# Patient Record
Sex: Male | Born: 1935
Health system: Southern US, Community
[De-identification: ages and names within clinical notes are randomized; demographics above are authoritative.]

## PROBLEM LIST (undated history)

## (undated) DIAGNOSIS — N189 Chronic kidney disease, unspecified: Secondary | ICD-10-CM

## (undated) DIAGNOSIS — I10 Essential (primary) hypertension: Secondary | ICD-10-CM

## (undated) DIAGNOSIS — J449 Chronic obstructive pulmonary disease, unspecified: Secondary | ICD-10-CM

---

## 1999-06-17 ENCOUNTER — Ambulatory Visit (HOSPITAL_COMMUNITY): Admission: RE | Admit: 1999-06-17 | Discharge: 1999-06-17 | Payer: Self-pay | Admitting: Radiation Oncology

## 1999-06-20 ENCOUNTER — Encounter: Admission: RE | Admit: 1999-06-20 | Discharge: 1999-09-18 | Payer: Self-pay | Admitting: Radiation Oncology

## 2000-03-19 ENCOUNTER — Inpatient Hospital Stay (HOSPITAL_COMMUNITY): Admission: EM | Admit: 2000-03-19 | Discharge: 2000-03-20 | Payer: Self-pay | Admitting: Cardiovascular Disease

## 2003-01-28 ENCOUNTER — Inpatient Hospital Stay (HOSPITAL_COMMUNITY): Admission: EM | Admit: 2003-01-28 | Discharge: 2003-01-31 | Payer: Self-pay | Admitting: Emergency Medicine

## 2003-02-06 ENCOUNTER — Encounter: Admission: RE | Admit: 2003-02-06 | Discharge: 2003-02-06 | Payer: Self-pay | Admitting: Internal Medicine

## 2003-02-13 ENCOUNTER — Encounter: Admission: RE | Admit: 2003-02-13 | Discharge: 2003-02-13 | Payer: Self-pay | Admitting: Internal Medicine

## 2003-04-02 ENCOUNTER — Encounter: Admission: RE | Admit: 2003-04-02 | Discharge: 2003-04-02 | Payer: Self-pay | Admitting: Internal Medicine

## 2003-04-04 ENCOUNTER — Emergency Department (HOSPITAL_COMMUNITY): Admission: EM | Admit: 2003-04-04 | Discharge: 2003-04-05 | Payer: Self-pay | Admitting: Emergency Medicine

## 2003-04-05 ENCOUNTER — Encounter: Admission: RE | Admit: 2003-04-05 | Discharge: 2003-04-05 | Payer: Self-pay | Admitting: Internal Medicine

## 2003-05-21 ENCOUNTER — Encounter: Admission: RE | Admit: 2003-05-21 | Discharge: 2003-05-21 | Payer: Self-pay | Admitting: Internal Medicine

## 2003-07-16 ENCOUNTER — Encounter: Admission: RE | Admit: 2003-07-16 | Discharge: 2003-07-16 | Payer: Self-pay | Admitting: Internal Medicine

## 2003-09-10 ENCOUNTER — Encounter: Admission: RE | Admit: 2003-09-10 | Discharge: 2003-09-10 | Payer: Self-pay | Admitting: Internal Medicine

## 2003-12-14 ENCOUNTER — Ambulatory Visit: Payer: Self-pay | Admitting: Internal Medicine

## 2004-04-23 ENCOUNTER — Ambulatory Visit: Payer: Self-pay | Admitting: Internal Medicine

## 2004-05-14 ENCOUNTER — Ambulatory Visit: Payer: Self-pay | Admitting: Internal Medicine

## 2004-07-16 ENCOUNTER — Ambulatory Visit: Payer: Self-pay | Admitting: Internal Medicine

## 2004-08-20 ENCOUNTER — Ambulatory Visit: Payer: Self-pay | Admitting: Internal Medicine

## 2005-02-02 ENCOUNTER — Ambulatory Visit: Payer: Self-pay | Admitting: Internal Medicine

## 2005-02-12 ENCOUNTER — Ambulatory Visit: Payer: Self-pay | Admitting: Internal Medicine

## 2005-02-20 ENCOUNTER — Ambulatory Visit: Payer: Self-pay | Admitting: Internal Medicine

## 2005-03-02 ENCOUNTER — Ambulatory Visit: Payer: Self-pay | Admitting: Internal Medicine

## 2005-05-15 ENCOUNTER — Ambulatory Visit: Payer: Self-pay | Admitting: Internal Medicine

## 2005-05-26 ENCOUNTER — Ambulatory Visit: Payer: Self-pay | Admitting: Hospitalist

## 2005-05-27 ENCOUNTER — Emergency Department (HOSPITAL_COMMUNITY): Admission: EM | Admit: 2005-05-27 | Discharge: 2005-05-27 | Payer: Self-pay | Admitting: Emergency Medicine

## 2005-06-24 ENCOUNTER — Ambulatory Visit: Payer: Self-pay | Admitting: Internal Medicine

## 2005-08-11 ENCOUNTER — Ambulatory Visit: Payer: Self-pay | Admitting: Internal Medicine

## 2005-08-18 ENCOUNTER — Ambulatory Visit: Payer: Self-pay | Admitting: Internal Medicine

## 2005-08-20 ENCOUNTER — Ambulatory Visit: Payer: Self-pay | Admitting: Internal Medicine

## 2005-11-11 ENCOUNTER — Ambulatory Visit: Payer: Self-pay | Admitting: Internal Medicine

## 2005-12-17 DIAGNOSIS — Z8546 Personal history of malignant neoplasm of prostate: Secondary | ICD-10-CM | POA: Insufficient documentation

## 2005-12-17 DIAGNOSIS — I10 Essential (primary) hypertension: Secondary | ICD-10-CM | POA: Insufficient documentation

## 2005-12-17 DIAGNOSIS — J4489 Other specified chronic obstructive pulmonary disease: Secondary | ICD-10-CM | POA: Insufficient documentation

## 2005-12-17 DIAGNOSIS — E119 Type 2 diabetes mellitus without complications: Secondary | ICD-10-CM | POA: Insufficient documentation

## 2005-12-17 DIAGNOSIS — G5600 Carpal tunnel syndrome, unspecified upper limb: Secondary | ICD-10-CM | POA: Insufficient documentation

## 2005-12-17 DIAGNOSIS — N259 Disorder resulting from impaired renal tubular function, unspecified: Secondary | ICD-10-CM | POA: Insufficient documentation

## 2005-12-17 DIAGNOSIS — J449 Chronic obstructive pulmonary disease, unspecified: Secondary | ICD-10-CM | POA: Insufficient documentation

## 2006-03-31 DIAGNOSIS — E663 Overweight: Secondary | ICD-10-CM | POA: Insufficient documentation

## 2006-03-31 DIAGNOSIS — E785 Hyperlipidemia, unspecified: Secondary | ICD-10-CM | POA: Insufficient documentation

## 2006-05-16 ENCOUNTER — Emergency Department (HOSPITAL_COMMUNITY): Admission: EM | Admit: 2006-05-16 | Discharge: 2006-05-16 | Payer: Self-pay | Admitting: Emergency Medicine

## 2006-06-04 ENCOUNTER — Emergency Department (HOSPITAL_COMMUNITY): Admission: EM | Admit: 2006-06-04 | Discharge: 2006-06-04 | Payer: Self-pay | Admitting: Emergency Medicine

## 2006-07-26 ENCOUNTER — Telehealth: Payer: Self-pay | Admitting: *Deleted

## 2006-08-16 ENCOUNTER — Telehealth: Payer: Self-pay | Admitting: *Deleted

## 2006-08-17 ENCOUNTER — Telehealth (INDEPENDENT_AMBULATORY_CARE_PROVIDER_SITE_OTHER): Payer: Self-pay | Admitting: *Deleted

## 2006-08-18 ENCOUNTER — Encounter (INDEPENDENT_AMBULATORY_CARE_PROVIDER_SITE_OTHER): Payer: Self-pay | Admitting: *Deleted

## 2007-12-22 ENCOUNTER — Ambulatory Visit (HOSPITAL_COMMUNITY): Admission: RE | Admit: 2007-12-22 | Discharge: 2007-12-22 | Payer: Self-pay | Admitting: Nephrology

## 2010-08-01 NOTE — Discharge Summary (Signed)
NAME:  Nicholas Swanson, Nicholas Swanson                          ACCOUNT NO.:  [id number]   MEDICAL RECORD NO.:  [id number]                   PATIENT TYPE:  INP   LOCATION:  [id number]                                 FACILITY:  [facility]   PHYSICIAN:  Jacquelynn Cree, M.D.                DATE OF BIRTH:  [birth year]   DATE OF ADMISSION:  01/28/2003  DATE OF DISCHARGE:  01/31/2003                                 DISCHARGE SUMMARY   DISCHARGE DIAGNOSES:  1. Chronic obstructive pulmonary disease with acute exacerbation.  2. Hypertension.  3. Type II diabetes mellitus.  4. Anemia.  5. History of prostate cancer.  6. Chronic renal insufficiency.   DISCHARGE MEDICATIONS:  1. Advair discus 250/50 one puff b.i.d.  2. Prednisone 50 mg p.o. q.d. times one more day.  3. Albuterol meter dose inhaler two puffs p.r.n. wheezing.  4. Atrovent meter dose inhaler q.6h. p.r.n.  5. Glucotrol XL q.d.  6. Hyzaar q.d.   BRIEF ADMISSION HISTORY AND PHYSICAL:  Nicholas Swanson is a 77 year old male who  awoke with shortness of breath the morning of admission.  As the morning  progressed, he had increasing shortness of breath, prompting his wife to  call EMS.  Upon arrival of EMS, the patient was too short of breath to  speak.  He was put on 15 liters of oxygen by mask and was felt to be moving  very little air and using his accessory muscles to breath.  He was  transported to the emergency department and admitted for treatment of acute  exacerbation of COPD.   PHYSICAL EXAMINATION:  VITAL SIGNS:  Temperature 97.5, pulse 104,  respirations 18, blood pressure 120/63, O2 saturation 100% on BiPAP.  GENERAL:  A well-developed African-American male in moderate respiratory  distress.  HEENT:  Normocephalic, atraumatic.  PERRL.  EOMI.  Oropharynx clear.  NECK:  Supple, trachea is midline.  There are no bruits or thyromegaly.  CHEST:  Distant breath sounds with poor air movement.  There are bilateral  expiratory wheezes.  HEART:  Tachycardiac  with regular rhythm.  ABDOMEN:  Soft, nontender, nondistended, with normal active bowel sounds.  There were no masses.  EXTREMITIES:  Edema bilaterally.  NEUROLOGICAL:  The patient is alert and oriented times three.  Neurological  exam is nonfocal.   ADMISSION LABORATORY DATA:  Initial ABG revealed a pH of 7.291, PCO2 48.1,  PO2 397 with a bicarb of 24 on 100% oxygen.  His white count was 6.8,  hemoglobin 11.8, hematocrit 34.6, and platelet count 226.  PT was 13.2, INR  1.0, PTT 24, sodium was 133, potassium 4.7, chloride 108, bicarb 22, glucose  248, BUN 23, creatinine 1.4, calcium 8.3, total protein 6.5, albumin 3.4,  AST 15, ALT 15, alkaline phosphatase 70, total bilirubin 0.3.  Glycosylated  hemoglobin was 8.1.  Total CK 395, CKMB 5.9, troponin-I 0.03.  Brain  natriuretic peptide less than 30.  Cholesterol was 225, triglycerides 58,  HDL 58, LDL 155, TSH was 0.222, B12 363, ferritin 115.  Urinalysis was  unremarkable except for some glucosuria and trace hemoglobin.   HOSPITAL COURSE BY PROBLEM:  PROBLEM 1.  COPD exacerbation:  The patient was  admitted and supported with aggressive oxygen therapy including C-PAP.  He  was treated with IV steroids and around-the-clock frequent bronchodilator  therapy.  Because his presentation was not consistent with pneumonia or  infection, he was not initially started on any antibiotics.  He had not been  on a long-acting bronchodilator or steroid inhaler prior to his admission  and these therapies were initiated while in the hospital.  He rapidly  improved with Solu-Medrol therapy.  He was given a pneumococcal vaccine on  January 30, 2003.  His return to baseline was achieved by January 31, 2003, and he was sent home in good condition.  PROBLEM 2.  Hypertension:  The patient was admitted on both an angiotensin  receptor blocker and an angiotensin enzyme inhibitor.  His regimen was  changed secondary to his elevation of creatinine.  He was,  therefore,  started on Norvasc and Lotensin to achieve blood pressure control.  PROBLEM 3.  Renal insufficiency:  It was felt that this may be exacerbated  by his medical regimen for hypertension.  His medicines were changed and he  will need to be followed on an outpatient basis to see if his creatinine  returns to a lower value.  PROBLEM 4.  Anemia:  The patient was not found to be iron deficient given  his normal ferritin level.  This will need to be worked up further on an  outpatient basis.  PROBLEM 5.  Low TSH:  Difficult to assess given his acute illness.  He will  need a follow up TSH drawn on an outpatient basis.  He was clinically not  hyperthyroid.  PROBLEM 6.  Diabetes:  The patient's glycemia control was suboptimal with a  hemoglobin A1c of 8.1.  It will be followed on an outpatient basis to  maximize his diabetic control.  PROBLEM 7.  Lipidemia:  The patient was advised on dietary measures.  He  will follow up in the outpatient clinic to see if statin therapy is  warranted.   DISCHARGE INSTRUCTIONS:  The patient was advised to consume a diabetic diet.  He will follow up in the outpatient clinic on February 06, 2003, at 9  o'clock for lab work and see Dr. Linus Swanson at 3:00 PM on February 13, 2003, for  hospital follow up.                                                Jacquelynn Cree, M.D.    CR/MEDQ  D:  06/06/2003  T:  06/07/2003  Job:  [id number]

## 2010-08-01 NOTE — Discharge Summary (Signed)
. [facility]  Patient:    Nicholas Swanson, Nicholas Swanson                       MRN: [id number] Adm. Date:  [id number] Disc. Date: 03/20/00 Attending:  Berry, Jonathan Swanson Dictator:   Nicholas Swanson. Dorene Ar, F.N.P.C. CC:         Nicholas Simmers, M.D., c/o [facility], [place], [place]   Discharge Summary  DISCHARGE DIAGNOSES: 1. Chest pain, nonspecific. 2. Hypertension, uncontrolled, now improved. 3. Non-insulin-dependent diabetic. 4. Chronic obstructive pulmonary disease. 5. History of prostate cancer.  DISCHARGE CONDITION:  Improved.  PROCEDURES:  None.  DISCHARGE MEDICATIONS: 1. GlucoVance 5/500 one twice a day before meals. 2. ______ 20-12.5 one daily. 3. Maxair inhaler as needed. 4. Uniphyl 400 mg one at bedtime. 5. Norvasc 5 mg one daily.  This is new.  DISCHARGE INSTRUCTIONS: 1. No strenuous activity until treadmill test completed. 2. Low-fat, low-salt, diabetic diet. 3. The night before treadmill, do not eat or drink after midnight and do not    take diabetic medicine until after the test. 4. Call Nicholas Swanson office Monday for an appointment for stress Cardiolite and    a follow-up appointment with Nicholas Swanson.  HISTORY OF PRESENT ILLNESS:  A 77 year old married black male patient of Dr.  Laneta Simmers presented to [facility] hospital on March 19, 2000, after having chest pain at 9 a.m. lasting maybe 10 or 15 minutes, was more of a numbness and tingling in his left anterior chest.  Mild nausea associated and his wife states he was very weak or not as active over the last couple of days.  Due to hypertension, he was placed on nitroglycerin drip and transferred to [facility].  Patient has no prior cardiac history, no other associated symptoms.  Patient felt he was not cardiac, he felt fine and really only presented to the emergency room secondary to his wifes insistence.  PAST MEDICAL HISTORY: 1. Cardiac:  No prior history. 2. COPD with  disability from [facility] in 1991 secondary to his COPD. 3. Hypertension for two years. 4. Non-insulin-dependent diabetes mellitus, type 2. 5. History of prostate cancer.  ALLERGIES:  No known allergies.  OUTPATIENT MEDICATIONS: 1. GlucoVance 5/500 twice a day. 2. ______ 20/12.5 daily. 3. Maxair inhaler p.r.n. 4. Uniphyl 400 at h.s.  SOCIAL HISTORY:  No tobacco for six years.  Disabled from [facility] as stated.  He is married with seven children, 13 grandchildren, eight great grandchildren.  FAMILY HISTORY:  No cardiac disease.  REVIEW OF SYSTEMS:  Musculoskeletal:  Has not been shoveling snow, but he did carry a 50-pound bag of bird seed the day of the discomfort.  Cardiovascular: Positive for hypertension, no coronary disease.  Lungs:  History of COPD and tobacco use.  GI:  Some mild nausea today but no diarrhea, no constipation, and no melena.  GU:  History of prostate cancer.  Endocrine:  Positive diabetes, negative thyroid disease.  PHYSICAL EXAMINATION AT DISCHARGE:  VITAL SIGNS:  Blood pressure 140/72, pulse 86, respirations 22, temperature 99.6, oxygen saturation on room air 96%.  GENERAL:  Alert, oriented black male in no acute distress.  NECK:  Supple, midline trachea without JVD, bruit, or thyromegaly.  LUNGS:  Clear to auscultation and percussion.  ABDOMEN:  Soft, positive bowel sounds, nontender.  SKIN:  Warm and dry without jaundice.  CARDIAC:  Regular rate and rhythm.  No murmur, gallop, rub, or click.  EXTREMITIES:  Moves all extremities x 4.  No ankle edema.  LABORATORY VALUES:  Initial labs at [facility]:  Hemoglobin 13, hematocrit 37, platelets 254, WBC 8.5, neutrophils 48, lymphs 37.  PTT 23, pro time 12, INR 0.9, troponin 0.0, CK-MB 6.  Theophylline 1.2, sodium 137, potassium 4.5, BUN 18, creatinine 1.6, glucose 172.  LFTs were normal.  EKG:  Sinus rhythm, rate of 92.  Please note, lipid panel is pending.  Follow-up labs:  Hemoglobin 12,  hematocrit 34, creatinine down to 1.4, potassium 4.3, glucose 153, WBC 7.8.  EKG without acute changes.  HOSPITAL COURSE:  Nicholas Swanson was transferred from [facility] with chest pain and uncontrolled hypertension.  He was admitted to 3700 telemetry. Cardiac enzymes were done which were all negative.  Lipid panel was pending. He was on IV nitroglycerin for blood pressure control.  Norvasc was added with first dose given the night of admission.  By the next morning, nitroglycerin was weaned and discontinued.  Blood pressure was controlled at 140/72.  He was able to ambulate in the hall without difficulty.  He was discharged home by Dr. Claiborne Swanson and would follow up as an outpatient.  He will have a stress test done next week. DD:  03/20/00 TD:  03/20/00 Job: [id number]

## 2011-04-12 ENCOUNTER — Other Ambulatory Visit: Payer: Self-pay

## 2011-04-12 ENCOUNTER — Encounter (HOSPITAL_COMMUNITY): Payer: Self-pay | Admitting: *Deleted

## 2011-04-12 ENCOUNTER — Emergency Department (HOSPITAL_COMMUNITY)
Admission: EM | Admit: 2011-04-12 | Discharge: 2011-04-12 | Disposition: A | Discharge status: Home / Self Care | Payer: Medicare Other | Attending: Emergency Medicine | Admitting: Emergency Medicine

## 2011-04-12 ENCOUNTER — Emergency Department (HOSPITAL_COMMUNITY): Payer: Medicare Other

## 2011-04-12 DIAGNOSIS — J209 Acute bronchitis, unspecified: Secondary | ICD-10-CM | POA: Insufficient documentation

## 2011-04-12 DIAGNOSIS — J9801 Acute bronchospasm: Secondary | ICD-10-CM

## 2011-04-12 DIAGNOSIS — I10 Essential (primary) hypertension: Secondary | ICD-10-CM | POA: Insufficient documentation

## 2011-04-12 DIAGNOSIS — E119 Type 2 diabetes mellitus without complications: Secondary | ICD-10-CM | POA: Insufficient documentation

## 2011-04-12 DIAGNOSIS — J40 Bronchitis, not specified as acute or chronic: Secondary | ICD-10-CM

## 2011-04-12 HISTORY — DX: Chronic obstructive pulmonary disease, unspecified: J44.9

## 2011-04-12 HISTORY — DX: Essential (primary) hypertension: I10

## 2011-04-12 LAB — CBC
HCT: 33.4 % — ABNORMAL LOW (ref 39.0–52.0)
Hemoglobin: 11.2 g/dL — ABNORMAL LOW (ref 13.0–17.0)
MCH: 30.1 pg (ref 26.0–34.0)
MCHC: 33.5 g/dL (ref 30.0–36.0)
MCV: 89.8 fL (ref 78.0–100.0)
RDW: 13.7 % (ref 11.5–15.5)

## 2011-04-12 LAB — COMPREHENSIVE METABOLIC PANEL
AST: 17 U/L (ref 0–37)
Albumin: 4 g/dL (ref 3.5–5.2)
BUN: 20 mg/dL (ref 6–23)
Calcium: 9.6 mg/dL (ref 8.4–10.5)
Chloride: 100 mEq/L (ref 96–112)
Creatinine, Ser: 1.54 mg/dL — ABNORMAL HIGH (ref 0.50–1.35)
GFR calc non Af Amer: 41 mL/min — ABNORMAL LOW (ref >90)
Total Bilirubin: 0.5 mg/dL (ref 0.3–1.2)

## 2011-04-12 LAB — POCT I-STAT TROPONIN I: Troponin i, poc: 0.01 ng/mL (ref 0.00–0.08)

## 2011-04-12 LAB — DIFFERENTIAL
Basophils Absolute: 0 10*3/uL (ref 0.0–0.1)
Basophils Relative: 1 % (ref 0–1)
Eosinophils Relative: 7 % — ABNORMAL HIGH (ref 0–5)
Monocytes Absolute: 0.9 10*3/uL (ref 0.1–1.0)
Monocytes Relative: 11 % (ref 3–12)
Neutro Abs: 5.4 10*3/uL (ref 1.7–7.7)

## 2011-04-12 MED ORDER — IPRATROPIUM BROMIDE 0.02 % IN SOLN
0.5000 mg | Freq: Once | RESPIRATORY_TRACT | Status: AC
  Administered 2011-04-12: 0.5 mg via RESPIRATORY_TRACT
  Filled 2011-04-12: qty 2.5

## 2011-04-12 MED ORDER — ALBUTEROL SULFATE (5 MG/ML) 0.5% IN NEBU
5.0000 mg | INHALATION_SOLUTION | Freq: Once | RESPIRATORY_TRACT | Status: AC
  Administered 2011-04-12: 5 mg via RESPIRATORY_TRACT
  Filled 2011-04-12: qty 1

## 2011-04-12 MED ORDER — METHYLPREDNISOLONE SODIUM SUCC 125 MG IJ SOLR
125.0000 mg | Freq: Once | INTRAMUSCULAR | Status: AC
  Administered 2011-04-12: 125 mg via INTRAVENOUS
  Filled 2011-04-12: qty 2

## 2011-04-12 MED ORDER — PREDNISONE 10 MG PO TABS: 20.0000 mg | ORAL_TABLET | Freq: Every day | ORAL | Status: DC

## 2011-04-12 MED ORDER — AZITHROMYCIN 250 MG PO TABS: ORAL_TABLET | ORAL | Status: DC

## 2011-04-12 MED ORDER — IPRATROPIUM BROMIDE 0.02 % IN SOLN
0.5000 mg | Freq: Once | RESPIRATORY_TRACT | Status: AC
Start: 2011-04-12 — End: 2011-04-12
  Administered 2011-04-12: 0.5 mg via RESPIRATORY_TRACT
  Filled 2011-04-12: qty 2.5

## 2011-04-12 NOTE — ED Provider Notes (Cosign Needed)
History   This chart was scribed for Maudry Diego, MD by Kathreen Cornfield. The patient was seen in room APA14/APA14 and the patient's care was started at 4:03PM.    CSN: [id number]  Arrival date & time 04/12/11  1118   First MD Initiated Contact with Patient 04/12/11 1600      Chief Complaint  Patient presents with  . Shortness of Breath  . Cough    (Consider location/radiation/quality/duration/timing/severity/associated sxs/prior treatment) Patient is a 78 y.o. male presenting with shortness of breath and cough. The history is provided by the patient. No language interpreter was used.  Shortness of Breath  The current episode started yesterday. The onset was sudden. The problem occurs rarely. The problem has been unchanged. The problem is moderate. The symptoms are relieved by rest. The symptoms are aggravated by activity. Associated symptoms include cough, shortness of breath and wheezing. Pertinent negatives include no chest pain, no fever and no rhinorrhea. The Heimlich maneuver was not attempted. Recently, medical care has been given at this facility.  Cough This is a new problem. The current episode started yesterday. The problem has not changed since onset.There has been no fever. Associated symptoms include shortness of breath and wheezing. Pertinent negatives include no chest pain, no headaches and no rhinorrhea.    Past Medical History  Diagnosis Date  . Hypertension   . COPD (chronic obstructive pulmonary disease)   . Diabetes mellitus     History reviewed. No pertinent past surgical history.  History reviewed. No pertinent family history.  History  Substance Use Topics  . Smoking status: Former Research scientist (life sciences)  . Smokeless tobacco: Not on file  . Alcohol Use: No    10 Systems reviewed and are negative for acute change except as noted in the HPI.   Review of Systems  Constitutional: Negative for fever and fatigue.  HENT: Negative for congestion, rhinorrhea, sinus pressure  and ear discharge.   Eyes: Negative for discharge.  Respiratory: Positive for cough, shortness of breath and wheezing.   Cardiovascular: Negative for chest pain.  Gastrointestinal: Negative for abdominal pain and diarrhea.  Genitourinary: Negative for frequency and hematuria.  Musculoskeletal: Negative for back pain.  Skin: Negative for rash.  Neurological: Negative for seizures and headaches.  Hematological: Negative.   Psychiatric/Behavioral: Negative for hallucinations.  All other systems reviewed and are negative.    Allergies  Ace inhibitors and Simvastatin  Home Medications   Current Outpatient Rx  Name Route Sig Dispense Refill  . AMLODIPINE BESYLATE 5 MG PO TABS Oral Take 5 mg by mouth daily.    . ASPIRIN EC 81 MG PO TBEC Oral Take 81 mg by mouth daily.    . ATORVASTATIN CALCIUM 40 MG PO TABS Oral Take 40 mg by mouth daily.    Marland Kitchen GLIPIZIDE ER 2.5 MG PO TB24 Oral Take 2.5 mg by mouth daily.    Marland Kitchen METFORMIN HCL 1000 MG PO TABS Oral Take 1,000 mg by mouth 2 (two) times daily with a meal.    . METOPROLOL TARTRATE 50 MG PO TABS Oral Take 50 mg by mouth daily.    Marland Kitchen TERAZOSIN HCL 5 MG PO CAPS Oral Take 5 mg by mouth at bedtime.    Marland Kitchen VALSARTAN-HYDROCHLOROTHIAZIDE 160-25 MG PO TABS Oral Take 1 tablet by mouth daily.      BP 154/78  Pulse 70  Temp(Src) 98.3 F (36.8 C) (Oral)  Resp 18  Ht 6\' 1"  (1.854 m)  Wt 198 lb (89.812 kg)  BMI 26.12  kg/m2  SpO2 99%  Physical Exam  Nursing note and vitals reviewed. Constitutional: He is oriented to person, place, and time. He appears well-developed.  HENT:  Head: Normocephalic and atraumatic.  Eyes: Conjunctivae and EOM are normal. No scleral icterus.  Neck: Neck supple. No thyromegaly present.  Cardiovascular: Normal rate and regular rhythm.  Exam reveals no gallop and no friction rub.   No murmur heard. Pulmonary/Chest: No stridor. He has wheezes (Moderate throughout.). He has no rales. He exhibits no tenderness.  Abdominal: He  exhibits no distension. There is no tenderness. There is no rebound.  Musculoskeletal: Normal range of motion. He exhibits no edema.  Lymphadenopathy:    He has no cervical adenopathy.  Neurological: He is oriented to person, place, and time. Coordination normal.  Skin: No rash noted. No erythema.  Psychiatric: He has a normal mood and affect. His behavior is normal.    ED Course  Procedures (including critical care time)  DIAGNOSTIC STUDIES: Oxygen Saturation is 99% on room air, normal by my interpretation.    COORDINATION OF CARE:   Results for orders placed during the hospital encounter of 04/12/11  CBC      Component Value Range   WBC 8.4  4.0 - 10.5 (K/uL)   RBC 3.72 (*) 4.22 - 5.81 (MIL/uL)   Hemoglobin 11.2 (*) 13.0 - 17.0 (g/dL)   HCT 33.4 (*) 39.0 - 52.0 (%)   MCV 89.8  78.0 - 100.0 (fL)   MCH 30.1  26.0 - 34.0 (pg)   MCHC 33.5  30.0 - 36.0 (g/dL)   RDW 13.7  11.5 - 15.5 (%)   Platelets 244  150 - 400 (K/uL)  DIFFERENTIAL      Component Value Range   Neutrophils Relative 64  43 - 77 (%)   Neutro Abs 5.4  1.7 - 7.7 (K/uL)   Lymphocytes Relative 18  12 - 46 (%)   Lymphs Abs 1.5  0.7 - 4.0 (K/uL)   Monocytes Relative 11  3 - 12 (%)   Monocytes Absolute 0.9  0.1 - 1.0 (K/uL)   Eosinophils Relative 7 (*) 0 - 5 (%)   Eosinophils Absolute 0.6  0.0 - 0.7 (K/uL)   Basophils Relative 1  0 - 1 (%)   Basophils Absolute 0.0  0.0 - 0.1 (K/uL)  COMPREHENSIVE METABOLIC PANEL      Component Value Range   Sodium 133 (*) 135 - 145 (mEq/L)   Potassium 4.9  3.5 - 5.1 (mEq/L)   Chloride 100  96 - 112 (mEq/L)   CO2 24  19 - 32 (mEq/L)   Glucose, Bld 96  70 - 99 (mg/dL)   BUN 20  6 - 23 (mg/dL)   Creatinine, Ser 1.54 (*) 0.50 - 1.35 (mg/dL)   Calcium 9.6  8.4 - 10.5 (mg/dL)   Total Protein 7.8  6.0 - 8.3 (g/dL)   Albumin 4.0  3.5 - 5.2 (g/dL)   AST 17  0 - 37 (U/L)   ALT 12  0 - 53 (U/L)   Alkaline Phosphatase 71  39 - 117 (U/L)   Total Bilirubin 0.5  0.3 - 1.2 (mg/dL)    GFR calc non Af Amer 41 (*) >90 (mL/min)   GFR calc Af Amer 48 (*) >90 (mL/min)  POCT I-STAT TROPONIN I      Component Value Range   Troponin i, poc 0.01  0.00 - 0.08 (ng/mL)   Comment 3  Dg Chest Portable 1 View  04/12/2011  *RADIOLOGY REPORT*  Clinical Data: Shortness of breath  PORTABLE CHEST - 1 VIEW  Comparison: 06/04/2006  Findings: The heart size and mediastinal contours are within normal limits.  Both lungs are clear.  The visualized skeletal structures are unremarkable.  IMPRESSION: Negative exam.  Original Report Authenticated By: Angelita Ingles, M.D.        MDM     4:02PM- EDP at bedside discusses treatment plan. 5:43PM- Recheck. EDP at bedside reports moderate relief, moderate wheezing. EDP discusses treatment plan. 6:45PM- Recheck. EDP at bedside discusses treatment plan. Pt improved with tx  The chart was scribed for me under my direct supervision.  I personally performed the history, physical, and medical decision making and all procedures in the evaluation of this patient.Maudry Diego, MD 04/12/11 [phone]

## 2011-04-12 NOTE — ED Notes (Signed)
Patient with no complaints at this time. Respirations even and unlabored. Skin warm/dry. Discharge instructions reviewed with patient at this time. Patient given opportunity to voice concerns/ask questions. IV removed per policy and band-aid applied to site. Patient discharged at this time and left Emergency Department with steady gait.  

## 2014-05-29 DIAGNOSIS — J449 Chronic obstructive pulmonary disease, unspecified: Secondary | ICD-10-CM | POA: Diagnosis not present

## 2014-05-29 DIAGNOSIS — C61 Malignant neoplasm of prostate: Secondary | ICD-10-CM | POA: Diagnosis not present

## 2014-05-29 DIAGNOSIS — E1122 Type 2 diabetes mellitus with diabetic chronic kidney disease: Secondary | ICD-10-CM | POA: Diagnosis not present

## 2014-05-29 DIAGNOSIS — I10 Essential (primary) hypertension: Secondary | ICD-10-CM | POA: Diagnosis not present

## 2014-06-12 DIAGNOSIS — D075 Carcinoma in situ of prostate: Secondary | ICD-10-CM | POA: Diagnosis not present

## 2014-06-12 DIAGNOSIS — I10 Essential (primary) hypertension: Secondary | ICD-10-CM | POA: Diagnosis not present

## 2014-06-12 DIAGNOSIS — E1122 Type 2 diabetes mellitus with diabetic chronic kidney disease: Secondary | ICD-10-CM | POA: Diagnosis not present

## 2014-06-22 DIAGNOSIS — E875 Hyperkalemia: Secondary | ICD-10-CM | POA: Diagnosis not present

## 2014-06-29 DIAGNOSIS — E875 Hyperkalemia: Secondary | ICD-10-CM | POA: Diagnosis not present

## 2014-07-10 DIAGNOSIS — E875 Hyperkalemia: Secondary | ICD-10-CM | POA: Diagnosis not present

## 2014-11-05 DIAGNOSIS — C61 Malignant neoplasm of prostate: Secondary | ICD-10-CM | POA: Diagnosis not present

## 2014-11-05 DIAGNOSIS — J449 Chronic obstructive pulmonary disease, unspecified: Secondary | ICD-10-CM | POA: Diagnosis not present

## 2014-11-05 DIAGNOSIS — E1122 Type 2 diabetes mellitus with diabetic chronic kidney disease: Secondary | ICD-10-CM | POA: Diagnosis not present

## 2014-11-05 DIAGNOSIS — I10 Essential (primary) hypertension: Secondary | ICD-10-CM | POA: Diagnosis not present

## 2015-03-05 DIAGNOSIS — N183 Chronic kidney disease, stage 3 (moderate): Secondary | ICD-10-CM | POA: Diagnosis not present

## 2015-03-05 DIAGNOSIS — E1122 Type 2 diabetes mellitus with diabetic chronic kidney disease: Secondary | ICD-10-CM | POA: Diagnosis not present

## 2015-03-05 DIAGNOSIS — J449 Chronic obstructive pulmonary disease, unspecified: Secondary | ICD-10-CM | POA: Diagnosis not present

## 2015-03-05 DIAGNOSIS — Z6827 Body mass index (BMI) 27.0-27.9, adult: Secondary | ICD-10-CM | POA: Diagnosis not present

## 2015-07-08 DIAGNOSIS — N183 Chronic kidney disease, stage 3 (moderate): Secondary | ICD-10-CM | POA: Diagnosis not present

## 2015-07-08 DIAGNOSIS — I10 Essential (primary) hypertension: Secondary | ICD-10-CM | POA: Diagnosis not present

## 2015-07-08 DIAGNOSIS — E1122 Type 2 diabetes mellitus with diabetic chronic kidney disease: Secondary | ICD-10-CM | POA: Diagnosis not present

## 2015-07-08 DIAGNOSIS — J449 Chronic obstructive pulmonary disease, unspecified: Secondary | ICD-10-CM | POA: Diagnosis not present

## 2016-01-27 DIAGNOSIS — J449 Chronic obstructive pulmonary disease, unspecified: Secondary | ICD-10-CM | POA: Diagnosis not present

## 2016-01-27 DIAGNOSIS — I10 Essential (primary) hypertension: Secondary | ICD-10-CM | POA: Diagnosis not present

## 2016-01-27 DIAGNOSIS — N183 Chronic kidney disease, stage 3 (moderate): Secondary | ICD-10-CM | POA: Diagnosis not present

## 2016-01-27 DIAGNOSIS — E1122 Type 2 diabetes mellitus with diabetic chronic kidney disease: Secondary | ICD-10-CM | POA: Diagnosis not present

## 2016-02-03 DIAGNOSIS — E875 Hyperkalemia: Secondary | ICD-10-CM | POA: Diagnosis not present

## 2016-02-26 DIAGNOSIS — E1122 Type 2 diabetes mellitus with diabetic chronic kidney disease: Secondary | ICD-10-CM | POA: Diagnosis not present

## 2016-02-26 DIAGNOSIS — E875 Hyperkalemia: Secondary | ICD-10-CM | POA: Diagnosis not present

## 2016-02-26 DIAGNOSIS — N183 Chronic kidney disease, stage 3 (moderate): Secondary | ICD-10-CM | POA: Diagnosis not present

## 2016-02-26 DIAGNOSIS — Z0000 Encounter for general adult medical examination without abnormal findings: Secondary | ICD-10-CM | POA: Diagnosis not present

## 2016-02-26 DIAGNOSIS — E876 Hypokalemia: Secondary | ICD-10-CM | POA: Diagnosis not present

## 2016-03-10 DIAGNOSIS — I10 Essential (primary) hypertension: Secondary | ICD-10-CM | POA: Diagnosis not present

## 2016-03-10 DIAGNOSIS — N183 Chronic kidney disease, stage 3 (moderate): Secondary | ICD-10-CM | POA: Diagnosis not present

## 2016-03-10 DIAGNOSIS — E1122 Type 2 diabetes mellitus with diabetic chronic kidney disease: Secondary | ICD-10-CM | POA: Diagnosis not present

## 2016-03-10 DIAGNOSIS — E875 Hyperkalemia: Secondary | ICD-10-CM | POA: Diagnosis not present

## 2016-03-24 DIAGNOSIS — E875 Hyperkalemia: Secondary | ICD-10-CM | POA: Diagnosis not present

## 2016-06-08 DIAGNOSIS — I10 Essential (primary) hypertension: Secondary | ICD-10-CM | POA: Diagnosis not present

## 2016-06-08 DIAGNOSIS — N183 Chronic kidney disease, stage 3 (moderate): Secondary | ICD-10-CM | POA: Diagnosis not present

## 2016-06-08 DIAGNOSIS — E1129 Type 2 diabetes mellitus with other diabetic kidney complication: Secondary | ICD-10-CM | POA: Diagnosis not present

## 2016-09-08 DIAGNOSIS — E118 Type 2 diabetes mellitus with unspecified complications: Secondary | ICD-10-CM | POA: Diagnosis not present

## 2016-09-08 DIAGNOSIS — I10 Essential (primary) hypertension: Secondary | ICD-10-CM | POA: Diagnosis not present

## 2016-09-08 DIAGNOSIS — C61 Malignant neoplasm of prostate: Secondary | ICD-10-CM | POA: Diagnosis not present

## 2016-09-08 DIAGNOSIS — J449 Chronic obstructive pulmonary disease, unspecified: Secondary | ICD-10-CM | POA: Diagnosis not present

## 2016-09-10 DIAGNOSIS — E118 Type 2 diabetes mellitus with unspecified complications: Secondary | ICD-10-CM | POA: Diagnosis not present

## 2016-09-10 DIAGNOSIS — I10 Essential (primary) hypertension: Secondary | ICD-10-CM | POA: Diagnosis not present

## 2016-12-07 DIAGNOSIS — C61 Malignant neoplasm of prostate: Secondary | ICD-10-CM | POA: Diagnosis not present

## 2016-12-07 DIAGNOSIS — E1122 Type 2 diabetes mellitus with diabetic chronic kidney disease: Secondary | ICD-10-CM | POA: Diagnosis not present

## 2016-12-07 DIAGNOSIS — Z23 Encounter for immunization: Secondary | ICD-10-CM | POA: Diagnosis not present

## 2016-12-07 DIAGNOSIS — N183 Chronic kidney disease, stage 3 (moderate): Secondary | ICD-10-CM | POA: Diagnosis not present

## 2016-12-07 DIAGNOSIS — I10 Essential (primary) hypertension: Secondary | ICD-10-CM | POA: Diagnosis not present

## 2018-03-22 DIAGNOSIS — I10 Essential (primary) hypertension: Secondary | ICD-10-CM | POA: Diagnosis not present

## 2018-03-22 DIAGNOSIS — E118 Type 2 diabetes mellitus with unspecified complications: Secondary | ICD-10-CM | POA: Diagnosis not present

## 2018-03-22 DIAGNOSIS — N183 Chronic kidney disease, stage 3 (moderate): Secondary | ICD-10-CM | POA: Diagnosis not present

## 2018-03-22 DIAGNOSIS — J449 Chronic obstructive pulmonary disease, unspecified: Secondary | ICD-10-CM | POA: Diagnosis not present

## 2018-08-10 ENCOUNTER — Other Ambulatory Visit: Payer: Self-pay

## 2018-08-10 NOTE — Patient Outreach (Signed)
[facility]) Care Management  08/10/2018  Nicholas Swanson June 27, 1930 [id number]   Medication Adherence call to [name] Compliant Voice message left with a call back number. [name] is showing past due on Glipizide Er 2.5 mg under [place].   [place] Management Direct Dial [phone]  Fax [phone] [email or web address]

## 2018-10-17 DIAGNOSIS — N183 Chronic kidney disease, stage 3 (moderate): Secondary | ICD-10-CM | POA: Diagnosis not present

## 2018-10-17 DIAGNOSIS — J449 Chronic obstructive pulmonary disease, unspecified: Secondary | ICD-10-CM | POA: Diagnosis not present

## 2018-10-17 DIAGNOSIS — E1169 Type 2 diabetes mellitus with other specified complication: Secondary | ICD-10-CM | POA: Diagnosis not present

## 2018-10-17 DIAGNOSIS — N182 Chronic kidney disease, stage 2 (mild): Secondary | ICD-10-CM | POA: Diagnosis not present

## 2018-10-17 DIAGNOSIS — I10 Essential (primary) hypertension: Secondary | ICD-10-CM | POA: Diagnosis not present

## 2018-10-17 DIAGNOSIS — Z7189 Other specified counseling: Secondary | ICD-10-CM | POA: Diagnosis not present

## 2018-12-22 DIAGNOSIS — Z03818 Encounter for observation for suspected exposure to other biological agents ruled out: Secondary | ICD-10-CM | POA: Diagnosis not present

## 2019-02-20 ENCOUNTER — Other Ambulatory Visit: Payer: Self-pay

## 2019-02-20 DIAGNOSIS — E1122 Type 2 diabetes mellitus with diabetic chronic kidney disease: Secondary | ICD-10-CM | POA: Diagnosis not present

## 2019-02-20 DIAGNOSIS — Z7189 Other specified counseling: Secondary | ICD-10-CM | POA: Diagnosis not present

## 2019-02-20 DIAGNOSIS — N183 Chronic kidney disease, stage 3 unspecified: Secondary | ICD-10-CM | POA: Diagnosis not present

## 2019-02-20 DIAGNOSIS — J449 Chronic obstructive pulmonary disease, unspecified: Secondary | ICD-10-CM | POA: Diagnosis not present

## 2019-02-20 DIAGNOSIS — I10 Essential (primary) hypertension: Secondary | ICD-10-CM | POA: Diagnosis not present

## 2019-02-20 NOTE — Patient Outreach (Signed)
[facility]) Care Management  02/20/2019  JAWAD WIACEK 1930/04/28 [id number]   Medication Adherence call to Mr. Pierre Bali Telephone call to Patient regarding Medication Adherence unable to reach patient. Mr. Maalouf is showing past due on Telmisartan 40 mg under [place].   [place] Management Direct Dial [phone]  Fax [phone] [email or web address]

## 2019-02-28 DIAGNOSIS — E876 Hypokalemia: Secondary | ICD-10-CM | POA: Diagnosis not present

## 2019-03-04 ENCOUNTER — Encounter (HOSPITAL_COMMUNITY): Payer: Self-pay | Admitting: Emergency Medicine

## 2019-03-04 ENCOUNTER — Other Ambulatory Visit: Payer: Self-pay

## 2019-03-04 ENCOUNTER — Emergency Department (HOSPITAL_COMMUNITY): Payer: Medicare Other

## 2019-03-04 ENCOUNTER — Emergency Department (HOSPITAL_COMMUNITY)
Admission: EM | Admit: 2019-03-04 | Discharge: 2019-03-04 | Disposition: A | Discharge status: Home / Self Care | Payer: Medicare Other | Attending: Emergency Medicine | Admitting: Emergency Medicine

## 2019-03-04 DIAGNOSIS — I10 Essential (primary) hypertension: Secondary | ICD-10-CM | POA: Diagnosis not present

## 2019-03-04 DIAGNOSIS — U071 COVID-19: Secondary | ICD-10-CM

## 2019-03-04 DIAGNOSIS — Z87891 Personal history of nicotine dependence: Secondary | ICD-10-CM | POA: Insufficient documentation

## 2019-03-04 DIAGNOSIS — Z7984 Long term (current) use of oral hypoglycemic drugs: Secondary | ICD-10-CM | POA: Insufficient documentation

## 2019-03-04 DIAGNOSIS — Z79899 Other long term (current) drug therapy: Secondary | ICD-10-CM | POA: Insufficient documentation

## 2019-03-04 DIAGNOSIS — Z7982 Long term (current) use of aspirin: Secondary | ICD-10-CM | POA: Insufficient documentation

## 2019-03-04 DIAGNOSIS — J449 Chronic obstructive pulmonary disease, unspecified: Secondary | ICD-10-CM | POA: Diagnosis not present

## 2019-03-04 DIAGNOSIS — R509 Fever, unspecified: Secondary | ICD-10-CM | POA: Diagnosis not present

## 2019-03-04 DIAGNOSIS — E119 Type 2 diabetes mellitus without complications: Secondary | ICD-10-CM | POA: Diagnosis not present

## 2019-03-04 DIAGNOSIS — R05 Cough: Secondary | ICD-10-CM | POA: Diagnosis not present

## 2019-03-04 LAB — COMPREHENSIVE METABOLIC PANEL
ALT: 15 U/L (ref 0–44)
AST: 27 U/L (ref 15–41)
Albumin: 3.5 g/dL (ref 3.5–5.0)
Alkaline Phosphatase: 47 U/L (ref 38–126)
Anion gap: 8 (ref 5–15)
BUN: 37 mg/dL — ABNORMAL HIGH (ref 8–23)
CO2: 25 mmol/L (ref 22–32)
Calcium: 8.2 mg/dL — ABNORMAL LOW (ref 8.9–10.3)
Chloride: 101 mmol/L (ref 98–111)
Creatinine, Ser: 3.19 mg/dL — ABNORMAL HIGH (ref 0.61–1.24)
GFR calc Af Amer: 19 mL/min — ABNORMAL LOW (ref >60)
GFR calc non Af Amer: 17 mL/min — ABNORMAL LOW (ref >60)
Glucose, Bld: 135 mg/dL — ABNORMAL HIGH (ref 70–99)
Potassium: 5.1 mmol/L (ref 3.5–5.1)
Sodium: 134 mmol/L — ABNORMAL LOW (ref 135–145)
Total Bilirubin: 0.6 mg/dL (ref 0.3–1.2)
Total Protein: 7.1 g/dL (ref 6.5–8.1)

## 2019-03-04 LAB — POC SARS CORONAVIRUS 2 AG -  ED: SARS Coronavirus 2 Ag: POSITIVE — AB

## 2019-03-04 LAB — URINALYSIS, ROUTINE W REFLEX MICROSCOPIC
Bacteria, UA: NONE SEEN
Bilirubin Urine: NEGATIVE
Glucose, UA: NEGATIVE mg/dL
Ketones, ur: NEGATIVE mg/dL
Leukocytes,Ua: NEGATIVE
Nitrite: NEGATIVE
Protein, ur: 100 mg/dL — AB
Specific Gravity, Urine: 1.013 (ref 1.005–1.030)
pH: 7 (ref 5.0–8.0)

## 2019-03-04 LAB — CBC
HCT: 30.5 % — ABNORMAL LOW (ref 39.0–52.0)
Hemoglobin: 9.8 g/dL — ABNORMAL LOW (ref 13.0–17.0)
MCH: 30.2 pg (ref 26.0–34.0)
MCHC: 32.1 g/dL (ref 30.0–36.0)
MCV: 94.1 fL (ref 80.0–100.0)
Platelets: 208 10*3/uL (ref 150–400)
RBC: 3.24 MIL/uL — ABNORMAL LOW (ref 4.22–5.81)
RDW: 13.9 % (ref 11.5–15.5)
WBC: 9.8 10*3/uL (ref 4.0–10.5)
nRBC: 0 % (ref 0.0–0.2)

## 2019-03-04 LAB — DIFFERENTIAL
Abs Immature Granulocytes: 0.06 10*3/uL (ref 0.00–0.07)
Basophils Absolute: 0 10*3/uL (ref 0.0–0.1)
Basophils Relative: 0 %
Eosinophils Absolute: 0 10*3/uL (ref 0.0–0.5)
Eosinophils Relative: 0 %
Immature Granulocytes: 1 %
Lymphocytes Relative: 5 %
Lymphs Abs: 0.5 10*3/uL — ABNORMAL LOW (ref 0.7–4.0)
Monocytes Absolute: 0.8 10*3/uL (ref 0.1–1.0)
Monocytes Relative: 8 %
Neutro Abs: 8.5 10*3/uL — ABNORMAL HIGH (ref 1.7–7.7)
Neutrophils Relative %: 86 %

## 2019-03-04 MED ORDER — ACETAMINOPHEN 325 MG PO TABS
650.0000 mg | ORAL_TABLET | Freq: Once | ORAL | Status: AC | PRN
  Administered 2019-03-04: 650 mg via ORAL
  Filled 2019-03-04: qty 2

## 2019-03-04 NOTE — ED Provider Notes (Signed)
[facility] EMERGENCY DEPARTMENT Provider Note   CSN: [id number] Arrival date & time: 03/04/19  1039     History Chief Complaint  Patient presents with  . Fever    Nicholas Swanson is a 85 y.o. male.  Patient presents with fever cough and aches.   Cough Cough characteristics:  Non-productive Sputum characteristics:  Nondescript Severity:  Moderate Onset quality:  Sudden Timing:  Constant Progression:  Worsening Chronicity:  New Smoker: no   Context: not animal exposure   Relieved by:  Nothing Worsened by:  Nothing Associated symptoms: fever   Associated symptoms: no chest pain, no eye discharge, no headaches and no rash        Past Medical History:  Diagnosis Date  . COPD (chronic obstructive pulmonary disease) ([place])   . Diabetes mellitus   . Hypertension     Patient Active Problem List   Diagnosis Date Noted  . HYPERLIPIDEMIA 03/31/2006  . OVERWEIGHT 03/31/2006  . DIABETES MELLITUS, TYPE II 12/17/2005  . CARPAL TUNNEL SYNDROME 12/17/2005  . HYPERTENSION 12/17/2005  . Chronic airway obstruction, not elsewhere classified 12/17/2005  . RENAL INSUFFICIENCY 12/17/2005  . PROSTATE CANCER, HX OF 12/17/2005    History reviewed. No pertinent surgical history.     History reviewed. No pertinent family history.  Social History   Tobacco Use  . Smoking status: Former Smoker    Types: Cigarettes  . Smokeless tobacco: Never Used  Substance Use Topics  . Alcohol use: No  . Drug use: No    Home Medications Prior to Admission medications   Medication Sig Start Date End Date Taking? Authorizing Provider  amLODipine (NORVASC) 5 MG tablet Take 5 mg by mouth daily.    [provider]  aspirin EC 81 MG tablet Take 81 mg by mouth daily.    [provider]  atorvastatin (LIPITOR) 40 MG tablet Take 40 mg by mouth daily.    [provider]  azithromycin (ZITHROMAX) 250 MG tablet Take 2 tablets initially then one each day 04/12/11    Milton Ferguson, MD  glipiZIDE (GLUCOTROL XL) 2.5 MG 24 hr tablet Take 2.5 mg by mouth daily.    [provider]  metFORMIN (GLUCOPHAGE) 1000 MG tablet Take 1,000 mg by mouth 2 (two) times daily with a meal.    [provider]  metoprolol (LOPRESSOR) 50 MG tablet Take 50 mg by mouth daily.    [provider]  predniSONE (DELTASONE) 10 MG tablet Take 2 tablets (20 mg total) by mouth daily. 04/12/11   Milton Ferguson, MD  terazosin (HYTRIN) 5 MG capsule Take 5 mg by mouth at bedtime.    [provider]  valsartan-hydrochlorothiazide (DIOVAN-HCT) 160-25 MG per tablet Take 1 tablet by mouth daily.    [provider]    Allergies    Ace inhibitors and Simvastatin  Review of Systems   Review of Systems  Constitutional: Positive for fever. Negative for appetite change and fatigue.  HENT: Negative for congestion, ear discharge and sinus pressure.   Eyes: Negative for discharge.  Respiratory: Positive for cough.   Cardiovascular: Negative for chest pain.  Gastrointestinal: Negative for abdominal pain and diarrhea.  Genitourinary: Negative for frequency and hematuria.  Musculoskeletal: Negative for back pain.  Skin: Negative for rash.  Neurological: Negative for seizures and headaches.  Psychiatric/Behavioral: Negative for hallucinations.    Physical Exam Updated Vital Signs BP (!) 137/59   Pulse 84   Temp (S) (!) 102.1 F (38.9 C) (Oral)  Resp 19   Ht 6\' 1"  (1.854 m)   Wt 83 kg   SpO2 93%   BMI 24.14 kg/m   Physical Exam Vitals and nursing note reviewed.  Constitutional:      Appearance: He is well-developed.  HENT:     Head: Normocephalic.     Nose: Nose normal.  Eyes:     General: No scleral icterus.    Conjunctiva/sclera: Conjunctivae normal.  Neck:     Thyroid: No thyromegaly.  Cardiovascular:     Rate and Rhythm: Normal rate and regular rhythm.     Heart sounds: No murmur. No friction rub. No gallop.   Pulmonary:      Breath sounds: No stridor. No wheezing or rales.  Chest:     Chest wall: No tenderness.  Abdominal:     General: There is no distension.     Tenderness: There is no abdominal tenderness. There is no rebound.  Musculoskeletal:        General: Normal range of motion.     Cervical back: Neck supple.  Lymphadenopathy:     Cervical: No cervical adenopathy.  Skin:    Findings: No erythema or rash.  Neurological:     Mental Status: He is alert and oriented to person, place, and time.     Motor: No abnormal muscle tone.     Coordination: Coordination normal.  Psychiatric:        Behavior: Behavior normal.     ED Results / Procedures / Treatments   Labs (all labs ordered are listed, but only abnormal results are displayed) Labs Reviewed  CBC - Abnormal; Notable for the following components:      Result Value   RBC 3.24 (*)    Hemoglobin 9.8 (*)    HCT 30.5 (*)    All other components within normal limits  COMPREHENSIVE METABOLIC PANEL - Abnormal; Notable for the following components:   Sodium 134 (*)    Glucose, Bld 135 (*)    BUN 37 (*)    Creatinine, Ser 3.19 (*)    Calcium 8.2 (*)    GFR calc non Af Amer 17 (*)    GFR calc Af Amer 19 (*)    All other components within normal limits  URINALYSIS, ROUTINE W REFLEX MICROSCOPIC - Abnormal; Notable for the following components:   Hgb urine dipstick SMALL (*)    Protein, ur 100 (*)    All other components within normal limits  DIFFERENTIAL - Abnormal; Notable for the following components:   Neutro Abs 8.5 (*)    Lymphs Abs 0.5 (*)    All other components within normal limits  POC SARS CORONAVIRUS 2 AG -  ED - Abnormal; Notable for the following components:   SARS Coronavirus 2 Ag POSITIVE (*)    All other components within normal limits    EKG None  Radiology DG Chest Portable 1 View  Result Date: 03/04/2019 CLINICAL DATA:  Cough fever chills. EXAM: PORTABLE CHEST 1 VIEW COMPARISON:  04/12/2011 FINDINGS:  Cardiomediastinal contours are stable. Linear opacities are seen at the lung bases. Lungs are otherwise clear. No signs of pleural effusion or dense consolidation. No acute bone finding. IMPRESSION: Basilar atelectasis without acute cardiopulmonary disease. Electronically Signed   By: Zetta Bills M.D.   On: 03/04/2019 12:42    Procedures Procedures (including critical care time)  Medications Ordered in ED Medications  acetaminophen (TYLENOL) tablet 650 mg (650 mg Oral Given 03/04/19 1129)    ED Course  I have reviewed the triage vital signs and the nursing notes.  Pertinent labs & imaging results that were available during my care of the patient were reviewed by me and considered in my medical decision making (see chart for details).    MDM Rules/Calculators/A&P                      Patient is positive for Covid.  Patient is nontoxic and not hypoxic.  He will be discharged home and told to use Tylenol fluids and use his inhaler and follow-up with his PCP if any problem Final Clinical Impression(s) / ED Diagnoses Final diagnoses:  COVID-19    Rx / DC Orders ED Discharge Orders    None       Milton Ferguson, MD 03/04/19 1303

## 2019-03-04 NOTE — Discharge Instructions (Addendum)
Take Tylenol for fever drink plenty of fluids use your inhaler for any shortness of breath and follow-up with your doctor in the next week or 2.  Return if getting worse

## 2019-03-04 NOTE — ED Triage Notes (Signed)
Patient c/o fever, chills, fatigue, and urinary incontinency that started yesterday. Per patient highest temp at home 102. Denies taking any medication for fever.Denies nausea, vomiting, or diarrhea. Per patient "always has a cough."

## 2019-03-07 ENCOUNTER — Telehealth: Payer: Self-pay | Admitting: Nurse Practitioner

## 2019-03-07 NOTE — Telephone Encounter (Signed)
Called to discuss with patient about Covid symptoms and the use of bamlanivimab, a monoclonal antibody infusion for those with mild to moderate Covid symptoms and at a high risk of hospitalization.  Pt is qualified for this infusion at the [facility] infusion center due to Age > 65   Message left to call back, person who answered took message.

## 2019-03-11 ENCOUNTER — Inpatient Hospital Stay (HOSPITAL_COMMUNITY)
Admission: EM | Admit: 2019-03-11 | Discharge: 2019-03-20 | DRG: 177 | Disposition: A | Discharge status: Home Health | Payer: Medicare Other | Attending: Family Medicine | Admitting: Family Medicine

## 2019-03-11 ENCOUNTER — Other Ambulatory Visit: Payer: Self-pay

## 2019-03-11 ENCOUNTER — Emergency Department (HOSPITAL_COMMUNITY): Payer: Medicare Other

## 2019-03-11 ENCOUNTER — Encounter (HOSPITAL_COMMUNITY): Payer: Self-pay

## 2019-03-11 DIAGNOSIS — N1831 Chronic kidney disease, stage 3a: Secondary | ICD-10-CM | POA: Diagnosis not present

## 2019-03-11 DIAGNOSIS — E875 Hyperkalemia: Secondary | ICD-10-CM | POA: Diagnosis not present

## 2019-03-11 DIAGNOSIS — Z888 Allergy status to other drugs, medicaments and biological substances status: Secondary | ICD-10-CM

## 2019-03-11 DIAGNOSIS — R0902 Hypoxemia: Secondary | ICD-10-CM

## 2019-03-11 DIAGNOSIS — R7989 Other specified abnormal findings of blood chemistry: Secondary | ICD-10-CM | POA: Diagnosis not present

## 2019-03-11 DIAGNOSIS — J439 Emphysema, unspecified: Secondary | ICD-10-CM | POA: Diagnosis not present

## 2019-03-11 DIAGNOSIS — J159 Unspecified bacterial pneumonia: Secondary | ICD-10-CM | POA: Diagnosis present

## 2019-03-11 DIAGNOSIS — J449 Chronic obstructive pulmonary disease, unspecified: Secondary | ICD-10-CM | POA: Diagnosis not present

## 2019-03-11 DIAGNOSIS — U071 COVID-19: Secondary | ICD-10-CM | POA: Diagnosis not present

## 2019-03-11 DIAGNOSIS — I10 Essential (primary) hypertension: Secondary | ICD-10-CM | POA: Diagnosis not present

## 2019-03-11 DIAGNOSIS — E785 Hyperlipidemia, unspecified: Secondary | ICD-10-CM | POA: Diagnosis not present

## 2019-03-11 DIAGNOSIS — J1282 Pneumonia due to coronavirus disease 2019: Secondary | ICD-10-CM | POA: Diagnosis not present

## 2019-03-11 DIAGNOSIS — R791 Abnormal coagulation profile: Secondary | ICD-10-CM | POA: Diagnosis not present

## 2019-03-11 DIAGNOSIS — N289 Disorder of kidney and ureter, unspecified: Secondary | ICD-10-CM | POA: Diagnosis not present

## 2019-03-11 DIAGNOSIS — E1122 Type 2 diabetes mellitus with diabetic chronic kidney disease: Secondary | ICD-10-CM | POA: Diagnosis not present

## 2019-03-11 DIAGNOSIS — J9601 Acute respiratory failure with hypoxia: Secondary | ICD-10-CM | POA: Diagnosis present

## 2019-03-11 DIAGNOSIS — Z8249 Family history of ischemic heart disease and other diseases of the circulatory system: Secondary | ICD-10-CM | POA: Diagnosis not present

## 2019-03-11 DIAGNOSIS — J1289 Other viral pneumonia: Secondary | ICD-10-CM | POA: Diagnosis not present

## 2019-03-11 DIAGNOSIS — R0602 Shortness of breath: Secondary | ICD-10-CM | POA: Diagnosis not present

## 2019-03-11 DIAGNOSIS — E1165 Type 2 diabetes mellitus with hyperglycemia: Secondary | ICD-10-CM | POA: Diagnosis present

## 2019-03-11 DIAGNOSIS — R918 Other nonspecific abnormal finding of lung field: Secondary | ICD-10-CM | POA: Diagnosis not present

## 2019-03-11 DIAGNOSIS — Z743 Need for continuous supervision: Secondary | ICD-10-CM | POA: Diagnosis not present

## 2019-03-11 DIAGNOSIS — N185 Chronic kidney disease, stage 5: Secondary | ICD-10-CM | POA: Diagnosis present

## 2019-03-11 DIAGNOSIS — N183 Chronic kidney disease, stage 3 unspecified: Secondary | ICD-10-CM | POA: Diagnosis present

## 2019-03-11 DIAGNOSIS — I129 Hypertensive chronic kidney disease with stage 1 through stage 4 chronic kidney disease, or unspecified chronic kidney disease: Secondary | ICD-10-CM | POA: Diagnosis not present

## 2019-03-11 DIAGNOSIS — N179 Acute kidney failure, unspecified: Secondary | ICD-10-CM | POA: Diagnosis not present

## 2019-03-11 DIAGNOSIS — J069 Acute upper respiratory infection, unspecified: Secondary | ICD-10-CM | POA: Diagnosis not present

## 2019-03-11 DIAGNOSIS — E1121 Type 2 diabetes mellitus with diabetic nephropathy: Secondary | ICD-10-CM | POA: Diagnosis not present

## 2019-03-11 LAB — PROCALCITONIN: Procalcitonin: >150 ng/mL

## 2019-03-11 LAB — CBC WITH DIFFERENTIAL/PLATELET
Abs Immature Granulocytes: 0.1 10*3/uL — ABNORMAL HIGH (ref 0.00–0.07)
Basophils Absolute: 0 10*3/uL (ref 0.0–0.1)
Basophils Relative: 0 %
Eosinophils Absolute: 0 10*3/uL (ref 0.0–0.5)
Eosinophils Relative: 0 %
HCT: 33.6 % — ABNORMAL LOW (ref 39.0–52.0)
Hemoglobin: 10.9 g/dL — ABNORMAL LOW (ref 13.0–17.0)
Immature Granulocytes: 1 %
Lymphocytes Relative: 13 %
Lymphs Abs: 1 10*3/uL (ref 0.7–4.0)
MCH: 29.6 pg (ref 26.0–34.0)
MCHC: 32.4 g/dL (ref 30.0–36.0)
MCV: 91.3 fL (ref 80.0–100.0)
Monocytes Absolute: 0.3 10*3/uL (ref 0.1–1.0)
Monocytes Relative: 5 %
Neutro Abs: 5.9 10*3/uL (ref 1.7–7.7)
Neutrophils Relative %: 81 %
Platelets: 347 10*3/uL (ref 150–400)
RBC: 3.68 MIL/uL — ABNORMAL LOW (ref 4.22–5.81)
RDW: 14.6 % (ref 11.5–15.5)
WBC: 7.3 10*3/uL (ref 4.0–10.5)
nRBC: 0 % (ref 0.0–0.2)

## 2019-03-11 LAB — TRIGLYCERIDES: Triglycerides: 126 mg/dL (ref <150)

## 2019-03-11 LAB — COMPREHENSIVE METABOLIC PANEL
ALT: 220 U/L — ABNORMAL HIGH (ref 0–44)
AST: 261 U/L — ABNORMAL HIGH (ref 15–41)
Albumin: 2.9 g/dL — ABNORMAL LOW (ref 3.5–5.0)
Alkaline Phosphatase: 86 U/L (ref 38–126)
Anion gap: 14 (ref 5–15)
BUN: 62 mg/dL — ABNORMAL HIGH (ref 8–23)
CO2: 17 mmol/L — ABNORMAL LOW (ref 22–32)
Calcium: 8 mg/dL — ABNORMAL LOW (ref 8.9–10.3)
Chloride: 105 mmol/L (ref 98–111)
Creatinine, Ser: 3.38 mg/dL — ABNORMAL HIGH (ref 0.61–1.24)
GFR calc Af Amer: 18 mL/min — ABNORMAL LOW (ref >60)
GFR calc non Af Amer: 16 mL/min — ABNORMAL LOW (ref >60)
Glucose, Bld: 138 mg/dL — ABNORMAL HIGH (ref 70–99)
Potassium: 5.4 mmol/L — ABNORMAL HIGH (ref 3.5–5.1)
Sodium: 136 mmol/L (ref 135–145)
Total Bilirubin: 1 mg/dL (ref 0.3–1.2)
Total Protein: 7.5 g/dL (ref 6.5–8.1)

## 2019-03-11 LAB — C-REACTIVE PROTEIN: CRP: 39 mg/dL — ABNORMAL HIGH (ref <1.0)

## 2019-03-11 LAB — LACTIC ACID, PLASMA
Lactic Acid, Venous: 1.6 mmol/L (ref 0.5–1.9)
Lactic Acid, Venous: 2.7 mmol/L (ref 0.5–1.9)

## 2019-03-11 LAB — CBG MONITORING, ED
Glucose-Capillary: 113 mg/dL — ABNORMAL HIGH (ref 70–99)
Glucose-Capillary: 129 mg/dL — ABNORMAL HIGH (ref 70–99)

## 2019-03-11 LAB — D-DIMER, QUANTITATIVE: D-Dimer, Quant: 1.68 ug/mL-FEU — ABNORMAL HIGH (ref 0.00–0.50)

## 2019-03-11 LAB — FERRITIN: Ferritin: 2385 ng/mL — ABNORMAL HIGH (ref 24–336)

## 2019-03-11 LAB — FIBRINOGEN: Fibrinogen: >800 mg/dL — ABNORMAL HIGH (ref 210–475)

## 2019-03-11 LAB — ABO/RH: ABO/RH(D): O POS

## 2019-03-11 LAB — LACTATE DEHYDROGENASE: LDH: 459 U/L — ABNORMAL HIGH (ref 98–192)

## 2019-03-11 MED ORDER — ASPIRIN EC 81 MG PO TBEC
81.0000 mg | DELAYED_RELEASE_TABLET | Freq: Every day | ORAL | Status: DC
  Administered 2019-03-12 – 2019-03-20 (×9): 81 mg via ORAL
  Filled 2019-03-11 (×9): qty 1

## 2019-03-11 MED ORDER — ACETAMINOPHEN 325 MG PO TABS
650.0000 mg | ORAL_TABLET | Freq: Four times a day (QID) | ORAL | Status: DC | PRN
  Administered 2019-03-18: 04:00:00 650 mg via ORAL
  Filled 2019-03-11: qty 2

## 2019-03-11 MED ORDER — METHYLPREDNISOLONE SODIUM SUCC 125 MG IJ SOLR: 125.0000 mg | Freq: Once | INTRAMUSCULAR | Status: DC

## 2019-03-11 MED ORDER — ACETAMINOPHEN 650 MG RE SUPP: 650.0000 mg | Freq: Four times a day (QID) | RECTAL | Status: DC | PRN

## 2019-03-11 MED ORDER — SODIUM CHLORIDE 0.9% FLUSH
3.0000 mL | INTRAVENOUS | Status: DC | PRN
  Administered 2019-03-16: 3 mL via INTRAVENOUS

## 2019-03-11 MED ORDER — METOPROLOL TARTRATE 50 MG PO TABS
50.0000 mg | ORAL_TABLET | Freq: Every day | ORAL | Status: DC
  Administered 2019-03-11 – 2019-03-12 (×2): 50 mg via ORAL
  Filled 2019-03-11 (×2): qty 1

## 2019-03-11 MED ORDER — METHYLPREDNISOLONE SODIUM SUCC 125 MG IJ SOLR
0.5000 mg/kg | Freq: Two times a day (BID) | INTRAMUSCULAR | Status: DC
  Administered 2019-03-11 – 2019-03-15 (×9): 41.25 mg via INTRAVENOUS
  Filled 2019-03-11 (×8): qty 2

## 2019-03-11 MED ORDER — INSULIN ASPART 100 UNIT/ML ~~LOC~~ SOLN
0.0000 [IU] | Freq: Every day | SUBCUTANEOUS | Status: DC
  Administered 2019-03-15: 2 [IU] via SUBCUTANEOUS

## 2019-03-11 MED ORDER — ALBUTEROL SULFATE HFA 108 (90 BASE) MCG/ACT IN AERS
4.0000 | INHALATION_SPRAY | Freq: Once | RESPIRATORY_TRACT | Status: AC
  Administered 2019-03-11: 4 via RESPIRATORY_TRACT
  Filled 2019-03-11: qty 6.7

## 2019-03-11 MED ORDER — SODIUM CHLORIDE 0.9% FLUSH
3.0000 mL | Freq: Two times a day (BID) | INTRAVENOUS | Status: DC
  Administered 2019-03-13 – 2019-03-20 (×15): 3 mL via INTRAVENOUS

## 2019-03-11 MED ORDER — INSULIN ASPART 100 UNIT/ML ~~LOC~~ SOLN
3.0000 [IU] | Freq: Three times a day (TID) | SUBCUTANEOUS | Status: DC
  Administered 2019-03-12 – 2019-03-17 (×15): 3 [IU] via SUBCUTANEOUS
  Filled 2019-03-11: qty 1

## 2019-03-11 MED ORDER — ONDANSETRON HCL 4 MG PO TABS: 4.0000 mg | ORAL_TABLET | Freq: Four times a day (QID) | ORAL | Status: DC | PRN

## 2019-03-11 MED ORDER — HYDROCOD POLST-CPM POLST ER 10-8 MG/5ML PO SUER: 5.0000 mL | Freq: Two times a day (BID) | ORAL | Status: DC | PRN

## 2019-03-11 MED ORDER — SODIUM CHLORIDE 0.9 % IV SOLN
1.0000 g | INTRAVENOUS | Status: AC
  Administered 2019-03-11 – 2019-03-15 (×5): 1 g via INTRAVENOUS
  Filled 2019-03-11: qty 1
  Filled 2019-03-11 (×2): qty 10
  Filled 2019-03-11 (×2): qty 1

## 2019-03-11 MED ORDER — GLIPIZIDE ER 2.5 MG PO TB24
2.5000 mg | ORAL_TABLET | Freq: Every day | ORAL | Status: DC
  Administered 2019-03-12 – 2019-03-15 (×4): 2.5 mg via ORAL
  Filled 2019-03-11 (×6): qty 1

## 2019-03-11 MED ORDER — ADULT MULTIVITAMIN W/MINERALS CH
1.0000 | ORAL_TABLET | Freq: Every day | ORAL | Status: DC
  Administered 2019-03-11 – 2019-03-20 (×10): 1 via ORAL
  Filled 2019-03-11 (×10): qty 1

## 2019-03-11 MED ORDER — THIAMINE HCL 100 MG PO TABS
100.0000 mg | ORAL_TABLET | Freq: Every day | ORAL | Status: DC
  Administered 2019-03-11 – 2019-03-20 (×10): 100 mg via ORAL
  Filled 2019-03-11 (×10): qty 1

## 2019-03-11 MED ORDER — SODIUM CHLORIDE 0.9 % IV BOLUS
1000.0000 mL | Freq: Once | INTRAVENOUS | Status: AC
  Administered 2019-03-11: 1000 mL via INTRAVENOUS

## 2019-03-11 MED ORDER — SODIUM CHLORIDE 0.9 % IV SOLN
250.0000 mL | INTRAVENOUS | Status: DC | PRN
  Administered 2019-03-12: 250 mL via INTRAVENOUS

## 2019-03-11 MED ORDER — ENOXAPARIN SODIUM 30 MG/0.3ML ~~LOC~~ SOLN
30.0000 mg | SUBCUTANEOUS | Status: DC
  Administered 2019-03-11 – 2019-03-19 (×9): 30 mg via SUBCUTANEOUS
  Filled 2019-03-11 (×10): qty 0.3

## 2019-03-11 MED ORDER — INSULIN ASPART 100 UNIT/ML ~~LOC~~ SOLN
0.0000 [IU] | Freq: Three times a day (TID) | SUBCUTANEOUS | Status: DC
  Administered 2019-03-11: 3 [IU] via SUBCUTANEOUS
  Administered 2019-03-12 (×2): 1 [IU] via SUBCUTANEOUS
  Administered 2019-03-13: 2 [IU] via SUBCUTANEOUS
  Administered 2019-03-13 (×2): 1 [IU] via SUBCUTANEOUS
  Administered 2019-03-14: 3 [IU] via SUBCUTANEOUS
  Administered 2019-03-14 – 2019-03-15 (×4): 2 [IU] via SUBCUTANEOUS
  Administered 2019-03-15: 3 [IU] via SUBCUTANEOUS
  Administered 2019-03-16: 2 [IU] via SUBCUTANEOUS
  Administered 2019-03-16: 1 [IU] via SUBCUTANEOUS
  Administered 2019-03-16: 3 [IU] via SUBCUTANEOUS
  Administered 2019-03-17: 5 [IU] via SUBCUTANEOUS
  Administered 2019-03-17 – 2019-03-18 (×3): 2 [IU] via SUBCUTANEOUS
  Administered 2019-03-18: 3 [IU] via SUBCUTANEOUS
  Administered 2019-03-18: 10:00:00 2 [IU] via SUBCUTANEOUS
  Administered 2019-03-19: 3 [IU] via SUBCUTANEOUS
  Administered 2019-03-19: 2 [IU] via SUBCUTANEOUS
  Administered 2019-03-19: 5 [IU] via SUBCUTANEOUS
  Administered 2019-03-20: 2 [IU] via SUBCUTANEOUS
  Administered 2019-03-20: 3 [IU] via SUBCUTANEOUS
  Filled 2019-03-11 (×2): qty 1

## 2019-03-11 MED ORDER — ASCORBIC ACID 500 MG PO TABS
500.0000 mg | ORAL_TABLET | Freq: Every day | ORAL | Status: DC
  Administered 2019-03-11 – 2019-03-20 (×10): 500 mg via ORAL
  Filled 2019-03-11 (×10): qty 1

## 2019-03-11 MED ORDER — GUAIFENESIN ER 600 MG PO TB12
600.0000 mg | ORAL_TABLET | Freq: Two times a day (BID) | ORAL | Status: DC
  Administered 2019-03-11 – 2019-03-20 (×18): 600 mg via ORAL
  Filled 2019-03-11 (×19): qty 1

## 2019-03-11 MED ORDER — ONDANSETRON HCL 4 MG/2ML IJ SOLN: 4.0000 mg | Freq: Four times a day (QID) | INTRAMUSCULAR | Status: DC | PRN

## 2019-03-11 MED ORDER — ATORVASTATIN CALCIUM 40 MG PO TABS
40.0000 mg | ORAL_TABLET | Freq: Every day | ORAL | Status: DC
  Administered 2019-03-11 – 2019-03-20 (×10): 40 mg via ORAL
  Filled 2019-03-11 (×10): qty 1

## 2019-03-11 MED ORDER — FOLIC ACID 1 MG PO TABS
1.0000 mg | ORAL_TABLET | Freq: Every day | ORAL | Status: DC
  Administered 2019-03-11 – 2019-03-20 (×10): 1 mg via ORAL
  Filled 2019-03-11 (×10): qty 1

## 2019-03-11 MED ORDER — ALBUTEROL SULFATE HFA 108 (90 BASE) MCG/ACT IN AERS
2.0000 | INHALATION_SPRAY | Freq: Four times a day (QID) | RESPIRATORY_TRACT | Status: DC
  Administered 2019-03-11 – 2019-03-20 (×35): 2 via RESPIRATORY_TRACT
  Filled 2019-03-11 (×2): qty 6.7

## 2019-03-11 MED ORDER — ZINC SULFATE 220 (50 ZN) MG PO CAPS
220.0000 mg | ORAL_CAPSULE | Freq: Every day | ORAL | Status: DC
  Administered 2019-03-11 – 2019-03-20 (×10): 220 mg via ORAL
  Filled 2019-03-11 (×11): qty 1

## 2019-03-11 MED ORDER — AMLODIPINE BESYLATE 5 MG PO TABS
5.0000 mg | ORAL_TABLET | Freq: Every day | ORAL | Status: DC
  Administered 2019-03-11 – 2019-03-20 (×10): 5 mg via ORAL
  Filled 2019-03-11 (×10): qty 1

## 2019-03-11 MED ORDER — SODIUM CHLORIDE 0.9 % IV SOLN
100.0000 mg | Freq: Every day | INTRAVENOUS | Status: AC
  Administered 2019-03-12 – 2019-03-15 (×4): 100 mg via INTRAVENOUS
  Filled 2019-03-11 (×5): qty 20

## 2019-03-11 MED ORDER — TERAZOSIN HCL 5 MG PO CAPS
5.0000 mg | ORAL_CAPSULE | Freq: Every day | ORAL | Status: DC
  Administered 2019-03-12 – 2019-03-19 (×8): 5 mg via ORAL
  Filled 2019-03-11 (×12): qty 1

## 2019-03-11 MED ORDER — POLYETHYLENE GLYCOL 3350 17 G PO PACK: 17.0000 g | PACK | Freq: Every day | ORAL | Status: DC | PRN

## 2019-03-11 MED ORDER — SODIUM CHLORIDE 0.9 % IV BOLUS
500.0000 mL | Freq: Once | INTRAVENOUS | Status: AC
  Administered 2019-03-11: 500 mL via INTRAVENOUS

## 2019-03-11 MED ORDER — SODIUM CHLORIDE 0.9 % IV SOLN: Freq: Once | INTRAVENOUS | Status: AC

## 2019-03-11 MED ORDER — SODIUM CHLORIDE 0.9 % IV SOLN
500.0000 mg | INTRAVENOUS | Status: AC
  Administered 2019-03-11 – 2019-03-15 (×5): 500 mg via INTRAVENOUS
  Filled 2019-03-11 (×5): qty 500

## 2019-03-11 MED ORDER — GUAIFENESIN-DM 100-10 MG/5ML PO SYRP: 10.0000 mL | ORAL_SOLUTION | ORAL | Status: DC | PRN

## 2019-03-11 MED ORDER — DEXAMETHASONE SODIUM PHOSPHATE 10 MG/ML IJ SOLN
8.0000 mg | Freq: Once | INTRAMUSCULAR | Status: AC
  Administered 2019-03-11: 8 mg via INTRAVENOUS
  Filled 2019-03-11: qty 1

## 2019-03-11 MED ORDER — TRAZODONE HCL 50 MG PO TABS: 50.0000 mg | ORAL_TABLET | Freq: Every evening | ORAL | Status: DC | PRN

## 2019-03-11 MED ORDER — SODIUM ZIRCONIUM CYCLOSILICATE 5 G PO PACK
5.0000 g | PACK | Freq: Once | ORAL | Status: AC
  Administered 2019-03-11: 5 g via ORAL
  Filled 2019-03-11: qty 1

## 2019-03-11 MED ORDER — SODIUM CHLORIDE 0.9 % IV SOLN
200.0000 mg | Freq: Once | INTRAVENOUS | Status: AC
  Administered 2019-03-11: 200 mg via INTRAVENOUS
  Filled 2019-03-11: qty 40

## 2019-03-11 NOTE — ED Triage Notes (Signed)
Pt reports to ED brought by EMS for complaints of increased SOB x 2days. Pt with hx COPD. Pt diagnosed with Covid approx 1 week ago.

## 2019-03-11 NOTE — ED Notes (Signed)
Pt given urinal and encouraged to void.

## 2019-03-11 NOTE — ED Provider Notes (Signed)
This patient is a 85 year old male, he is brought in by EMS because of increasing shortness of breath and coughing.  He has known COPD but has also been diagnosed with Covid approximately 1 week ago.  He has not been having fevers but is increasingly weak, has no appetite.  Symptoms are persistent, nothing seems to make this better, worse with any exertion at all.  On exam he has some scattered rales and rhonchi, frequent coughing, oxygen is low, x-ray shows patchy infiltrates consistent with Covid.   EKG Interpretation  Date/Time:  Saturday March 11 2019 14:04:09 EST Ventricular Rate:  89 PR Interval:    QRS Duration: 75 QT Interval:  338 QTC Calculation: 412 R Axis:   54 Text Interpretation: Sinus rhythm Normal ECG since last tracing no significant change Confirmed by Noemi Chapel [phone]) on 02/28/2019 2:39:31 PM       Medical screening examination/treatment/procedure(s) were conducted as a shared visit with non-physician practitioner(s) and myself.  I personally evaluated the patient during the encounter.  Clinical Impression:   Final diagnoses:  Elevated LFTs  COVID-19  Pneumonia due to COVID-19 virus  Renal insufficiency  Hyperkalemia         Noemi Chapel, MD 03/12/19 [phone]

## 2019-03-11 NOTE — ED Provider Notes (Addendum)
[facility] EMERGENCY DEPARTMENT Provider Note   CSN: [id number] Arrival date & time: 02/28/2019  1357     History Chief Complaint  Patient presents with  . Shortness of Breath    Nicholas Swanson is a 85 y.o. male with history of COPD, diabetes mellitus, hypertension, renal insufficiency presents for evaluation of acute onset, progressively worsening generalized weakness, fevers, myalgias, cough for 8 days.  He was seen and evaluated in the ED on 03/04/2019 for the symptoms was found to be Covid positive but was not hypoxic and was stable for discharge home at the time.  He tells me since then he has felt progressively more weak has had decreased appetite and decreased oral intake.  Developed nausea and vomiting a few days ago.  Emesis is nonbloody and nonbilious.  He denies abdominal pain, diarrhea, constipation, or urinary symptoms.  He has been taking some over-the-counter medications but does not remember what they are.  The history is provided by the patient.       Past Medical History:  Diagnosis Date  . COPD (chronic obstructive pulmonary disease) ([place])   . Diabetes mellitus   . Hypertension     Patient Active Problem List   Diagnosis Date Noted  . Acute respiratory disease due to COVID-19 virus 02/15/2019  . Type 2 diabetes mellitus with stage 3 chronic kidney disease ([place]) 02/21/2019  . Pneumonia due to COVID-19 virus 03/15/2019  . HYPERLIPIDEMIA 03/31/2006  . OVERWEIGHT 03/31/2006  . DIABETES MELLITUS, TYPE II 12/17/2005  . CARPAL TUNNEL SYNDROME 12/17/2005  . Essential hypertension 12/17/2005  . Chronic airway obstruction, not elsewhere classified 12/17/2005  . RENAL INSUFFICIENCY 12/17/2005  . PROSTATE CANCER, HX OF 12/17/2005    History reviewed. No pertinent surgical history.     No family history on file.  Social History   Tobacco Use  . Smoking status: Former Smoker    Types: Cigarettes  . Smokeless tobacco: Never Used  Substance Use Topics  .  Alcohol use: No  . Drug use: No    Home Medications Prior to Admission medications   Medication Sig Start Date End Date Taking? Authorizing Provider  amLODipine (NORVASC) 5 MG tablet Take 5 mg by mouth daily.    [provider]  aspirin EC 81 MG tablet Take 81 mg by mouth daily.    [provider]  atorvastatin (LIPITOR) 40 MG tablet Take 40 mg by mouth daily.    [provider]  azithromycin (ZITHROMAX) 250 MG tablet Take 2 tablets initially then one each day 04/12/11   Milton Ferguson, MD  glipiZIDE (GLUCOTROL XL) 2.5 MG 24 hr tablet Take 2.5 mg by mouth daily.    [provider]  metFORMIN (GLUCOPHAGE) 1000 MG tablet Take 1,000 mg by mouth 2 (two) times daily with a meal.    [provider]  metoprolol (LOPRESSOR) 50 MG tablet Take 50 mg by mouth daily.    [provider]  predniSONE (DELTASONE) 10 MG tablet Take 2 tablets (20 mg total) by mouth daily. 04/12/11   Milton Ferguson, MD  terazosin (HYTRIN) 5 MG capsule Take 5 mg by mouth at bedtime.    [provider]  valsartan-hydrochlorothiazide (DIOVAN-HCT) 160-25 MG per tablet Take 1 tablet by mouth daily.    [provider]    Allergies    Ace inhibitors and Simvastatin  Review of Systems   Review of Systems  Constitutional: Positive for chills, fatigue and fever.  Respiratory: Positive for cough and shortness of  breath.   Cardiovascular: Negative for chest pain.  Gastrointestinal: Positive for nausea and vomiting. Negative for abdominal pain and diarrhea.  Genitourinary: Negative for dysuria, frequency, hematuria and urgency.  All other systems reviewed and are negative.   Physical Exam Updated Vital Signs BP (!) 142/72   Pulse 81   Temp 99.6 F (37.6 C) (Oral)   Resp (!) 27   Ht 6\' 1"  (1.854 m)   Wt 83 kg   SpO2 97%   BMI 24.14 kg/m   Physical Exam Vitals and nursing note reviewed.  Constitutional:      General: He is not in acute distress.     Appearance: He is well-developed.     Comments: Thin, chronically ill in appearance  HENT:     Head: Normocephalic and atraumatic.  Eyes:     General:        Right eye: No discharge.        Left eye: No discharge.     Conjunctiva/sclera: Conjunctivae normal.  Neck:     Vascular: No JVD.     Trachea: No tracheal deviation.  Cardiovascular:     Rate and Rhythm: Normal rate and regular rhythm.     Pulses: Normal pulses.  Pulmonary:     Effort: Pulmonary effort is normal.     Comments: SPO2 saturations 92 to 96% on room air.  Tachypneic.  Speaking in shorter phrases.  Globally diminished breath sounds, scattered expiratory wheezes. Abdominal:     General: Bowel sounds are normal. There is no distension.     Palpations: Abdomen is soft.     Tenderness: There is no abdominal tenderness. There is no guarding or rebound.  Musculoskeletal:     Right lower leg: No tenderness. No edema.     Left lower leg: No tenderness. No edema.  Skin:    General: Skin is warm and dry.     Findings: No erythema.  Neurological:     Mental Status: He is alert.  Psychiatric:        Behavior: Behavior normal.     ED Results / Procedures / Treatments   Labs (all labs ordered are listed, but only abnormal results are displayed) Labs Reviewed  CBC WITH DIFFERENTIAL/PLATELET - Abnormal; Notable for the following components:      Result Value   RBC 3.68 (*)    Hemoglobin 10.9 (*)    HCT 33.6 (*)    Abs Immature Granulocytes 0.10 (*)    All other components within normal limits  COMPREHENSIVE METABOLIC PANEL - Abnormal; Notable for the following components:   Potassium 5.4 (*)    CO2 17 (*)    Glucose, Bld 138 (*)    BUN 62 (*)    Creatinine, Ser 3.38 (*)    Calcium 8.0 (*)    Albumin 2.9 (*)    AST 261 (*)    ALT 220 (*)    GFR calc non Af Amer 16 (*)    GFR calc Af Amer 18 (*)    All other components within normal limits  LACTATE DEHYDROGENASE - Abnormal; Notable for the following  components:   LDH 459 (*)    All other components within normal limits  D-DIMER, QUANTITATIVE (NOT AT [facility]) - Abnormal; Notable for the following components:   D-Dimer, Quant 1.68 (*)    All other components within normal limits  FIBRINOGEN - Abnormal; Notable for the following components:   Fibrinogen >800 (*)    All other components within normal  limits  CBG MONITORING, ED - Abnormal; Notable for the following components:   Glucose-Capillary 113 (*)    All other components within normal limits  CULTURE, BLOOD (ROUTINE X 2)  CULTURE, BLOOD (ROUTINE X 2)  LACTIC ACID, PLASMA  PROCALCITONIN  TRIGLYCERIDES  LACTIC ACID, PLASMA  FERRITIN  C-REACTIVE PROTEIN  URINALYSIS, ROUTINE W REFLEX MICROSCOPIC  ABO/RH    EKG EKG Interpretation  Date/Time:  Saturday March 11 2019 14:04:09 EST Ventricular Rate:  89 PR Interval:    QRS Duration: 75 QT Interval:  338 QTC Calculation: 412 R Axis:   54 Text Interpretation: Sinus rhythm Normal ECG since last tracing no significant change Confirmed by Noemi Chapel [phone]) on 02/14/2019 2:39:31 PM   Radiology DG Chest Port 1 View  Result Date: 02/23/2019 CLINICAL DATA:  85 year old male with progressive shortness of breath for the past 2 days. Diagnosed with COVID 1 week ago. EXAM: PORTABLE CHEST 1 VIEW COMPARISON:  Prior chest x-ray 03/04/2019 FINDINGS: Developing multifocal interstitial and airspace opacities most confluent in the periphery of the left lower lung. Changes are superimposed on a background of emphysema and chronic bronchitic changes. Cardiac and mediastinal contours are within normal limits. No acute osseous abnormality. IMPRESSION: 1. Developing bilateral patchy airspace opacities worse on the left than the right. Findings are concerning for progression 2 multifocal COVID pneumonia. 2. Background emphysema and chronic bronchitic changes. Electronically Signed   By: Jacqulynn Cadet M.D.   On: 03/08/2019 14:42     Procedures .Critical Care Performed by: Renita Papa, PA-C Authorized by: Renita Papa, PA-C   Critical care provider statement:    Critical care time (minutes):  40   Critical care was necessary to treat or prevent imminent or life-threatening deterioration of the following conditions:  Respiratory failure and renal failure   Critical care was time spent personally by me on the following activities:  Discussions with consultants, evaluation of patient's response to treatment, examination of patient, ordering and performing treatments and interventions, ordering and review of laboratory studies, ordering and review of radiographic studies, pulse oximetry, re-evaluation of patient's condition, obtaining history from patient or surrogate and review of old charts   (including critical care time)  Medications Ordered in ED Medications  insulin aspart (novoLOG) injection 0-9 Units (has no administration in time range)  insulin aspart (novoLOG) injection 0-5 Units (has no administration in time range)  insulin aspart (novoLOG) injection 3 Units (has no administration in time range)  amLODipine (NORVASC) tablet 5 mg (has no administration in time range)  aspirin EC tablet 81 mg (has no administration in time range)  terazosin (HYTRIN) capsule 5 mg (has no administration in time range)  atorvastatin (LIPITOR) tablet 40 mg (has no administration in time range)  metoprolol tartrate (LOPRESSOR) tablet 50 mg (has no administration in time range)  glipiZIDE (GLUCOTROL XL) 24 hr tablet 2.5 mg (has no administration in time range)  sodium chloride flush (NS) 0.9 % injection 3 mL (has no administration in time range)  sodium chloride flush (NS) 0.9 % injection 3 mL (has no administration in time range)  0.9 %  sodium chloride infusion (has no administration in time range)  acetaminophen (TYLENOL) tablet 650 mg (has no administration in time range)    Or  acetaminophen (TYLENOL) suppository 650 mg  (has no administration in time range)  traZODone (DESYREL) tablet 50 mg (has no administration in time range)  polyethylene glycol (MIRALAX / GLYCOLAX) packet 17 g (has no administration in time range)  ondansetron (ZOFRAN) tablet 4 mg (has no administration in time range)    Or  ondansetron (ZOFRAN) injection 4 mg (has no administration in time range)  enoxaparin (LOVENOX) injection 30 mg (has no administration in time range)  albuterol (VENTOLIN HFA) 108 (90 Base) MCG/ACT inhaler 2 puff (has no administration in time range)  methylPREDNISolone sodium succinate (SOLU-MEDROL) 125 mg/2 mL injection 41.25 mg (has no administration in time range)  guaiFENesin-dextromethorphan (ROBITUSSIN DM) 100-10 MG/5ML syrup 10 mL (has no administration in time range)  chlorpheniramine-HYDROcodone (TUSSIONEX) 10-8 MG/5ML suspension 5 mL (has no administration in time range)  ascorbic acid (VITAMIN C) tablet 500 mg (has no administration in time range)  zinc sulfate capsule 220 mg (has no administration in time range)  folic acid (FOLVITE) tablet 1 mg (has no administration in time range)  multivitamin with minerals tablet 1 tablet (has no administration in time range)  thiamine tablet 100 mg (has no administration in time range)  guaiFENesin (MUCINEX) 12 hr tablet 600 mg (has no administration in time range)  dexamethasone (DECADRON) injection 8 mg (8 mg Intravenous Given 03/09/2019 1445)  albuterol (VENTOLIN HFA) 108 (90 Base) MCG/ACT inhaler 4 puff (4 puffs Inhalation Given 02/19/2019 1445)  0.9 %  sodium chloride infusion ( Intravenous New Bag/Given 03/15/2019 1457)    ED Course  I have reviewed the triage vital signs and the nursing notes.  Pertinent labs & imaging results that were available during my care of the patient were reviewed by me and considered in my medical decision making (see chart for details).    MDM Rules/Calculators/A&P                      Nicholas Swanson was evaluated in Emergency  Department on 02/26/2019 for the symptoms described in the history of present illness. He was evaluated in the context of the global COVID-19 pandemic, which necessitated consideration that the patient might be at risk for infection with the SARS-CoV-2 virus that causes COVID-19. Institutional protocols and algorithms that pertain to the evaluation of patients at risk for COVID-19 are in a state of rapid change based on information released by regulatory bodies including the CDC and federal and state organizations. These policies and algorithms were followed during the patient's care in the ED.  Patient presenting for evaluation of progressively worsening shortness of breath, generalized weakness.  Found to be Covid positive at ED visit 1 week ago.  He is borderline febrile, persistently tachypneic in the ED.  SPO2 saturations hanging around 91 to 93% on room air and with his increased work of breathing he was placed on supplemental oxygen by myself with improvement in SPO2 saturations.  Some wheezing noted on auscultation of the lungs.  Has a history of COPD.  Will give IV Decadron and a few puffs of albuterol, unable to give breathing treatment in the setting of the COVID-19 pandemic.  Radiographs show development of multifocal pneumonia.  Lab work reviewed by me concerning for acutely elevated LFTs but abdomen is soft and nontender on assessment.  His kidney function is concerning with worsening BUN and creatinine and his potassium is a little high today at 5.4 however EKG shows normal sinus rhythm with no significant changes compared to last tracing, QT interval within normal limits.  He is likely quite dehydrated due to decreased oral intake and intermittent vomiting at home.  Started on normal saline infusion.  His inflammatory markers are elevated and his pro calcitonin is extremely high  concerning for bacterial etiology.  Will obtain UA as well as right upper quadrant ultrasound to assess for possible  hepatobiliary disease.  Question possible superimposed bacterial pneumonia secondary to Covid infection.  Spoke with Dr. Denton Brick with [facility] hospitalist service who agrees to assume care of patient and bring him into the hospital for further evaluation and management.  He is full code.  Final Clinical Impression(s) / ED Diagnoses Final diagnoses:  Elevated LFTs  COVID-19  Pneumonia due to COVID-19 virus  Renal insufficiency  Hyperkalemia    Rx / DC Orders ED Discharge Orders    None       Renita Papa, PA-C 03/02/2019 1638    Renita Papa, PA-C 03/09/2019 1639    Noemi Chapel, MD 03/12/19 [phone]

## 2019-03-11 NOTE — H&P (Signed)
Patient Demographics:    Nicholas Swanson, is a 85 y.o. male  MRN: [id number]   DOB - 01-11-1931  Admit Date - 03/05/2019  Outpatient Primary MD for the patient is Iona Beard, MD   Assessment & Plan:    Principal Problem:   Pneumonia due to COVID-19 virus Active Problems:   Acute respiratory disease due to COVID-19 virus   Essential hypertension   Type 2 diabetes mellitus with stage 3 chronic kidney disease (HCC)   Elevated LFTs   AKI (acute kidney injury) on CKD III    1)Acute Hypoxic Respiratory Failure secondary to COVID-19 infection/Pneumonia--- The treatment plan and use of medications  for treatment of COVID-19 infection and possible side effects were discussed with patient/wife Nicholas Swanson at [phone]),  explained that there is No proven definitive treatment for COVID-19 infection, any medications used here are based on published clinical articles/anecdotal data which at times and not yet peer-reviewed or randomized control trials. Complete risks and long-term side effects are unknown, however in the best clinical judgment they seem to be of some clinical benefit . --potential side effects of Remdesivir including, but not limited to allergic reaction, nausea, vomiting, elevated LFTs discussed with patient Nicholas Swanson Nicholas Swanson at [phone] discussed potential steroid side effect including elevated blood sugars, elevated blood pressure, psychosis/anxiety,  insomnia --Patient/wife Nicholas Swanson at [phone]), verbalizes understanding and agrees to treatment protocols   --Patient is positive for COVID-19 infection, chest x-ray with findings of infiltrates/opacities,  patient is hypoxic and requiring continuous supplemental oxygen---patient meets criteria for initiation of Remdesivir AND Decadron/Steroid therapy  per protocol  --Inflammatory markers are elevated including LDH of 459, ferritin is 2385, triglycerides 126, CRP is 39, D-dimer 1.68 -Fibrinogen is > 800 --Check and trend fibrinogen, CRP, pro calcitonin, CBC, BMP, d-dimer, LDH, ferritin and LFTs --Supplemental oxygen to keep O2 sats above 93% -Follow serial chest x-rays and ABGs as indicated --- Encourage prone positioning for More than 16 hours/day in increments of 2 to 3 hours at a time if able to tolerate --Attempt to maintain euvolemic state --Zinc and vitamin C as ordered -Albuterol inhaler as needed  2)Social/Ethics-- n plan of care and CODE STATUS discussed with patient and his wife-wife Nicholas Swanson at [phone]), -Patient is a full code  3)AKI----acute kidney injury on CKD stage - III     creatinine on admission=3.38  ,   baseline creatinine = (1.54 in 2013) , renally adjust medications, avoid nephrotoxic agents / dehydration /hypotension  -Hold valsartan HCTZ -Hold Metformin  4)Elevated LFTs--- Elevated LFTs noted including AST of 261 and ALT of 220 T bili is 1.0 -check acute Viral hepatitis profile, check abdominal ultrasound  5) possible superimposed bacterial infection--- procalcitonin is >> 150, lactic acid elevated at 2.7, and LFTs are elevated -Cover empirically with Rocephin and azithromycin for possible superimposed bacterial pneumonia however need to rule out cholangitis or other hepatobiliary disorders  6)DM2-no recent A1c available hold Metformin due to worsening renal function and  lactic acidosis -Continue glipizide -Anticipate worsening glycemic control with steroids for Covid pneumonia - Use Novolog/Humalog Sliding scale insulin with Accu-Cheks/Fingersticks as ordered    With History of - Reviewed by me  Past Medical History:  Diagnosis Date  . COPD (chronic obstructive pulmonary disease) ([place])   . Diabetes mellitus   . Hypertension       History reviewed. No pertinent surgical history.    Chief  Complaint  Patient presents with  . Shortness of Breath      HPI:    Nicholas Swanson  is a 85 y.o. male reformed smoker with past medical history relevant for COPD, DM2., HTN, HLD and CKD III who was diagnosed with COVID-19 infection on 03/04/2019 returns to the ED with complaints of shortness of breath and cough, vaginitis, fatigue and generalized weakness -Patient apparently also had occasional episodes of loose stool and emesis over the last couple days--emesis was nonbloody and nonbilious, -He has had fevers and chills PTA - -I called and obtained additional history from patient's wife Nicholas Swanson at [phone]),  -- In ED--O2 sats 92% on room air -Chest x-ray with bilateral pneumonia left more than right superimposed on background emphysema COPD -CBC with a white count of 7.3 H&H is 10.9 on 33.6 which is close to patient's baseline -Creatinine is up to 3.38 from 3.1 on 03/04/2019, no recent baseline creatinine available, BUN is up to 62, bicarb of 17 and potassium of 5.4 -Procalcitonin is very very elevated at more than 150, lactic acid elevated at 2.7 -Inflammatory markers are elevated including LDH of 459, ferritin is 2385, triglycerides 126, CRP is 39, D-dimer 1.68 -Fibrinogen is > 800  -Elevated LFTs noted including AST of 261 and ALT of 220, T bili is 1.0 -Liver ultrasound requested, acute viral hepatitis profile requested    Review of systems:    In addition to the HPI above,   A full Review of  Systems was done, all other systems reviewed are negative except as noted above in HPI , .    Social History:  Reviewed by me    Social History   Tobacco Use  . Smoking status: Former Smoker    Types: Cigarettes  . Smokeless tobacco: Never Used  Substance Use Topics  . Alcohol use: No      Family History :  Reviewed by me HTN   Home Medications:   Prior to Admission medications   Medication Sig Start Date End Date Taking? Authorizing Provider  albuterol  (VENTOLIN HFA) 108 (90 Base) MCG/ACT inhaler Inhale 1-2 puffs into the lungs every 6 (six) hours as needed for wheezing or shortness of breath.  02/20/19  Yes [provider]  amLODipine (NORVASC) 10 MG tablet Take 10 mg by mouth daily. 02/06/19  Yes [provider]  aspirin EC 81 MG tablet Take 81 mg by mouth daily.   Yes [provider]  glipiZIDE (GLUCOTROL XL) 2.5 MG 24 hr tablet Take 2.5 mg by mouth daily.   Yes [provider]  hydrochlorothiazide (MICROZIDE) 12.5 MG capsule Take 12.5 mg by mouth daily. 02/08/19  Yes [provider]  metFORMIN (GLUCOPHAGE) 1000 MG tablet Take 1,000 mg by mouth 2 (two) times daily with a meal.   Yes [provider]  SPS 15 GM/60ML suspension TAKE ENTIRE CONTENTSODAILY FOR ELEVATED POTASSIUM. 02/21/19  Yes [provider]  terazosin (HYTRIN) 5 MG capsule Take 5 mg by mouth at bedtime.   Yes [provider]     Allergies:  Allergies  Allergen Reactions  . Ace Inhibitors Other (See Comments)    REACTION: renal insufficiency  . Simvastatin Other (See Comments)    REACTION: myalgia     Physical Exam:   Vitals  Blood pressure 129/62, pulse 66, temperature 99.6 F (37.6 C), temperature source Oral, resp. rate (!) 22, height 6\' 1"  (1.854 m), weight 83 kg, SpO2 93 %.  Physical Examination: General appearance - alert, well appearing, and in no distress  Mental status - alert, oriented to person, place, and time, Eyes - sclera anicteric Nose- [place] 2 L/min Neck - supple, no JVD elevation , Chest -diminished bilaterally, scattered rhonchi, no wheezing  heart - S1 and S2 normal, regular  Abdomen - soft, nontender, nondistended, no masses or organomegaly Neurological - screening mental status exam normal, neck supple without rigidity, cranial nerves II through XII intact, DTR's normal and symmetric Extremities - no pedal edema noted, intact peripheral pulses  Skin - warm, dry     Data Review:    CBC Recent Labs  Lab 02/19/2019 1416  WBC 7.3  HGB 10.9*  HCT 33.6*  PLT 347  MCV 91.3  MCH 29.6  MCHC 32.4  RDW 14.6  LYMPHSABS 1.0  MONOABS 0.3  EOSABS 0.0  BASOSABS 0.0    Chemistries  Recent Labs  Lab 03/01/2019 1416  NA 136  K 5.4*  CL 105  CO2 17*  GLUCOSE 138*  BUN 62*  CREATININE 3.38*  CALCIUM 8.0*  AST 261*  ALT 220*  ALKPHOS 86  BILITOT 1.0   ------------------------------------------------------------------------------------------------------------------ estimated creatinine clearance is 17.7 mL/min (A) (by C-G formula based on SCr of 3.38 mg/dL (H)). ------------------------------------------------------------------------------------------------------------------ No results for input(s): TSH, T4TOTAL, T3FREE, THYROIDAB in the last 72 hours.  Invalid input(s): FREET3   Coagulation profile No results for input(s): INR, PROTIME in the last 168 hours. ------------------------------------------------------------------------------------------------------------------- Recent Labs    02/14/2019 1445  DDIMER 1.68*   -------------------------------------------------------------------------------------------------------------------  Cardiac Enzymes No results for input(s): CKMB, TROPONINI, MYOGLOBIN in the last 168 hours.  Invalid input(s): CK ------------------------------------------------------------------------------------------------------------------ No results found for: BNP  --------------------------------------------------------------------------------------------------------------  Urinalysis    Component Value Date/Time   COLORURINE YELLOW 03/04/2019 [place] 03/04/2019 1109   LABSPEC 1.013 03/04/2019 1109   PHURINE 7.0 03/04/2019 1109   GLUCOSEU NEGATIVE 03/04/2019 1109   HGBUR SMALL (A) 03/04/2019 1109   [place] 03/04/2019 1109   [place] 03/04/2019 1109   PROTEINUR 100 (A)  03/04/2019 1109   NITRITE NEGATIVE 03/04/2019 1109   [place] 03/04/2019 1109    ----------------------------------------------------------------------------------------------------------------   Imaging Results:    DG Chest Port 1 View  Result Date: 02/18/2019 CLINICAL DATA:  85 year old male with progressive shortness of breath for the past 2 days. Diagnosed with COVID 1 week ago. EXAM: PORTABLE CHEST 1 VIEW COMPARISON:  Prior chest x-ray 03/04/2019 FINDINGS: Developing multifocal interstitial and airspace opacities most confluent in the periphery of the left lower lung. Changes are superimposed on a background of emphysema and chronic bronchitic changes. Cardiac and mediastinal contours are within normal limits. No acute osseous abnormality. IMPRESSION: 1. Developing bilateral patchy airspace opacities worse on the left than the right. Findings are concerning for progression 2 multifocal COVID pneumonia. 2. Background emphysema and chronic bronchitic changes. Electronically Signed   By: Jacqulynn Cadet M.D.   On: 03/10/2019 14:42    Radiological Exams on Admission: DG Chest Port 1 View  Result Date: 03/08/2019 CLINICAL DATA:  85 year old male with progressive shortness of breath for the  past 2 days. Diagnosed with COVID 1 week ago. EXAM: PORTABLE CHEST 1 VIEW COMPARISON:  Prior chest x-ray 03/04/2019 FINDINGS: Developing multifocal interstitial and airspace opacities most confluent in the periphery of the left lower lung. Changes are superimposed on a background of emphysema and chronic bronchitic changes. Cardiac and mediastinal contours are within normal limits. No acute osseous abnormality. IMPRESSION: 1. Developing bilateral patchy airspace opacities worse on the left than the right. Findings are concerning for progression 2 multifocal COVID pneumonia. 2. Background emphysema and chronic bronchitic changes. Electronically Signed   By: Jacqulynn Cadet M.D.   On: 03/04/2019  14:42   DVT Prophylaxis -SCD /lovenox AM Labs Ordered, also please review Full Orders  Family Communication: Admission, patients condition and plan of care including tests being ordered have been discussed with the patient and wife who indicate understanding and agree with the plan   Code Status - Full Code  Likely DC to  TBD  Condition   stable  Roxan Hockey M.D on 03/12/2019 at 8:59 PM Go to [email or web address] -  for contact info  [facility] - Office  [phone]

## 2019-03-11 NOTE — ED Notes (Signed)
Date and time results received: 03/16/2019 1723 (use smartphrase ".now" to insert current time)  Test: lac acid Critical Value: 2.7  Name of Provider Notified: e courage md  Orders Received? Or Actions Taken?: see chart

## 2019-03-12 ENCOUNTER — Inpatient Hospital Stay (HOSPITAL_COMMUNITY): Payer: Medicare Other

## 2019-03-12 DIAGNOSIS — U071 COVID-19: Secondary | ICD-10-CM | POA: Diagnosis not present

## 2019-03-12 DIAGNOSIS — N1831 Chronic kidney disease, stage 3a: Secondary | ICD-10-CM

## 2019-03-12 DIAGNOSIS — E1121 Type 2 diabetes mellitus with diabetic nephropathy: Secondary | ICD-10-CM

## 2019-03-12 DIAGNOSIS — E785 Hyperlipidemia, unspecified: Secondary | ICD-10-CM | POA: Diagnosis not present

## 2019-03-12 DIAGNOSIS — Z8249 Family history of ischemic heart disease and other diseases of the circulatory system: Secondary | ICD-10-CM | POA: Diagnosis not present

## 2019-03-12 DIAGNOSIS — J439 Emphysema, unspecified: Secondary | ICD-10-CM | POA: Diagnosis not present

## 2019-03-12 DIAGNOSIS — Z888 Allergy status to other drugs, medicaments and biological substances status: Secondary | ICD-10-CM | POA: Diagnosis not present

## 2019-03-12 DIAGNOSIS — E875 Hyperkalemia: Secondary | ICD-10-CM | POA: Diagnosis not present

## 2019-03-12 DIAGNOSIS — N179 Acute kidney failure, unspecified: Secondary | ICD-10-CM

## 2019-03-12 DIAGNOSIS — J9601 Acute respiratory failure with hypoxia: Secondary | ICD-10-CM | POA: Diagnosis not present

## 2019-03-12 DIAGNOSIS — E1122 Type 2 diabetes mellitus with diabetic chronic kidney disease: Secondary | ICD-10-CM | POA: Diagnosis not present

## 2019-03-12 DIAGNOSIS — R7989 Other specified abnormal findings of blood chemistry: Secondary | ICD-10-CM

## 2019-03-12 DIAGNOSIS — I10 Essential (primary) hypertension: Secondary | ICD-10-CM

## 2019-03-12 DIAGNOSIS — I129 Hypertensive chronic kidney disease with stage 1 through stage 4 chronic kidney disease, or unspecified chronic kidney disease: Secondary | ICD-10-CM | POA: Diagnosis not present

## 2019-03-12 DIAGNOSIS — J069 Acute upper respiratory infection, unspecified: Secondary | ICD-10-CM

## 2019-03-12 DIAGNOSIS — E1165 Type 2 diabetes mellitus with hyperglycemia: Secondary | ICD-10-CM | POA: Diagnosis not present

## 2019-03-12 DIAGNOSIS — J159 Unspecified bacterial pneumonia: Secondary | ICD-10-CM | POA: Diagnosis not present

## 2019-03-12 LAB — CBC WITH DIFFERENTIAL/PLATELET
Abs Immature Granulocytes: 0.15 10*3/uL — ABNORMAL HIGH (ref 0.00–0.07)
Basophils Absolute: 0 10*3/uL (ref 0.0–0.1)
Basophils Relative: 0 %
Eosinophils Absolute: 0 10*3/uL (ref 0.0–0.5)
Eosinophils Relative: 0 %
HCT: 31.9 % — ABNORMAL LOW (ref 39.0–52.0)
Hemoglobin: 10.2 g/dL — ABNORMAL LOW (ref 13.0–17.0)
Immature Granulocytes: 2 %
Lymphocytes Relative: 10 %
Lymphs Abs: 0.6 10*3/uL — ABNORMAL LOW (ref 0.7–4.0)
MCH: 29.2 pg (ref 26.0–34.0)
MCHC: 32 g/dL (ref 30.0–36.0)
MCV: 91.4 fL (ref 80.0–100.0)
Monocytes Absolute: 0.2 10*3/uL (ref 0.1–1.0)
Monocytes Relative: 3 %
Neutro Abs: 5.2 10*3/uL (ref 1.7–7.7)
Neutrophils Relative %: 85 %
Platelets: 338 10*3/uL (ref 150–400)
RBC: 3.49 MIL/uL — ABNORMAL LOW (ref 4.22–5.81)
RDW: 14.8 % (ref 11.5–15.5)
WBC: 6.1 10*3/uL (ref 4.0–10.5)
nRBC: 0.3 % — ABNORMAL HIGH (ref 0.0–0.2)

## 2019-03-12 LAB — COMPREHENSIVE METABOLIC PANEL
ALT: 154 U/L — ABNORMAL HIGH (ref 0–44)
AST: 121 U/L — ABNORMAL HIGH (ref 15–41)
Albumin: 2.7 g/dL — ABNORMAL LOW (ref 3.5–5.0)
Alkaline Phosphatase: 72 U/L (ref 38–126)
Anion gap: 14 (ref 5–15)
BUN: 65 mg/dL — ABNORMAL HIGH (ref 8–23)
CO2: 14 mmol/L — ABNORMAL LOW (ref 22–32)
Calcium: 7.8 mg/dL — ABNORMAL LOW (ref 8.9–10.3)
Chloride: 109 mmol/L (ref 98–111)
Creatinine, Ser: 2.87 mg/dL — ABNORMAL HIGH (ref 0.61–1.24)
GFR calc Af Amer: 22 mL/min — ABNORMAL LOW (ref >60)
GFR calc non Af Amer: 19 mL/min — ABNORMAL LOW (ref >60)
Glucose, Bld: 154 mg/dL — ABNORMAL HIGH (ref 70–99)
Potassium: 5.5 mmol/L — ABNORMAL HIGH (ref 3.5–5.1)
Sodium: 137 mmol/L (ref 135–145)
Total Bilirubin: 0.8 mg/dL (ref 0.3–1.2)
Total Protein: 6.8 g/dL (ref 6.5–8.1)

## 2019-03-12 LAB — C-REACTIVE PROTEIN: CRP: 31.6 mg/dL — ABNORMAL HIGH (ref <1.0)

## 2019-03-12 LAB — PHOSPHORUS: Phosphorus: 4.4 mg/dL (ref 2.5–4.6)

## 2019-03-12 LAB — CBG MONITORING, ED: Glucose-Capillary: 124 mg/dL — ABNORMAL HIGH (ref 70–99)

## 2019-03-12 LAB — URINALYSIS, ROUTINE W REFLEX MICROSCOPIC
Bacteria, UA: NONE SEEN
Bilirubin Urine: NEGATIVE
Glucose, UA: NEGATIVE mg/dL
Ketones, ur: 5 mg/dL — AB
Leukocytes,Ua: NEGATIVE
Nitrite: NEGATIVE
Protein, ur: 100 mg/dL — AB
Specific Gravity, Urine: 1.016 (ref 1.005–1.030)
pH: 5 (ref 5.0–8.0)

## 2019-03-12 LAB — FERRITIN: Ferritin: 1660 ng/mL — ABNORMAL HIGH (ref 24–336)

## 2019-03-12 LAB — HEPATITIS PANEL, ACUTE
HCV Ab: NONREACTIVE
Hep A IgM: NONREACTIVE
Hep B C IgM: NONREACTIVE
Hepatitis B Surface Ag: NONREACTIVE

## 2019-03-12 LAB — D-DIMER, QUANTITATIVE: D-Dimer, Quant: 1.24 ug/mL-FEU — ABNORMAL HIGH (ref 0.00–0.50)

## 2019-03-12 LAB — GLUCOSE, CAPILLARY
Glucose-Capillary: 149 mg/dL — ABNORMAL HIGH (ref 70–99)
Glucose-Capillary: 131 mg/dL — ABNORMAL HIGH (ref 70–99)

## 2019-03-12 LAB — HEMOGLOBIN A1C
Hgb A1c MFr Bld: 6.4 % — ABNORMAL HIGH (ref 4.8–5.6)
Mean Plasma Glucose: 136.98 mg/dL

## 2019-03-12 LAB — MAGNESIUM: Magnesium: 2.5 mg/dL — ABNORMAL HIGH (ref 1.7–2.4)

## 2019-03-12 MED ORDER — METOPROLOL SUCCINATE ER 25 MG PO TB24
50.0000 mg | ORAL_TABLET | Freq: Every day | ORAL | Status: DC
  Administered 2019-03-13 – 2019-03-20 (×8): 50 mg via ORAL
  Filled 2019-03-12 (×8): qty 2

## 2019-03-12 NOTE — Progress Notes (Signed)
PROGRESS NOTE    Nicholas Swanson  ZSW:[id number] DOB: [birth year] DOA: 02/23/2019 PCP: Iona Beard, MD     Brief Narrative:  As per H&P written by Dr. Denton Brick on 02/14/2019 85 y.o. male reformed smoker with past medical history relevant for COPD, DM2., HTN, HLD and CKD III who was diagnosed with COVID-19 infection on 03/04/2019 returns to the ED with complaints of shortness of breath and cough, vaginitis, fatigue and generalized weakness -Patient apparently also had occasional episodes of loose stool and emesis over the last couple days--emesis was nonbloody and nonbilious, -He has had fevers and chills PTA - -I called and obtained additional history from patient's wife Nicholas Swanson at [phone]),   -- In ED--O2 sats 92% on room air -Chest x-ray with bilateral pneumonia left more than right superimposed on background emphysema COPD -CBC with a white count of 7.3 H&H is 10.9 on 33.6 which is close to patient's baseline -Creatinine is up to 3.38 from 3.1 on 03/04/2019, no recent baseline creatinine available, BUN is up to 62, bicarb of 17 and potassium of 5.4 -Procalcitonin is very very elevated at more than 150, lactic acid elevated at 2.7 -Inflammatory markers are elevated including LDH of 459, ferritin is 2385, triglycerides 126, CRP is 39, D-dimer 1.68 -Fibrinogen is > 800  -Elevated LFTs noted including AST of 261 and ALT of 220, T bili is 1.0 -Liver ultrasound requested, acute viral hepatitis profile requested  Assessment & Plan: 1-Acute respiratory failure with hypoxia in the setting of Pneumonia due to COVID-19 virus -Still short of breath and tachypneic -Requiring 3-3.5 L oxygen supplementation -No fever, no chest pain, no nausea, no vomiting. -Continue steroids, remdesivir, IV fluids, as needed inhalers and supportive care. -Continue to follow inflammatory markers closely  2-acute on chronic renal failure (stage IIIa at baseline) -Previous baseline creatinine from 2013,  so most likely the reason for the progression in his underlying chronic renal condition. -Continue IV fluids -Minimize nephrotoxic agents and avoid hypotension. -Follow renal function trend.  3-essential hypertension -Blood pressure stable -Continue to follow vital signs -Valsartan and HCTZ discontinued the setting of acute on chronic renal failure.  4-Type 2 diabetes mellitus with stage 3 chronic kidney disease (HCC) -Follow A1c -Continue sliding scale insulin.  5-Elevated LFTs -Follow liver ultrasound and hepatitis panel -Patient denies nausea, vomiting or abdominal pain. -No icterus on exam. -LFTs trending down after fluid resuscitation; continue monitoring. -Most likely hepatic dysfunction in the setting of acute infection.  6-possible superimposed bacterial infection -Procalcitonin and CRP was elevated -Lactic acid and ferritin as well. -Continue IV fluids and empirical antibiotics using Rocephin and Zithromax -Urinalysis no suggesting infection. -follow culture results.    DVT prophylaxis: Lovenox Code Status: Full code Family Communication: Wife contacted over the phone. Disposition Plan: Patient remains inpatient, will be transferred to [facility] for further evaluation and management.  Continue IV antibiotics, steroids, remdesivir, IV fluids and supportive care.  Consultants:   None  Procedures:   See below for x-ray report.  Antimicrobials:  Anti-infectives (From admission, onward)   Start     Dose/Rate Route Frequency Ordered Stop   03/12/19 1000  remdesivir 100 mg in sodium chloride 0.9 % 100 mL IVPB     100 mg 200 mL/hr over 30 Minutes Intravenous Daily 03/08/2019 1855 03/16/19 0959   03/04/2019 2100  cefTRIAXone (ROCEPHIN) 1 g in sodium chloride 0.9 % 100 mL IVPB     1 g 200 mL/hr over 30 Minutes Intravenous Every 24 hours 02/25/2019 2057  03/15/2019 2100  azithromycin (ZITHROMAX) 500 mg in sodium chloride 0.9 % 250 mL IVPB     500 mg 250 mL/hr  over 60 Minutes Intravenous Every 24 hours 02/18/2019 2057     03/04/2019 1900  remdesivir 200 mg in sodium chloride 0.9% 250 mL IVPB     200 mg 580 mL/hr over 30 Minutes Intravenous Once 03/15/2019 1855 03/04/2019 2015       Subjective: Afebrile, still short of breath with tachypnea.  Using 3-3.5 nasal cannula supplementation.  No nausea, no vomiting.  Reports poor appetite.  No chest pain  Objective: Vitals:   03/12/19 0730 03/12/19 0745 03/12/19 0800 03/12/19 0815  BP:      Pulse: 75 99 88 82  Resp: (!) 23 (!) 25    Temp:      TempSrc:      SpO2: 94% 96% 96% 96%  Weight:      Height:        Intake/Output Summary (Last 24 hours) at 03/12/2019 0958 Last data filed at 02/21/2019 2237 Gross per 24 hour  Intake 2871.08 ml  Output --  Net 2871.08 ml   Filed Weights   03/14/2019 1402  Weight: 83 kg    Examination: General exam: Alert, awake, oriented x 3; afebrile, reports still feeling short of breath, no chest pain, no nausea or vomiting.  Appetite is poor. Respiratory system: Fair air movement bilaterally, positive rhonchi and tachypnea; using 3-3.5 L nasal cannula supplementation at rest; normal respiratory effort. Cardiovascular system: RRR. No murmurs, rubs, gallops. Gastrointestinal system: Abdomen is nondistended, soft and nontender. No organomegaly or masses felt. Normal bowel sounds heard. Central nervous system: Alert and oriented. No focal neurological deficits. Extremities: No cyanosis or clubbing; no edema. Skin: No rashes, no petechiae. Psychiatry: Judgement and insight appear normal. Mood & affect appropriate.    Data Reviewed: I have personally reviewed following labs and imaging studies  CBC: Recent Labs  Lab 03/13/2019 1416 03/12/19 0533  WBC 7.3 6.1  NEUTROABS 5.9 5.2  HGB 10.9* 10.2*  HCT 33.6* 31.9*  MCV 91.3 91.4  PLT 347 244   Basic Metabolic Panel: Recent Labs  Lab 03/14/2019 1416 03/12/19 0533  NA 136 137  K 5.4* 5.5*  CL 105 109  CO2 17*  14*  GLUCOSE 138* 154*  BUN 62* 65*  CREATININE 3.38* 2.87*  CALCIUM 8.0* 7.8*  MG  --  2.5*  PHOS  --  4.4   GFR: Estimated Creatinine Clearance: 20.9 mL/min (A) (by C-G formula based on SCr of 2.87 mg/dL (H)).   Liver Function Tests: Recent Labs  Lab 03/02/2019 1416 03/12/19 0533  AST 261* 121*  ALT 220* 154*  ALKPHOS 86 72  BILITOT 1.0 0.8  PROT 7.5 6.8  ALBUMIN 2.9* 2.7*   CBG: Recent Labs  Lab 03/09/2019 1619 03/16/2019 2249 03/12/19 0814  GLUCAP 113* 129* 124*   Lipid Profile: Recent Labs    02/28/2019 1416  TRIG 126   Anemia Panel: Recent Labs    03/14/2019 1416 03/12/19 0534  FERRITIN 2,385* 1,660*   Urine analysis:    Component Value Date/Time   COLORURINE YELLOW 02/26/2019 1416   APPEARANCEUR CLEAR 02/27/2019 1416   LABSPEC 1.016 02/24/2019 1416   PHURINE 5.0 03/07/2019 1416   GLUCOSEU NEGATIVE 03/03/2019 1416   HGBUR MODERATE (A) 02/21/2019 1416   BILIRUBINUR NEGATIVE 02/22/2019 1416   KETONESUR 5 (A) 02/24/2019 1416   PROTEINUR 100 (A) 02/27/2019 1416   NITRITE NEGATIVE 02/16/2019 1416   LEUKOCYTESUR  NEGATIVE 03/05/2019 1416    Recent Results (from the past 240 hour(s))  Blood Culture (routine x 2)     Status: None (Preliminary result)   Collection Time: 03/13/2019  2:45 PM   Specimen: Blood  Result Value Ref Range Status   Specimen Description   Final    RIGHT ANTECUBITAL BOTTLES DRAWN AEROBIC AND ANAEROBIC   Special Requests   Final    Blood Culture adequate volume Performed at [facility], [address] [place], [place] [address]    Culture PENDING  Incomplete   Report Status PENDING  Incomplete  Blood Culture (routine x 2)     Status: None (Preliminary result)   Collection Time: 02/18/2019  2:59 PM   Specimen: Blood  Result Value Ref Range Status   Specimen Description   Final    BLOOD RIGHT WRIST BOTTLES DRAWN AEROBIC AND ANAEROBIC   Special Requests   Final    Blood Culture adequate volume Performed at [facility], [address] [place], [place] [address]    Culture PENDING  Incomplete   Report Status PENDING  Incomplete     Radiology Studies: DG Chest Port 1 View  Result Date: 03/13/2019 CLINICAL DATA:  85 year old male with progressive shortness of breath for the past 2 days. Diagnosed with COVID 1 week ago. EXAM: PORTABLE CHEST 1 VIEW COMPARISON:  Prior chest x-ray 03/04/2019 FINDINGS: Developing multifocal interstitial and airspace opacities most confluent in the periphery of the left lower lung. Changes are superimposed on a background of emphysema and chronic bronchitic changes. Cardiac and mediastinal contours are within normal limits. No acute osseous abnormality. IMPRESSION: 1. Developing bilateral patchy airspace opacities worse on the left than the right. Findings are concerning for progression 2 multifocal COVID pneumonia. 2. Background emphysema and chronic bronchitic changes. Electronically Signed   By: Jacqulynn Cadet M.D.   On: 02/23/2019 14:42   US Abdomen Limited RUQ  Result Date: 03/12/2019 CLINICAL DATA:  85 year old male with elevated LFTs EXAM: ULTRASOUND ABDOMEN LIMITED RIGHT UPPER QUADRANT COMPARISON:  None. FINDINGS: Gallbladder: No gallstones or wall thickening visualized. No sonographic Murphy sign noted by sonographer. Common bile duct: Diameter: Normal for age at 6 mm Liver: No focal lesion identified. Within normal limits in parenchymal echogenicity. Portal vein is patent on color Doppler imaging with normal direction of blood flow towards the liver. Other: Incidental imaging of the right kidney demonstrates increased parenchymal echogenicity. IMPRESSION: 1. Normal sonographic appearance of the liver. 2. Increased parenchymal echogenicity of the right kidney suggesting underlying medical renal disease. Electronically Signed   By: Jacqulynn Cadet M.D.   On: 03/12/2019 09:16    Scheduled Meds: . albuterol  2 puff Inhalation Q6H  . amLODipine  5 mg Oral Daily  . vitamin C  500 mg Oral  Daily  . aspirin EC  81 mg Oral Daily  . atorvastatin  40 mg Oral Daily  . enoxaparin (LOVENOX) injection  30 mg Subcutaneous Q24H  . folic acid  1 mg Oral Daily  . glipiZIDE  2.5 mg Oral Daily  . guaiFENesin  600 mg Oral BID  . insulin aspart  0-5 Units Subcutaneous QHS  . insulin aspart  0-9 Units Subcutaneous TID WC  . insulin aspart  3 Units Subcutaneous TID WC  . methylPREDNISolone (SOLU-MEDROL) injection  0.5 mg/kg Intravenous Q12H  . metoprolol tartrate  50 mg Oral Daily  . multivitamin with minerals  1 tablet Oral Daily  . sodium chloride flush  3 mL Intravenous Q12H  .  terazosin  5 mg Oral QHS  . thiamine  100 mg Oral Daily  . zinc sulfate  220 mg Oral Daily   Continuous Infusions: . sodium chloride    . azithromycin 500 mg (03/05/2019 2236)  . cefTRIAXone (ROCEPHIN)  IV Stopped (02/20/2019 2232)  . remdesivir 100 mg in NS 100 mL       LOS: 1 day    Time spent: 35 minutes. Greater than 50% of this time was spent in direct contact with the patient, coordinating care and discussing relevant ongoing clinical issues, including COVID-19 infection pneumonia with acute hypoxemic respiratory failure; discussion about worsening renal function and elevated LFTs.  Patient is still requiring 3.5 nasal cannula oxygen supplementation, expressing been short of breath and having nonproductive cough; but denies fever, chest pain, nausea, vomiting, abdominal pain, diarrhea or any other complaints currently.   Barton Dubois, MD [facility] Pager [phone]   03/12/2019, 9:58 AM

## 2019-03-12 NOTE — ED Notes (Signed)
Spoke with [facility] states that a truck should be on it's way in about 35 min.

## 2019-03-12 NOTE — ED Notes (Signed)
Patient left unit with [facility].  Family updated per status

## 2019-03-13 LAB — COMPREHENSIVE METABOLIC PANEL
ALT: 99 U/L — ABNORMAL HIGH (ref 0–44)
AST: 60 U/L — ABNORMAL HIGH (ref 15–41)
Albumin: 2.5 g/dL — ABNORMAL LOW (ref 3.5–5.0)
Alkaline Phosphatase: 63 U/L (ref 38–126)
Anion gap: 16 — ABNORMAL HIGH (ref 5–15)
BUN: 84 mg/dL — ABNORMAL HIGH (ref 8–23)
CO2: 15 mmol/L — ABNORMAL LOW (ref 22–32)
Calcium: 8 mg/dL — ABNORMAL LOW (ref 8.9–10.3)
Chloride: 106 mmol/L (ref 98–111)
Creatinine, Ser: 2.94 mg/dL — ABNORMAL HIGH (ref 0.61–1.24)
GFR calc Af Amer: 21 mL/min — ABNORMAL LOW (ref >60)
GFR calc non Af Amer: 18 mL/min — ABNORMAL LOW (ref >60)
Glucose, Bld: 127 mg/dL — ABNORMAL HIGH (ref 70–99)
Potassium: 5.5 mmol/L — ABNORMAL HIGH (ref 3.5–5.1)
Sodium: 137 mmol/L (ref 135–145)
Total Bilirubin: 0.7 mg/dL (ref 0.3–1.2)
Total Protein: 6.2 g/dL — ABNORMAL LOW (ref 6.5–8.1)

## 2019-03-13 LAB — PHOSPHORUS: Phosphorus: 4.5 mg/dL (ref 2.5–4.6)

## 2019-03-13 LAB — GLUCOSE, CAPILLARY
Glucose-Capillary: 136 mg/dL — ABNORMAL HIGH (ref 70–99)
Glucose-Capillary: 150 mg/dL — ABNORMAL HIGH (ref 70–99)
Glucose-Capillary: 215 mg/dL — ABNORMAL HIGH (ref 70–99)
Glucose-Capillary: 198 mg/dL — ABNORMAL HIGH (ref 70–99)
Glucose-Capillary: 183 mg/dL — ABNORMAL HIGH (ref 70–99)

## 2019-03-13 LAB — CBC WITH DIFFERENTIAL/PLATELET
Abs Immature Granulocytes: 0.17 10*3/uL — ABNORMAL HIGH (ref 0.00–0.07)
Basophils Absolute: 0 10*3/uL (ref 0.0–0.1)
Basophils Relative: 0 %
Eosinophils Absolute: 0 10*3/uL (ref 0.0–0.5)
Eosinophils Relative: 0 %
HCT: 27.2 % — ABNORMAL LOW (ref 39.0–52.0)
Hemoglobin: 8.9 g/dL — ABNORMAL LOW (ref 13.0–17.0)
Immature Granulocytes: 3 %
Lymphocytes Relative: 12 %
Lymphs Abs: 0.7 10*3/uL (ref 0.7–4.0)
MCH: 29.4 pg (ref 26.0–34.0)
MCHC: 32.7 g/dL (ref 30.0–36.0)
MCV: 89.8 fL (ref 80.0–100.0)
Monocytes Absolute: 0.3 10*3/uL (ref 0.1–1.0)
Monocytes Relative: 6 %
Neutro Abs: 4.6 10*3/uL (ref 1.7–7.7)
Neutrophils Relative %: 79 %
Platelets: 340 10*3/uL (ref 150–400)
RBC: 3.03 MIL/uL — ABNORMAL LOW (ref 4.22–5.81)
RDW: 15.1 % (ref 11.5–15.5)
WBC: 5.8 10*3/uL (ref 4.0–10.5)
nRBC: 0.7 % — ABNORMAL HIGH (ref 0.0–0.2)

## 2019-03-13 LAB — D-DIMER, QUANTITATIVE: D-Dimer, Quant: 1.55 ug/mL-FEU — ABNORMAL HIGH (ref 0.00–0.50)

## 2019-03-13 LAB — C-REACTIVE PROTEIN: CRP: 15.8 mg/dL — ABNORMAL HIGH (ref <1.0)

## 2019-03-13 LAB — FERRITIN: Ferritin: 957 ng/mL — ABNORMAL HIGH (ref 24–336)

## 2019-03-13 LAB — MAGNESIUM: Magnesium: 2.7 mg/dL — ABNORMAL HIGH (ref 1.7–2.4)

## 2019-03-13 NOTE — Plan of Care (Signed)
Updated pt on POC, pt verbalized understanding 

## 2019-03-13 NOTE — Progress Notes (Signed)
Transported pt via bed to room 307, NAD noted, care handed off to [facility].  All belongings with pt including cell phone and charging cable. 03/13/2019 [place], RN

## 2019-03-13 NOTE — Plan of Care (Signed)
Uneventful shift for patient, remains AOx4, lack of appetite with last nights dinner may benefit from ensure, vitals wnl, has 3rd dose of REm-D scheduled for today, safety precautions maintained, continue plan of care.  Problem: Education: Goal: Knowledge of General Education information will improve Description: Including pain rating scale, medication(s)/side effects and non-pharmacologic comfort measures Outcome: Progressing   Problem: Health Behavior/Discharge Planning: Goal: Ability to manage health-related needs will improve Outcome: Progressing   Problem: Clinical Measurements: Goal: Ability to maintain clinical measurements within normal limits will improve Outcome: Progressing Goal: Will remain free from infection Outcome: Progressing Goal: Diagnostic test results will improve Outcome: Progressing Goal: Respiratory complications will improve Outcome: Progressing Goal: Cardiovascular complication will be avoided Outcome: Progressing   Problem: Activity: Goal: Risk for activity intolerance will decrease Outcome: Progressing   Problem: Nutrition: Goal: Adequate nutrition will be maintained Outcome: Progressing   Problem: Coping: Goal: Level of anxiety will decrease Outcome: Progressing   Problem: Elimination: Goal: Will not experience complications related to bowel motility Outcome: Progressing Goal: Will not experience complications related to urinary retention Outcome: Progressing   Problem: Pain Managment: Goal: General experience of comfort will improve Outcome: Progressing   Problem: Safety: Goal: Ability to remain free from injury will improve Outcome: Progressing   Problem: Skin Integrity: Goal: Risk for impaired skin integrity will decrease Outcome: Progressing   Problem: Education: Goal: Knowledge of risk factors and measures for prevention of condition will improve Outcome: Progressing   Problem: Coping: Goal: Psychosocial and spiritual needs  will be supported Outcome: Progressing   Problem: Respiratory: Goal: Will maintain a patent airway Outcome: Progressing Goal: Complications related to the disease process, condition or treatment will be avoided or minimized Outcome: Progressing

## 2019-03-13 NOTE — Progress Notes (Signed)
PROGRESS NOTE    Nicholas Swanson  SEG:[id number] DOB: 1930-10-25 DOA: 03/08/2019 PCP: Iona Beard, MD    Brief Narrative:  85 year old gentleman former smoker, history of COPD, type 2 diabetes, hypertension, hyperlipidemia, stage III CKD was diagnosed with COVID-19 on 03/04/2019, return to hospital with shortness of breath, cough and fatigue with generalized weakness.  Patient is poor historian.  Also had nausea and vomiting.  He was very lethargic at home as per patient's wife. In the emergency room 92% on room air, chest x-ray shows bilateral pneumonia left more than right.  Creatinine was 3.38.  Potassium 5.4.  Procalcitonin more than 150.  Lactic acid 2.7.  Started on steroid and remdesivir and admitted to [facility].   Assessment & Plan:   Principal Problem:   Pneumonia due to COVID-19 virus Active Problems:   Essential hypertension   Acute respiratory disease due to COVID-19 virus   Type 2 diabetes mellitus with stage 3 chronic kidney disease (HCC)   Elevated LFTs   AKI (acute kidney injury) on CKD III  Pneumonia due to COVID-19 virus: Continue to monitor due to significant symptoms on presentation. chest physiotherapy, incentive spirometry, deep breathing exercises, sputum induction, mucolytic's and bronchodilators.  Mobilize today with PT OT. Supplemental oxygen to keep saturations more than 90%. Covid directed therapy with , steroids, Solu-Medrol remdesivir, day 2/5 antibiotics, on Rocephin and azithromycin due to very high procalcitonin.  We will continue until final cultures available or clinical improvement. Due to severity of symptoms, patient will need daily inflammatory markers,  liver function test to monitor and direct COVID-19 therapies.  Acute on chronic renal failure: Stage IIIa at baseline. Treated with gentle IV fluids with improvement.  Potassium is 5.5.  Creatinine is trending down.  This is probably his baseline.  Continue  monitoring.  Hypertension: Blood pressure stable.  Holding valsartan and hydrochlorothiazide due to AKI.  Type 2 diabetes with hyperglycemia: Aggravated by insulin use.  Continue glipizide and cover with sliding scale.  Elevated LFTs: Normal liver ultrasound.  Hepatitis panel pending.  Probably due to acute COVID-19 infection.  Trending down.  We will continue monitoring.  DVT prophylaxis: Lovenox subcu Code Status: Full code Family Communication: Wife [name] called and updated Disposition Plan: Pending clinical improvement.  Work with PT OT today.  Discontinue telemetry.  Can transfer to [facility] bed.   Consultants:   None  Procedures:   None  Antimicrobials:  Anti-infectives (From admission, onward)   Start     Dose/Rate Route Frequency Ordered Stop   03/12/19 1000  remdesivir 100 mg in sodium chloride 0.9 % 100 mL IVPB     100 mg 200 mL/hr over 30 Minutes Intravenous Daily 02/27/2019 1855 03/16/19 0959   03/02/2019 2100  cefTRIAXone (ROCEPHIN) 1 g in sodium chloride 0.9 % 100 mL IVPB     1 g 200 mL/hr over 30 Minutes Intravenous Every 24 hours 03/10/2019 2057     02/16/2019 2100  azithromycin (ZITHROMAX) 500 mg in sodium chloride 0.9 % 250 mL IVPB     500 mg 250 mL/hr over 60 Minutes Intravenous Every 24 hours 03/15/2019 2057     03/02/2019 1900  remdesivir 200 mg in sodium chloride 0.9% 250 mL IVPB     200 mg 580 mL/hr over 30 Minutes Intravenous Once 02/14/2019 1855 02/28/2019 2015         Subjective: Patient seen and examined.  No overnight events.  Poor historian.  He denied any complaints.  He was not sure  what brought him to the hospital. Remains afebrile.  Mostly on room air.  Objective: Vitals:   03/13/19 0500 03/13/19 0735 03/13/19 0800 03/13/19 0854  BP:   (!) 142/67   Pulse:   81 92  Resp:   20   Temp:  98 F (36.7 C)    TempSrc:  Oral    SpO2:   96%   Weight: 82.4 kg     Height:        Intake/Output Summary (Last 24 hours) at 03/13/2019 1131 Last data  filed at 03/13/2019 1052 Gross per 24 hour  Intake 610.95 ml  Output 250 ml  Net 360.95 ml   Filed Weights   03/10/2019 1402 03/13/19 0500  Weight: 83 kg 82.4 kg    Examination:  General exam: Appears calm and comfortable, on room air. Respiratory system: Clear to auscultation. Respiratory effort normal.  No added sounds. Cardiovascular system: S1 & S2 heard, RRR. No JVD, murmurs, rubs, gallops or clicks. No pedal edema. Gastrointestinal system: Abdomen is nondistended, soft and nontender. No organomegaly or masses felt. Normal bowel sounds heard. Central nervous system: Alert and oriented x2.  No focal neurological deficits. Extremities: Symmetric 5 x 5 power. Skin: No rashes, lesions or ulcers Psychiatry: Judgement and insight appear normal. Mood & affect appropriate.     Data Reviewed: I have personally reviewed following labs and imaging studies  CBC: Recent Labs  Lab 03/07/2019 1416 03/12/19 0533 03/13/19 0420  WBC 7.3 6.1 5.8  NEUTROABS 5.9 5.2 4.6  HGB 10.9* 10.2* 8.9*  HCT 33.6* 31.9* 27.2*  MCV 91.3 91.4 89.8  PLT 347 338 976   Basic Metabolic Panel: Recent Labs  Lab 02/23/2019 1416 03/12/19 0533 03/13/19 0420  NA 136 137 137  K 5.4* 5.5* 5.5*  CL 105 109 106  CO2 17* 14* 15*  GLUCOSE 138* 154* 127*  BUN 62* 65* 84*  CREATININE 3.38* 2.87* 2.94*  CALCIUM 8.0* 7.8* 8.0*  MG  --  2.5* 2.7*  PHOS  --  4.4 4.5   GFR: Estimated Creatinine Clearance: 20.4 mL/min (A) (by C-G formula based on SCr of 2.94 mg/dL (H)). Liver Function Tests: Recent Labs  Lab 02/28/2019 1416 03/12/19 0533 03/13/19 0420  AST 261* 121* 60*  ALT 220* 154* 99*  ALKPHOS 86 72 63  BILITOT 1.0 0.8 0.7  PROT 7.5 6.8 6.2*  ALBUMIN 2.9* 2.7* 2.5*   No results for input(s): LIPASE, AMYLASE in the last 168 hours. No results for input(s): AMMONIA in the last 168 hours. Coagulation Profile: No results for input(s): INR, PROTIME in the last 168 hours. Cardiac Enzymes: No results for  input(s): CKTOTAL, CKMB, CKMBINDEX, TROPONINI in the last 168 hours. BNP (last 3 results) No results for input(s): PROBNP in the last 8760 hours. HbA1C: Recent Labs    03/12/19 0533  HGBA1C 6.4*   CBG: Recent Labs  Lab 03/09/2019 2249 03/12/19 0814 03/12/19 1706 03/12/19 2148 03/13/19 0731  GLUCAP 129* 124* 149* 131* 136*   Lipid Profile: Recent Labs    03/01/2019 1416  TRIG 126   Thyroid Function Tests: No results for input(s): TSH, T4TOTAL, FREET4, T3FREE, THYROIDAB in the last 72 hours. Anemia Panel: Recent Labs    03/12/19 0534 03/13/19 0420  FERRITIN 1,660* 957*   Sepsis Labs: Recent Labs  Lab 02/15/2019 1416 02/24/2019 1445 02/15/2019 1642  PROCALCITON >150.00  --   --   LATICACIDVEN  --  1.6 2.7*    Recent Results (from the past 240 hour(s))  Blood Culture (routine x 2)     Status: None (Preliminary result)   Collection Time: 03/10/2019  2:45 PM   Specimen: Right Antecubital; Blood  Result Value Ref Range Status   Specimen Description   Final    RIGHT ANTECUBITAL BOTTLES DRAWN AEROBIC AND ANAEROBIC   Special Requests Blood Culture adequate volume  Final   Culture   Final    NO GROWTH 2 DAYS Performed at [facility], [address] [place], [place] [address]    Report Status PENDING  Incomplete  Blood Culture (routine x 2)     Status: None (Preliminary result)   Collection Time: 03/16/2019  2:59 PM   Specimen: BLOOD RIGHT WRIST  Result Value Ref Range Status   Specimen Description   Final    BLOOD RIGHT WRIST BOTTLES DRAWN AEROBIC AND ANAEROBIC   Special Requests Blood Culture adequate volume  Final   Culture   Final    NO GROWTH 2 DAYS Performed at [facility], [address], [place], [place] [address]    Report Status PENDING  Incomplete         Radiology Studies: DG Chest Port 1 View  Result Date: 03/10/2019 CLINICAL DATA:  85 year old male with progressive shortness of breath for the past 2 days. Diagnosed with COVID 1 week ago. EXAM:  PORTABLE CHEST 1 VIEW COMPARISON:  Prior chest x-ray 03/04/2019 FINDINGS: Developing multifocal interstitial and airspace opacities most confluent in the periphery of the left lower lung. Changes are superimposed on a background of emphysema and chronic bronchitic changes. Cardiac and mediastinal contours are within normal limits. No acute osseous abnormality. IMPRESSION: 1. Developing bilateral patchy airspace opacities worse on the left than the right. Findings are concerning for progression 2 multifocal COVID pneumonia. 2. Background emphysema and chronic bronchitic changes. Electronically Signed   By: Jacqulynn Cadet M.D.   On: 03/06/2019 14:42   US Abdomen Limited RUQ  Result Date: 03/12/2019 CLINICAL DATA:  85 year old male with elevated LFTs EXAM: ULTRASOUND ABDOMEN LIMITED RIGHT UPPER QUADRANT COMPARISON:  None. FINDINGS: Gallbladder: No gallstones or wall thickening visualized. No sonographic Murphy sign noted by sonographer. Common bile duct: Diameter: Normal for age at 6 mm Liver: No focal lesion identified. Within normal limits in parenchymal echogenicity. Portal vein is patent on color Doppler imaging with normal direction of blood flow towards the liver. Other: Incidental imaging of the right kidney demonstrates increased parenchymal echogenicity. IMPRESSION: 1. Normal sonographic appearance of the liver. 2. Increased parenchymal echogenicity of the right kidney suggesting underlying medical renal disease. Electronically Signed   By: Jacqulynn Cadet M.D.   On: 03/12/2019 09:16        Scheduled Meds: . albuterol  2 puff Inhalation Q6H  . amLODipine  5 mg Oral Daily  . vitamin C  500 mg Oral Daily  . aspirin EC  81 mg Oral Daily  . atorvastatin  40 mg Oral Daily  . enoxaparin (LOVENOX) injection  30 mg Subcutaneous Q24H  . folic acid  1 mg Oral Daily  . glipiZIDE  2.5 mg Oral Daily  . guaiFENesin  600 mg Oral BID  . insulin aspart  0-5 Units Subcutaneous QHS  . insulin aspart   0-9 Units Subcutaneous TID WC  . insulin aspart  3 Units Subcutaneous TID WC  . methylPREDNISolone (SOLU-MEDROL) injection  0.5 mg/kg Intravenous Q12H  . metoprolol succinate  50 mg Oral Daily  . multivitamin with minerals  1 tablet Oral Daily  . sodium chloride flush  3 mL Intravenous Q12H  . terazosin  5 mg Oral QHS  . thiamine  100 mg Oral Daily  . zinc sulfate  220 mg Oral Daily   Continuous Infusions: . sodium chloride 250 mL (03/12/19 2046)  . azithromycin 500 mg (03/12/19 2157)  . cefTRIAXone (ROCEPHIN)  IV 1 g (03/12/19 2049)  . remdesivir 100 mg in NS 100 mL 100 mg (03/13/19 0854)     LOS: 2 days    Time spent: 35 minutes    Barb Merino, MD [facility] Pager [phone]

## 2019-03-14 LAB — CBC WITH DIFFERENTIAL/PLATELET
Abs Immature Granulocytes: 0.07 10*3/uL (ref 0.00–0.07)
Basophils Absolute: 0 10*3/uL (ref 0.0–0.1)
Basophils Relative: 0 %
Eosinophils Absolute: 0 10*3/uL (ref 0.0–0.5)
Eosinophils Relative: 0 %
HCT: 26.7 % — ABNORMAL LOW (ref 39.0–52.0)
Hemoglobin: 8.9 g/dL — ABNORMAL LOW (ref 13.0–17.0)
Immature Granulocytes: 1 %
Lymphocytes Relative: 8 %
Lymphs Abs: 0.5 10*3/uL — ABNORMAL LOW (ref 0.7–4.0)
MCH: 29.3 pg (ref 26.0–34.0)
MCHC: 33.3 g/dL (ref 30.0–36.0)
MCV: 87.8 fL (ref 80.0–100.0)
Monocytes Absolute: 0.5 10*3/uL (ref 0.1–1.0)
Monocytes Relative: 9 %
Neutro Abs: 4.5 10*3/uL (ref 1.7–7.7)
Neutrophils Relative %: 82 %
Platelets: 369 10*3/uL (ref 150–400)
RBC: 3.04 MIL/uL — ABNORMAL LOW (ref 4.22–5.81)
RDW: 15.1 % (ref 11.5–15.5)
WBC: 5.6 10*3/uL (ref 4.0–10.5)
nRBC: 0.5 % — ABNORMAL HIGH (ref 0.0–0.2)

## 2019-03-14 LAB — MAGNESIUM: Magnesium: 2.8 mg/dL — ABNORMAL HIGH (ref 1.7–2.4)

## 2019-03-14 LAB — COMPREHENSIVE METABOLIC PANEL
ALT: 80 U/L — ABNORMAL HIGH (ref 0–44)
AST: 39 U/L (ref 15–41)
Albumin: 2.5 g/dL — ABNORMAL LOW (ref 3.5–5.0)
Alkaline Phosphatase: 60 U/L (ref 38–126)
Anion gap: 9 (ref 5–15)
BUN: 86 mg/dL — ABNORMAL HIGH (ref 8–23)
CO2: 17 mmol/L — ABNORMAL LOW (ref 22–32)
Calcium: 7.9 mg/dL — ABNORMAL LOW (ref 8.9–10.3)
Chloride: 109 mmol/L (ref 98–111)
Creatinine, Ser: 2.66 mg/dL — ABNORMAL HIGH (ref 0.61–1.24)
GFR calc Af Amer: 24 mL/min — ABNORMAL LOW (ref >60)
GFR calc non Af Amer: 21 mL/min — ABNORMAL LOW (ref >60)
Glucose, Bld: 171 mg/dL — ABNORMAL HIGH (ref 70–99)
Potassium: 4.9 mmol/L (ref 3.5–5.1)
Sodium: 135 mmol/L (ref 135–145)
Total Bilirubin: 0.6 mg/dL (ref 0.3–1.2)
Total Protein: 6 g/dL — ABNORMAL LOW (ref 6.5–8.1)

## 2019-03-14 LAB — GLUCOSE, CAPILLARY
Glucose-Capillary: 175 mg/dL — ABNORMAL HIGH (ref 70–99)
Glucose-Capillary: 166 mg/dL — ABNORMAL HIGH (ref 70–99)
Glucose-Capillary: 239 mg/dL — ABNORMAL HIGH (ref 70–99)
Glucose-Capillary: 140 mg/dL — ABNORMAL HIGH (ref 70–99)

## 2019-03-14 LAB — D-DIMER, QUANTITATIVE: D-Dimer, Quant: 1.49 ug/mL-FEU — ABNORMAL HIGH (ref 0.00–0.50)

## 2019-03-14 LAB — C-REACTIVE PROTEIN: CRP: 9.3 mg/dL — ABNORMAL HIGH (ref <1.0)

## 2019-03-14 LAB — FERRITIN: Ferritin: 598 ng/mL — ABNORMAL HIGH (ref 24–336)

## 2019-03-14 LAB — PHOSPHORUS: Phosphorus: 3.3 mg/dL (ref 2.5–4.6)

## 2019-03-14 NOTE — Plan of Care (Signed)
  Problem: Education: Goal: Knowledge of General Education information will improve Description: Including pain rating scale, medication(s)/side effects and non-pharmacologic comfort measures Outcome: Progressing   Problem: Health Behavior/Discharge Planning: Goal: Ability to manage health-related needs will improve Outcome: Progressing   Problem: Clinical Measurements: Goal: Ability to maintain clinical measurements within normal limits will improve Outcome: Progressing Goal: Will remain free from infection Outcome: Progressing Goal: Diagnostic test results will improve Outcome: Progressing Goal: Respiratory complications will improve Outcome: Progressing Goal: Cardiovascular complication will be avoided Outcome: Progressing   Problem: Safety: Goal: Ability to remain free from injury will improve Outcome: Progressing   Problem: Respiratory: Goal: Will maintain a patent airway Outcome: Progressing Goal: Complications related to the disease process, condition or treatment will be avoided or minimized Outcome: Progressing

## 2019-03-14 NOTE — Progress Notes (Signed)
PROGRESS NOTE    Nicholas Swanson  VOZ:[id number] DOB: [birth year] DOA: 02/18/2019 PCP: Iona Beard, MD    Brief Narrative:  85 year old gentleman former smoker, history of COPD, type 2 diabetes, hypertension, hyperlipidemia, stage III CKD was diagnosed with COVID-19 on 03/04/2019, returned to hospital with shortness of breath, cough and fatigue with generalized weakness.  Patient is poor historian.  Also had nausea and vomiting.  He was very lethargic at home as per patient's wife. In the emergency room 92% on room air, chest x-ray shows bilateral pneumonia left more than right.  Creatinine was 3.38.  Potassium 5.4.  Procalcitonin more than 150.  Lactic acid 2.7.  Started on steroid and remdesivir and admitted to [facility].   Assessment & Plan:   Principal Problem:   Pneumonia due to COVID-19 virus Active Problems:   Essential hypertension   Acute respiratory disease due to COVID-19 virus   Type 2 diabetes mellitus with stage 3 chronic kidney disease (HCC)   Elevated LFTs   AKI (acute kidney injury) on CKD III  Pneumonia due to COVID-19 virus: Continue to monitor due to significant symptoms on presentation.  Oxygenation improved and mostly remains on room air. chest physiotherapy, incentive spirometry, deep breathing exercises, sputum induction, mucolytic's and bronchodilators.  Mobilize today with PT OT. Supplemental oxygen to keep saturations more than 90%. Covid directed therapy with , steroids, Solu-Medrol remdesivir, day 3/5 antibiotics, on Rocephin and azithromycin due to very high procalcitonin.  We will continue until final cultures available or clinical improvement. Due to severity of symptoms, patient will need daily inflammatory markers,  liver function test to monitor and direct COVID-19 therapies.  Acute on chronic renal failure: Stage IIIa at baseline. Treated with gentle IV fluids with improvement.  Potassium normalized. Creatinine is trending down.  This  is probably his baseline.  Continue monitoring.  Hypertension: Blood pressure stable.  On amlodipine.  Holding valsartan and hydrochlorothiazide due to AKI.  Type 2 diabetes with hyperglycemia: Aggravated by steroid use. continue glipizide and cover with sliding scale.  Elevated LFTs: Normal liver ultrasound.  Hepatitis panel negative.  Probably due to acute COVID-19 infection.  Trending down.  We will continue monitoring.  DVT prophylaxis: Lovenox subcu Code Status: Full code Family Communication: Wife [name] called and updated 12/28. Disposition Plan: Pending clinical improvement.  Work with PT OT today.    Consultants:   None  Procedures:   None  Antimicrobials:  Anti-infectives (From admission, onward)   Start     Dose/Rate Route Frequency Ordered Stop   03/12/19 1000  remdesivir 100 mg in sodium chloride 0.9 % 100 mL IVPB     100 mg 200 mL/hr over 30 Minutes Intravenous Daily 02/19/2019 1855 03/16/19 0959   03/05/2019 2100  cefTRIAXone (ROCEPHIN) 1 g in sodium chloride 0.9 % 100 mL IVPB     1 g 200 mL/hr over 30 Minutes Intravenous Every 24 hours 03/09/2019 2057     02/26/2019 2100  azithromycin (ZITHROMAX) 500 mg in sodium chloride 0.9 % 250 mL IVPB     500 mg 250 mL/hr over 60 Minutes Intravenous Every 24 hours 03/15/2019 2057     02/24/2019 1900  remdesivir 200 mg in sodium chloride 0.9% 250 mL IVPB     200 mg 580 mL/hr over 30 Minutes Intravenous Once 02/24/2019 1855 03/08/2019 2015         Subjective: Patient seen and examined.  No overnight events.  Poor historian, denies any complaints.  Remains afebrile.  Mostly  on room air.  Objective: Vitals:   03/13/19 2000 03/14/19 0430 03/14/19 0459 03/14/19 0829  BP: 138/64 (!) 148/67  (!) 149/58  Pulse: 78 80  80  Resp: 18 18  20   Temp: 98.5 F (36.9 C) 97.9 F (36.6 C)  97.9 F (36.6 C)  TempSrc: Oral Oral  Oral  SpO2: 98% 96%  97%  Weight:   77.1 kg   Height:        Intake/Output Summary (Last 24 hours) at  03/14/2019 1419 Last data filed at 03/14/2019 1221 Gross per 24 hour  Intake 690 ml  Output 1675 ml  Net -985 ml   Filed Weights   03/08/2019 1402 03/13/19 0500 03/14/19 0459  Weight: 83 kg 82.4 kg 77.1 kg    Examination:  General exam: Appears calm and comfortable, on room air. Respiratory system: Clear to auscultation. Respiratory effort normal.  No added sounds. Cardiovascular system: S1 & S2 heard, RRR. No JVD, murmurs, rubs, gallops or clicks. No pedal edema. Gastrointestinal system: Abdomen is nondistended, soft and nontender. No organomegaly or masses felt. Normal bowel sounds heard. Central nervous system: Alert and oriented x2.  No focal neurological deficits. Extremities: Symmetric 5 x 5 power. Skin: No rashes, lesions or ulcers Psychiatry: Judgement and insight appear normal. Mood & affect appropriate.     Data Reviewed: I have personally reviewed following labs and imaging studies  CBC: Recent Labs  Lab 03/01/2019 1416 03/12/19 0533 03/13/19 0420 03/14/19 0142  WBC 7.3 6.1 5.8 5.6  NEUTROABS 5.9 5.2 4.6 4.5  HGB 10.9* 10.2* 8.9* 8.9*  HCT 33.6* 31.9* 27.2* 26.7*  MCV 91.3 91.4 89.8 87.8  PLT 347 338 340 562   Basic Metabolic Panel: Recent Labs  Lab 03/10/2019 1416 03/12/19 0533 03/13/19 0420 03/14/19 0142  NA 136 137 137 135  K 5.4* 5.5* 5.5* 4.9  CL 105 109 106 109  CO2 17* 14* 15* 17*  GLUCOSE 138* 154* 127* 171*  BUN 62* 65* 84* 86*  CREATININE 3.38* 2.87* 2.94* 2.66*  CALCIUM 8.0* 7.8* 8.0* 7.9*  MG  --  2.5* 2.7* 2.8*  PHOS  --  4.4 4.5 3.3   GFR: Estimated Creatinine Clearance: 21.7 mL/min (A) (by C-G formula based on SCr of 2.66 mg/dL (H)). Liver Function Tests: Recent Labs  Lab 03/13/2019 1416 03/12/19 0533 03/13/19 0420 03/14/19 0142  AST 261* 121* 60* 39  ALT 220* 154* 99* 80*  ALKPHOS 86 72 63 60  BILITOT 1.0 0.8 0.7 0.6  PROT 7.5 6.8 6.2* 6.0*  ALBUMIN 2.9* 2.7* 2.5* 2.5*   No results for input(s): LIPASE, AMYLASE in the last  168 hours. No results for input(s): AMMONIA in the last 168 hours. Coagulation Profile: No results for input(s): INR, PROTIME in the last 168 hours. Cardiac Enzymes: No results for input(s): CKTOTAL, CKMB, CKMBINDEX, TROPONINI in the last 168 hours. BNP (last 3 results) No results for input(s): PROBNP in the last 8760 hours. HbA1C: Recent Labs    03/12/19 0533  HGBA1C 6.4*   CBG: Recent Labs  Lab 03/13/19 1504 03/13/19 1648 03/13/19 2046 03/14/19 0814 03/14/19 1138  GLUCAP 215* 198* 183* 175* 166*   Lipid Profile: No results for input(s): CHOL, HDL, LDLCALC, TRIG, CHOLHDL, LDLDIRECT in the last 72 hours. Thyroid Function Tests: No results for input(s): TSH, T4TOTAL, FREET4, T3FREE, THYROIDAB in the last 72 hours. Anemia Panel: Recent Labs    03/13/19 0420 03/14/19 0142  FERRITIN 957* 598*   Sepsis Labs: Recent Labs  Lab  03/15/2019 1416 03/10/2019 1445 03/16/2019 1642  PROCALCITON >150.00  --   --   LATICACIDVEN  --  1.6 2.7*    Recent Results (from the past 240 hour(s))  Blood Culture (routine x 2)     Status: None (Preliminary result)   Collection Time: 03/07/2019  2:45 PM   Specimen: Right Antecubital; Blood  Result Value Ref Range Status   Specimen Description   Final    RIGHT ANTECUBITAL BOTTLES DRAWN AEROBIC AND ANAEROBIC   Special Requests Blood Culture adequate volume  Final   Culture   Final    NO GROWTH 3 DAYS Performed at [facility], [address], [place], [place] [address]    Report Status PENDING  Incomplete  Blood Culture (routine x 2)     Status: None (Preliminary result)   Collection Time: 03/09/2019  2:59 PM   Specimen: BLOOD RIGHT WRIST  Result Value Ref Range Status   Specimen Description   Final    BLOOD RIGHT WRIST BOTTLES DRAWN AEROBIC AND ANAEROBIC   Special Requests Blood Culture adequate volume  Final   Culture   Final    NO GROWTH 3 DAYS Performed at [facility], [address], [place], [place] [address]    Report Status PENDING   Incomplete         Radiology Studies: No results found.      Scheduled Meds: . albuterol  2 puff Inhalation Q6H  . amLODipine  5 mg Oral Daily  . vitamin C  500 mg Oral Daily  . aspirin EC  81 mg Oral Daily  . atorvastatin  40 mg Oral Daily  . enoxaparin (LOVENOX) injection  30 mg Subcutaneous Q24H  . folic acid  1 mg Oral Daily  . glipiZIDE  2.5 mg Oral Daily  . guaiFENesin  600 mg Oral BID  . insulin aspart  0-5 Units Subcutaneous QHS  . insulin aspart  0-9 Units Subcutaneous TID WC  . insulin aspart  3 Units Subcutaneous TID WC  . methylPREDNISolone (SOLU-MEDROL) injection  0.5 mg/kg Intravenous Q12H  . metoprolol succinate  50 mg Oral Daily  . multivitamin with minerals  1 tablet Oral Daily  . sodium chloride flush  3 mL Intravenous Q12H  . terazosin  5 mg Oral QHS  . thiamine  100 mg Oral Daily  . zinc sulfate  220 mg Oral Daily   Continuous Infusions: . sodium chloride 250 mL (03/12/19 2046)  . azithromycin Stopped (03/13/19 2330)  . cefTRIAXone (ROCEPHIN)  IV Stopped (03/13/19 2329)  . remdesivir 100 mg in NS 100 mL 100 mg (03/14/19 0822)     LOS: 3 days    Time spent: 30 minutes    Barb Merino, MD [facility] Pager [phone]

## 2019-03-14 NOTE — Evaluation (Signed)
Physical Therapy Evaluation Patient Details Name: Nicholas Swanson MRN: [id number] DOB: October 05, 1930 Today's Date: 03/14/2019   History of Present Illness  85 y.o. male reformed smoker with past medical history relevant for COPD, DM2., HTN, HLD and CKD III who was diagnosed with COVID-19 infection on 03/04/2019 returned to the ED 03/16/2019 with shortness of breath, N/V, and weakness; acute on chronic renal failure  Clinical Impression   Pt admitted with above diagnosis. Patient previously independent without device. Normally he cares for his wife (she uses a wheelchair, but is very independent with mobility--assist for ADLs). He states his daughter (on disability) is staying with wife during the day and son/grandson live with pt/wife are home at night. He feels daughter will continue to come and stay when he returns home. He does not like the idea of needing to use a rolling walker, but is willing to learn if needed. (Today required min assist with hand-held assist with multiple losses of balance).  Pt currently with functional limitations due to the deficits listed below (see PT Problem List). Pt will benefit from skilled PT to increase their independence and safety with mobility to allow discharge to the venue listed below.       Follow Up Recommendations Home health PT;Supervision/Assistance - 24 hour    Equipment Recommendations  Rolling walker with 5" wheels(possibly; may progress and not need it)    Recommendations for Other Services OT consult     Precautions / Restrictions Precautions Precautions: Fall Restrictions Weight Bearing Restrictions: No      Mobility  Bed Mobility               General bed mobility comments: pt OOB in recliner  Transfers Overall transfer level: Needs assistance Equipment used: 1 person hand held assist Transfers: Sit to/from Stand Sit to Stand: Min assist         General transfer comment: steadying assist as come to  stand  Ambulation/Gait Ambulation/Gait assistance: Min assist Gait Distance (Feet): 16 Feet(seated rest; 24) Assistive device: 1 person hand held assist(opposite hand at times using foot board of his bed) Gait Pattern/deviations: Step-through pattern;Narrow base of support;Decreased stride length Gait velocity: very slow   General Gait Details: unsteady with LOB and min assist to recover at least 4 times; vc for widening step width  Stairs            Wheelchair Mobility    Modified Rankin (Stroke Patients Only)       Balance Overall balance assessment: Needs assistance Sitting-balance support: No upper extremity supported;Feet supported Sitting balance-Leahy Scale: Good Sitting balance - Comments: seated able to reach down to put one sock on, then fatigued with SOB and asked for help with second sock   Standing balance support: Single extremity supported Standing balance-Leahy Scale: Poor                               Pertinent Vitals/Pain Pain Assessment: No/denies pain    Home Living Family/patient expects to be discharged to:: Private residence Living Arrangements: Spouse/significant other;Children;Other relatives(son and grandson (both work days)) Available Help at Discharge: Family Type of Home: Mobile home Home Access: Ramped entrance     Home Layout: One level Home Equipment: [place] - single point Additional Comments: pt's wife uses a wheelchair; he reports she can transfer in/out of wheelchair independently using "the bathroom in our bedroom (?[facility])" he does all the cooking; his daughter comes and helps clean;  pt only has a cane but wife has other DME (not listed)    Prior Function Level of Independence: Independent         Comments: he helps care for his wife (she uses a wheelchair); son, daughter and grandson are staying with wife while he is in the hospital     Hand Dominance        Extremity/Trunk Assessment   Upper Extremity  Assessment Upper Extremity Assessment: Defer to OT evaluation    Lower Extremity Assessment Lower Extremity Assessment: Overall WFL for tasks assessed    Cervical / Trunk Assessment Cervical / Trunk Assessment: Normal  Communication   Communication: No difficulties  Cognition Arousal/Alertness: Awake/alert Behavior During Therapy: WFL for tasks assessed/performed Overall Cognitive Status: Within Functional Limits for tasks assessed                                        General Comments General comments (skin integrity, edema, etc.): Patient with mild dyspnea with activity, but recovers quickly once seated.     Exercises     Assessment/Plan    PT Assessment Patient needs continued PT services  PT Problem List Decreased strength;Decreased activity tolerance;Decreased balance;Decreased mobility;Decreased knowledge of use of DME;Cardiopulmonary status limiting activity       PT Treatment Interventions DME instruction;Gait training;Functional mobility training;Therapeutic activities;Therapeutic exercise;Balance training;Patient/family education    PT Goals (Current goals can be found in the Care Plan section)  Acute Rehab PT Goals Patient Stated Goal: get stronger and go home PT Goal Formulation: With patient Time For Goal Achievement: 03/28/19 Potential to Achieve Goals: Good    Frequency Min 3X/week   Barriers to discharge        Co-evaluation               AM-PAC PT "6 Clicks" Mobility  Outcome Measure Help needed turning from your back to your side while in a flat bed without using bedrails?: None Help needed moving from lying on your back to sitting on the side of a flat bed without using bedrails?: A Little Help needed moving to and from a bed to a chair (including a wheelchair)?: A Little Help needed standing up from a chair using your arms (e.g., wheelchair or bedside chair)?: A Little Help needed to walk in hospital room?: A  Little Help needed climbing 3-5 steps with a railing? : A Lot 6 Click Score: 18    End of Session   Activity Tolerance: Patient limited by fatigue Patient left: in chair;with call bell/phone within reach Nurse Communication: Mobility status PT Visit Diagnosis: Unsteadiness on feet (R26.81);Muscle weakness (generalized) (M62.81)    Time: 1440(error entering initially)-1504 PT Time Calculation (min) (ACUTE ONLY): 24 min   Charges:   PT Evaluation $PT Eval Low Complexity: 1 Low PT Treatments $Gait Training: 8-22 mins         Nicholas Swanson, PT Pager [phone]   Nicholas Swanson 03/14/2019, 5:12 PM

## 2019-03-14 NOTE — Plan of Care (Signed)
  Problem: Education: Goal: Knowledge of General Education information will improve Description: Including pain rating scale, medication(s)/side effects and non-pharmacologic comfort measures Outcome: Progressing   Problem: Clinical Measurements: Goal: Will remain free from infection Outcome: Progressing   Problem: Clinical Measurements: Goal: Respiratory complications will improve Outcome: Progressing   Problem: Clinical Measurements: Goal: Diagnostic test results will improve Outcome: Progressing   Problem: Activity: Goal: Risk for activity intolerance will decrease Outcome: Progressing   Problem: Nutrition: Goal: Adequate nutrition will be maintained Outcome: Progressing   Problem: Safety: Goal: Ability to remain free from injury will improve Outcome: Progressing

## 2019-03-15 LAB — GLUCOSE, CAPILLARY
Glucose-Capillary: 200 mg/dL — ABNORMAL HIGH (ref 70–99)
Glucose-Capillary: 171 mg/dL — ABNORMAL HIGH (ref 70–99)
Glucose-Capillary: 203 mg/dL — ABNORMAL HIGH (ref 70–99)
Glucose-Capillary: 218 mg/dL — ABNORMAL HIGH (ref 70–99)

## 2019-03-15 LAB — CBC WITH DIFFERENTIAL/PLATELET
Abs Immature Granulocytes: 0.05 10*3/uL (ref 0.00–0.07)
Basophils Absolute: 0 10*3/uL (ref 0.0–0.1)
Basophils Relative: 0 %
Eosinophils Absolute: 0 10*3/uL (ref 0.0–0.5)
Eosinophils Relative: 0 %
HCT: 28.5 % — ABNORMAL LOW (ref 39.0–52.0)
Hemoglobin: 9.4 g/dL — ABNORMAL LOW (ref 13.0–17.0)
Immature Granulocytes: 1 %
Lymphocytes Relative: 7 %
Lymphs Abs: 0.5 10*3/uL — ABNORMAL LOW (ref 0.7–4.0)
MCH: 29.2 pg (ref 26.0–34.0)
MCHC: 33 g/dL (ref 30.0–36.0)
MCV: 88.5 fL (ref 80.0–100.0)
Monocytes Absolute: 0.4 10*3/uL (ref 0.1–1.0)
Monocytes Relative: 6 %
Neutro Abs: 5.6 10*3/uL (ref 1.7–7.7)
Neutrophils Relative %: 86 %
Platelets: 418 10*3/uL — ABNORMAL HIGH (ref 150–400)
RBC: 3.22 MIL/uL — ABNORMAL LOW (ref 4.22–5.81)
RDW: 14.8 % (ref 11.5–15.5)
WBC: 6.5 10*3/uL (ref 4.0–10.5)
nRBC: 0 % (ref 0.0–0.2)

## 2019-03-15 LAB — FERRITIN: Ferritin: 354 ng/mL — ABNORMAL HIGH (ref 24–336)

## 2019-03-15 LAB — MAGNESIUM: Magnesium: 2.8 mg/dL — ABNORMAL HIGH (ref 1.7–2.4)

## 2019-03-15 LAB — BASIC METABOLIC PANEL
Anion gap: 9 (ref 5–15)
BUN: 77 mg/dL — ABNORMAL HIGH (ref 8–23)
CO2: 17 mmol/L — ABNORMAL LOW (ref 22–32)
Calcium: 8.4 mg/dL — ABNORMAL LOW (ref 8.9–10.3)
Chloride: 109 mmol/L (ref 98–111)
Creatinine, Ser: 2.62 mg/dL — ABNORMAL HIGH (ref 0.61–1.24)
GFR calc Af Amer: 25 mL/min — ABNORMAL LOW (ref >60)
GFR calc non Af Amer: 21 mL/min — ABNORMAL LOW (ref >60)
Glucose, Bld: 212 mg/dL — ABNORMAL HIGH (ref 70–99)
Potassium: 5.2 mmol/L — ABNORMAL HIGH (ref 3.5–5.1)
Sodium: 135 mmol/L (ref 135–145)

## 2019-03-15 LAB — D-DIMER, QUANTITATIVE: D-Dimer, Quant: 1.38 ug/mL-FEU — ABNORMAL HIGH (ref 0.00–0.50)

## 2019-03-15 LAB — COMPREHENSIVE METABOLIC PANEL
ALT: 69 U/L — ABNORMAL HIGH (ref 0–44)
AST: 30 U/L (ref 15–41)
Albumin: 2.5 g/dL — ABNORMAL LOW (ref 3.5–5.0)
Alkaline Phosphatase: 60 U/L (ref 38–126)
Anion gap: 10 (ref 5–15)
BUN: 84 mg/dL — ABNORMAL HIGH (ref 8–23)
CO2: 17 mmol/L — ABNORMAL LOW (ref 22–32)
Calcium: 8.1 mg/dL — ABNORMAL LOW (ref 8.9–10.3)
Chloride: 108 mmol/L (ref 98–111)
Creatinine, Ser: 2.49 mg/dL — ABNORMAL HIGH (ref 0.61–1.24)
GFR calc Af Amer: 26 mL/min — ABNORMAL LOW (ref >60)
GFR calc non Af Amer: 23 mL/min — ABNORMAL LOW (ref >60)
Glucose, Bld: 210 mg/dL — ABNORMAL HIGH (ref 70–99)
Potassium: 5.5 mmol/L — ABNORMAL HIGH (ref 3.5–5.1)
Sodium: 135 mmol/L (ref 135–145)
Total Bilirubin: 0.4 mg/dL (ref 0.3–1.2)
Total Protein: 5.9 g/dL — ABNORMAL LOW (ref 6.5–8.1)

## 2019-03-15 LAB — C-REACTIVE PROTEIN: CRP: 5.7 mg/dL — ABNORMAL HIGH (ref <1.0)

## 2019-03-15 LAB — PHOSPHORUS: Phosphorus: 3.3 mg/dL (ref 2.5–4.6)

## 2019-03-15 MED ORDER — DEXAMETHASONE SODIUM PHOSPHATE 10 MG/ML IJ SOLN
6.0000 mg | INTRAMUSCULAR | Status: AC
  Administered 2019-03-16 – 2019-03-20 (×5): 6 mg via INTRAVENOUS
  Filled 2019-03-15 (×5): qty 1

## 2019-03-15 MED ORDER — SODIUM ZIRCONIUM CYCLOSILICATE 10 G PO PACK
10.0000 g | PACK | Freq: Once | ORAL | Status: AC
  Administered 2019-03-15: 10 g via ORAL
  Filled 2019-03-15: qty 1

## 2019-03-15 NOTE — Progress Notes (Signed)
Pt stable with no signs of distress. Report given to oncoming nurse. Safety maintained.  

## 2019-03-15 NOTE — Plan of Care (Signed)

## 2019-03-15 NOTE — Progress Notes (Signed)
Occupational Therapy Evaluation Patient Details Name: Nicholas Swanson MRN: [id number] DOB: 01/05/1931 Today's Date: 03/15/2019    History of Present Illness 85 y.o. male reformed smoker with past medical history relevant for COPD, DM2., HTN, HLD and CKD III who was diagnosed with COVID-19 infection on 03/04/2019 returned to the ED 03/14/2019 with shortness of breath, N/V, and weakness; acute on chronic renal failure   Clinical Impression   PTA pt lived with his wife, independent in all ADL, IADL, and mobility tasks. Pt does not ambulate with an assistive device and reports 0 falls in the last 6 months. Pt helps take care of his wife who is in a wheelchair. Pt currently independent to min assist for self-care and functional transfer tasks. Pt able to ambulate to/from bathroom with RW and min assist for balance. Pt completed toileting task requiring min assist for transfer due to low seat height. Pt tolerated standing 1 x 2 min at the sink to complete grooming/hygiene tasks. SpO2 decreased to 86% on room air during activity with pt requiring 2 min seated rest break to increase SpO2 back to 90s. 2/4 DOE. Pt demonstrates decreased strength, endurance, balance, standing tolerance, and activity tolerance impacting ability to complete self-care and functional transfer tasks. Recommend skilled OT services to address above deficits in order to promote function and prevent further decline. Recommend [place] OT for additional rehab following discharge home.    Follow Up Recommendations  Home health OT;Supervision/Assistance - 24 hour    Equipment Recommendations  None recommended by OT    Recommendations for Other Services       Precautions / Restrictions Precautions Precautions: Fall Restrictions Weight Bearing Restrictions: No      Mobility Bed Mobility Overal bed mobility: Needs Assistance Bed Mobility: Supine to Sit     Supine to sit: Supervision;HOB elevated     General bed mobility comments:  Use of bedrail  Transfers Overall transfer level: Needs assistance Equipment used: Rolling walker (2 wheeled) Transfers: Sit to/from Stand Sit to Stand: Min assist         General transfer comment: Min assist from low seat height    Balance Overall balance assessment: Needs assistance   Sitting balance-Leahy Scale: Good       Standing balance-Leahy Scale: Poor                             ADL either performed or assessed with clinical judgement   ADL Overall ADL's : Needs assistance/impaired Eating/Feeding: Independent;Sitting   Grooming: Min guard;Standing   Upper Body Bathing: Set up;Supervision/ safety;Sitting   Lower Body Bathing: Set up;Supervison/ safety;Min guard;Sit to/from stand   Upper Body Dressing : Set up;Supervision/safety;Sitting   Lower Body Dressing: Set up;Supervision/safety;Min guard;Sit to/from stand   Toilet Transfer: Ambulation;Regular Toilet;Grab bars;Minimal assistance   Toileting- Water quality scientist and Hygiene: Min guard;Minimal assistance;Sit to/from stand       Functional mobility during ADLs: Minimal assistance;Rolling walker General ADL Comments: Pt able to ambulate to/from bathroom with RW and min assist. Noted 0 instances of LOB, however pt unsteady on his feet.     Vision         Perception     Praxis      Pertinent Vitals/Pain Pain Assessment: No/denies pain     Hand Dominance Right   Extremity/Trunk Assessment Upper Extremity Assessment Upper Extremity Assessment: Generalized weakness   Lower Extremity Assessment Lower Extremity Assessment: Defer to PT evaluation  Communication Communication Communication: No difficulties   Cognition Arousal/Alertness: Awake/alert Behavior During Therapy: WFL for tasks assessed/performed Overall Cognitive Status: Within Functional Limits for tasks assessed                                     General Comments  Pt able to ambulate to/from  bathroom with RW and min assist. Noted 0 instances of LOB, however pt unsteady on feet. SpO2 decreased to 86% during activity with pt requiring 2 min seated recovery to return back to 90s.     Exercises     Shoulder Instructions      Home Living Family/patient expects to be discharged to:: Private residence Living Arrangements: Spouse/significant other;Children(son and grandson (both work days)) Available Help at Discharge: Family Type of Home: Mobile home Home Access: Ramped entrance     Mesic: One level     Bathroom Shower/Tub: Occupational psychologist: [place]: Shower seat;Cane - single point          Prior Functioning/Environment Level of Independence: Independent        Comments: Pt independent in ADLs, IADLs, and mobility. Pt still drives. Pt reports 0 falls in the last 6 months. Pt helps take care of his wife who is in a wheelchair.         OT Problem List: Decreased strength;Decreased activity tolerance;Impaired balance (sitting and/or standing);Decreased knowledge of use of DME or AE;Cardiopulmonary status limiting activity      OT Treatment/Interventions: Self-care/ADL training;Therapeutic exercise;Neuromuscular education;Energy conservation;DME and/or AE instruction;Therapeutic activities;Patient/family education;Balance training    OT Goals(Current goals can be found in the care plan section) Acute Rehab OT Goals Patient Stated Goal: To go home Time For Goal Achievement: 03/29/19 Potential to Achieve Goals: Good ADL Goals Pt Will Perform Grooming: with modified independence;standing Pt Will Perform Lower Body Bathing: with modified independence;sit to/from stand Pt Will Perform Lower Body Dressing: with modified independence;sit to/from stand Pt Will Transfer to Toilet: with modified independence;ambulating Pt Will Perform Toileting - Clothing Manipulation and hygiene: with modified independence;sit to/from  stand Additional ADL Goal #1: Pt to tolerate standing up to 5 min with modified independence and SpO2 maintaining above 90%, in preparation for ADLs. Additional ADL Goal #2: Pt to recall and verbalize 3 fall prevention strategies with 0 verbal cues.  OT Frequency: Min 3X/week   Barriers to D/C:            Co-evaluation              AM-PAC OT "6 Clicks" Daily Activity     Outcome Measure Help from another person eating meals?: None Help from another person taking care of personal grooming?: A Little Help from another person toileting, which includes using toliet, bedpan, or urinal?: A Little Help from another person bathing (including washing, rinsing, drying)?: A Little Help from another person to put on and taking off regular upper body clothing?: A Little Help from another person to put on and taking off regular lower body clothing?: A Little 6 Click Score: 19   End of Session Equipment Utilized During Treatment: Rolling walker Nurse Communication: Mobility status  Activity Tolerance: Patient limited by fatigue(Limited by SOB) Patient left: in chair;with call bell/phone within reach  OT Visit Diagnosis: Unsteadiness on feet (R26.81);Muscle weakness (generalized) (M62.81)  Time: 0018-0970 OT Time Calculation (min): 28 min Charges:  OT General Charges $OT Visit: 1 Visit OT Evaluation $OT Eval Low Complexity: 1 Low OT Treatments $Self Care/Home Management : 8-22 mins  Mauri Brooklyn OTR/L [phone]   Mauri Brooklyn 03/15/2019, 4:55 PM

## 2019-03-15 NOTE — Progress Notes (Addendum)
PROGRESS NOTE    Nicholas Swanson  EFE:[id number] DOB: 23-Jun-1930 DOA: 02/22/2019 PCP: Iona Beard, MD  Brief Narrative: 85 year old gentleman former smoker, history of COPD, type 2 diabetes, hypertension, hyperlipidemia, stage III CKD was diagnosed with COVID-19 on 03/04/2019, returned to hospital with shortness of breath, cough and fatigue with generalized weakness.  Patient is poor historian.  Also had nausea and vomiting.  He was very lethargic at home as per patient's wife. In the emergency room 92% on room air, chest x-ray shows bilateral pneumonia left more than right.  Creatinine was 3.38.  Potassium 5.4.  Procalcitonin more than 150.  Lactic acid 2.7.  Started on steroid and remdesivir and admitted to [facility].  Assessment & Plan:   Principal Problem:   Pneumonia due to COVID-19 virus Active Problems:   Essential hypertension   Acute respiratory disease due to COVID-19 virus   Type 2 diabetes mellitus with stage 3 chronic kidney disease (HCC)   Elevated LFTs   AKI (acute kidney injury) on CKD III   Pneumonia due to COVID-19 virus: Continue to monitor due to significant symptoms on presentation.  Oxygenation improved and mostly remains on room air. Continue steroids, remdesivir completed today On abx for CAP, procalcitonin >150 on presentation - repeat I/O, daily weights IS, OOB, prone as able Daily inflammatory labs Blood cx NGTD x 4  COVID-19 Labs  Recent Labs    03/13/19 0420 03/14/19 0142 03/15/19 0430  DDIMER 1.55* 1.49* 1.38*  FERRITIN 957* 598* 354*  CRP 15.8* 9.3* 5.7*    No results found for: SARSCOV2NAA  Acute on chronic renal failure: Stage IIIa at baseline. Creatinine peaked at 3.38 Baseline creatinine unknown Continue to monitor and follow  Hyperkalemia: lokelma and follow  Hypertension: Blood pressure stable.  On amlodipine.  Holding valsartan and hydrochlorothiazide due to AKI.  Type 2 diabetes with hyperglycemia: Aggravated  by steroid use. continue glipizide and cover with sliding scale.  Elevated LFTs: Normal liver ultrasound.  Hepatitis panel negative.  Probably due to acute COVID-19 infection.  Trending down.  We will continue monitoring.  DVT prophylaxis: lovenox Code Status: full  Family Communication: none at bedside - wife Disposition Plan: pending further improvemetn  Consultants:   none  Procedures:   none  Antimicrobials:  Anti-infectives (From admission, onward)   Start     Dose/Rate Route Frequency Ordered Stop   03/12/19 1000  remdesivir 100 mg in sodium chloride 0.9 % 100 mL IVPB     100 mg 200 mL/hr over 30 Minutes Intravenous Daily 03/08/2019 1855 03/15/19 0958   03/13/2019 2100  cefTRIAXone (ROCEPHIN) 1 g in sodium chloride 0.9 % 100 mL IVPB     1 g 200 mL/hr over 30 Minutes Intravenous Every 24 hours 02/26/2019 2057 03/16/19 2059   03/02/2019 2100  azithromycin (ZITHROMAX) 500 mg in sodium chloride 0.9 % 250 mL IVPB     500 mg 250 mL/hr over 60 Minutes Intravenous Every 24 hours 03/14/2019 2057 03/16/19 2059   03/03/2019 1900  remdesivir 200 mg in sodium chloride 0.9% 250 mL IVPB     200 mg 580 mL/hr over 30 Minutes Intravenous Once 02/25/2019 1855 02/28/2019 2015     Subjective: Talah [place]ston&Ox3 No new complaints Asking when he'll go home  Objective: Vitals:   03/14/19 1919 03/15/19 0347 03/15/19 0500 03/15/19 1631  BP: 127/61 (!) 149/57  (!) 119/59  Pulse: 78 68  75  Resp: 20 20  18   Temp: 98 F (36.7 C) 97.9 F (36.6  C)  97.9 F (36.6 C)  TempSrc: Oral Oral  Oral  SpO2: 98% 98%  97%  Weight:   77.6 kg   Height:        Intake/Output Summary (Last 24 hours) at 03/15/2019 1802 Last data filed at 03/15/2019 0936 Gross per 24 hour  Intake 338 ml  Output 1400 ml  Net -1062 ml   Filed Weights   03/13/19 0500 03/14/19 0459 03/15/19 0500  Weight: 82.4 kg 77.1 kg 77.6 kg    Examination:  General exam: Appears calm and comfortable  Respiratory system: Clear to auscultation.  Respiratory effort normal. Cardiovascular system: RRR Gastrointestinal system: Abdomen is nondistended, soft and nontender.  Central nervous system: Alert and oriented. No focal neurological deficits. Extremities: no LEE Skin: No rashes, lesions or ulcers Psychiatry: Judgement and insight appear normal. Mood & affect appropriate.     Data Reviewed: I have personally reviewed following labs and imaging studies  CBC: Recent Labs  Lab 03/02/2019 1416 03/12/19 0533 03/13/19 0420 03/14/19 0142 03/15/19 0430  WBC 7.3 6.1 5.8 5.6 6.5  NEUTROABS 5.9 5.2 4.6 4.5 5.6  HGB 10.9* 10.2* 8.9* 8.9* 9.4*  HCT 33.6* 31.9* 27.2* 26.7* 28.5*  MCV 91.3 91.4 89.8 87.8 88.5  PLT 347 338 340 369 767*   Basic Metabolic Panel: Recent Labs  Lab 03/12/19 0533 03/13/19 0420 03/14/19 0142 03/15/19 0430 03/15/19 1640  NA 137 137 135 135 135  K 5.5* 5.5* 4.9 5.5* 5.2*  CL 109 106 109 108 109  CO2 14* 15* 17* 17* 17*  GLUCOSE 154* 127* 171* 210* 212*  BUN 65* 84* 86* 84* 77*  CREATININE 2.87* 2.94* 2.66* 2.49* 2.62*  CALCIUM 7.8* 8.0* 7.9* 8.1* 8.4*  MG 2.5* 2.7* 2.8* 2.8*  --   PHOS 4.4 4.5 3.3 3.3  --    GFR: Estimated Creatinine Clearance: 22.2 mL/min (Dniyah Grant) (by C-G formula based on SCr of 2.62 mg/dL (H)). Liver Function Tests: Recent Labs  Lab 02/25/2019 1416 03/12/19 0533 03/13/19 0420 03/14/19 0142 03/15/19 0430  AST 261* 121* 60* 39 30  ALT 220* 154* 99* 80* 69*  ALKPHOS 86 72 63 60 60  BILITOT 1.0 0.8 0.7 0.6 0.4  PROT 7.5 6.8 6.2* 6.0* 5.9*  ALBUMIN 2.9* 2.7* 2.5* 2.5* 2.5*   No results for input(s): LIPASE, AMYLASE in the last 168 hours. No results for input(s): AMMONIA in the last 168 hours. Coagulation Profile: No results for input(s): INR, PROTIME in the last 168 hours. Cardiac Enzymes: No results for input(s): CKTOTAL, CKMB, CKMBINDEX, TROPONINI in the last 168 hours. BNP (last 3 results) No results for input(s): PROBNP in the last 8760 hours. HbA1C: No results for  input(s): HGBA1C in the last 72 hours. CBG: Recent Labs  Lab 03/14/19 1615 03/14/19 2046 03/15/19 0802 03/15/19 1228 03/15/19 1657  GLUCAP 239* 140* 200* 171* 203*   Lipid Profile: No results for input(s): CHOL, HDL, LDLCALC, TRIG, CHOLHDL, LDLDIRECT in the last 72 hours. Thyroid Function Tests: No results for input(s): TSH, T4TOTAL, FREET4, T3FREE, THYROIDAB in the last 72 hours. Anemia Panel: Recent Labs    03/14/19 0142 03/15/19 0430  FERRITIN 598* 354*   Sepsis Labs: Recent Labs  Lab 02/19/2019 1416 02/20/2019 1445 02/20/2019 1642  PROCALCITON >150.00  --   --   LATICACIDVEN  --  1.6 2.7*    Recent Results (from the past 240 hour(s))  Blood Culture (routine x 2)     Status: None (Preliminary result)   Collection Time: 03/09/2019  2:45 PM   Specimen: Right Antecubital; Blood  Result Value Ref Range Status   Specimen Description   Final    RIGHT ANTECUBITAL BOTTLES DRAWN AEROBIC AND ANAEROBIC   Special Requests Blood Culture adequate volume  Final   Culture   Final    NO GROWTH 4 DAYS Performed at [facility], [address] [place], [place] [address]    Report Status PENDING  Incomplete  Blood Culture (routine x 2)     Status: None (Preliminary result)   Collection Time: 03/02/2019  2:59 PM   Specimen: BLOOD RIGHT WRIST  Result Value Ref Range Status   Specimen Description   Final    BLOOD RIGHT WRIST BOTTLES DRAWN AEROBIC AND ANAEROBIC   Special Requests Blood Culture adequate volume  Final   Culture   Final    NO GROWTH 4 DAYS Performed at [facility], [address], [place],  [address]    Report Status PENDING  Incomplete         Radiology Studies: No results found.      Scheduled Meds: . albuterol  2 puff Inhalation Q6H  . amLODipine  5 mg Oral Daily  . vitamin C  500 mg Oral Daily  . aspirin EC  81 mg Oral Daily  . atorvastatin  40 mg Oral Daily  . enoxaparin (LOVENOX) injection  30 mg Subcutaneous Q24H  . folic acid  1 mg Oral Daily   . glipiZIDE  2.5 mg Oral Daily  . guaiFENesin  600 mg Oral BID  . insulin aspart  0-5 Units Subcutaneous QHS  . insulin aspart  0-9 Units Subcutaneous TID WC  . insulin aspart  3 Units Subcutaneous TID WC  . methylPREDNISolone (SOLU-MEDROL) injection  0.5 mg/kg Intravenous Q12H  . metoprolol succinate  50 mg Oral Daily  . multivitamin with minerals  1 tablet Oral Daily  . sodium chloride flush  3 mL Intravenous Q12H  . terazosin  5 mg Oral QHS  . thiamine  100 mg Oral Daily  . zinc sulfate  220 mg Oral Daily   Continuous Infusions: . sodium chloride 250 mL (03/12/19 2046)  . azithromycin Stopped (03/14/19 2318)  . cefTRIAXone (ROCEPHIN)  IV Stopped (03/14/19 2154)     LOS: 4 days    Time spent: over 30 min    Fayrene Helper, MD [facility] Pager AMION  If 7PM-7AM, please contact night-coverage [email or web address] Password [email or web address] 03/15/2019, 6:02 PM

## 2019-03-16 LAB — CBC WITH DIFFERENTIAL/PLATELET
Abs Immature Granulocytes: 0.05 10*3/uL (ref 0.00–0.07)
Basophils Absolute: 0 10*3/uL (ref 0.0–0.1)
Basophils Relative: 0 %
Eosinophils Absolute: 0 10*3/uL (ref 0.0–0.5)
Eosinophils Relative: 0 %
HCT: 27 % — ABNORMAL LOW (ref 39.0–52.0)
Hemoglobin: 8.9 g/dL — ABNORMAL LOW (ref 13.0–17.0)
Immature Granulocytes: 1 %
Lymphocytes Relative: 6 %
Lymphs Abs: 0.5 10*3/uL — ABNORMAL LOW (ref 0.7–4.0)
MCH: 29 pg (ref 26.0–34.0)
MCHC: 33 g/dL (ref 30.0–36.0)
MCV: 87.9 fL (ref 80.0–100.0)
Monocytes Absolute: 0.6 10*3/uL (ref 0.1–1.0)
Monocytes Relative: 8 %
Neutro Abs: 6.9 10*3/uL (ref 1.7–7.7)
Neutrophils Relative %: 85 %
Platelets: 450 10*3/uL — ABNORMAL HIGH (ref 150–400)
RBC: 3.07 MIL/uL — ABNORMAL LOW (ref 4.22–5.81)
RDW: 14.7 % (ref 11.5–15.5)
WBC: 8.1 10*3/uL (ref 4.0–10.5)
nRBC: 0 % (ref 0.0–0.2)

## 2019-03-16 LAB — D-DIMER, QUANTITATIVE: D-Dimer, Quant: 1.59 ug/mL-FEU — ABNORMAL HIGH (ref 0.00–0.50)

## 2019-03-16 LAB — COMPREHENSIVE METABOLIC PANEL
ALT: 58 U/L — ABNORMAL HIGH (ref 0–44)
AST: 26 U/L (ref 15–41)
Albumin: 2.6 g/dL — ABNORMAL LOW (ref 3.5–5.0)
Alkaline Phosphatase: 54 U/L (ref 38–126)
Anion gap: 10 (ref 5–15)
BUN: 78 mg/dL — ABNORMAL HIGH (ref 8–23)
CO2: 18 mmol/L — ABNORMAL LOW (ref 22–32)
Calcium: 8.2 mg/dL — ABNORMAL LOW (ref 8.9–10.3)
Chloride: 108 mmol/L (ref 98–111)
Creatinine, Ser: 2.45 mg/dL — ABNORMAL HIGH (ref 0.61–1.24)
GFR calc Af Amer: 27 mL/min — ABNORMAL LOW (ref >60)
GFR calc non Af Amer: 23 mL/min — ABNORMAL LOW (ref >60)
Glucose, Bld: 204 mg/dL — ABNORMAL HIGH (ref 70–99)
Potassium: 5.2 mmol/L — ABNORMAL HIGH (ref 3.5–5.1)
Sodium: 136 mmol/L (ref 135–145)
Total Bilirubin: 0.9 mg/dL (ref 0.3–1.2)
Total Protein: 5.7 g/dL — ABNORMAL LOW (ref 6.5–8.1)

## 2019-03-16 LAB — MAGNESIUM: Magnesium: 2.8 mg/dL — ABNORMAL HIGH (ref 1.7–2.4)

## 2019-03-16 LAB — PROCALCITONIN: Procalcitonin: <0.1 ng/mL

## 2019-03-16 LAB — CULTURE, BLOOD (ROUTINE X 2)
Culture: NO GROWTH
Culture: NO GROWTH
Special Requests: ADEQUATE
Special Requests: ADEQUATE

## 2019-03-16 LAB — GLUCOSE, CAPILLARY
Glucose-Capillary: 127 mg/dL — ABNORMAL HIGH (ref 70–99)
Glucose-Capillary: 189 mg/dL — ABNORMAL HIGH (ref 70–99)
Glucose-Capillary: 192 mg/dL — ABNORMAL HIGH (ref 70–99)
Glucose-Capillary: 202 mg/dL — ABNORMAL HIGH (ref 70–99)
Glucose-Capillary: 83 mg/dL (ref 70–99)

## 2019-03-16 LAB — FERRITIN: Ferritin: 289 ng/mL (ref 24–336)

## 2019-03-16 LAB — PHOSPHORUS: Phosphorus: 3.2 mg/dL (ref 2.5–4.6)

## 2019-03-16 LAB — C-REACTIVE PROTEIN: CRP: 3.7 mg/dL — ABNORMAL HIGH (ref <1.0)

## 2019-03-16 MED ORDER — SODIUM ZIRCONIUM CYCLOSILICATE 10 G PO PACK
10.0000 g | PACK | Freq: Once | ORAL | Status: AC
  Administered 2019-03-16: 10 g via ORAL
  Filled 2019-03-16: qty 1

## 2019-03-16 NOTE — Progress Notes (Signed)
Finger stick blood sugar 45 and then 58 on immediate recheck. Patient awake and alert. Gave 4oz of juice. Will recheck blood sugar according to protocol at 2130.

## 2019-03-16 NOTE — Progress Notes (Signed)
Physical Therapy Treatment Patient Details Name: Nicholas Swanson MRN: [id number] DOB: 1930-09-14 Today's Date: 03/16/2019    History of Present Illness 85 y.o. male reformed smoker with past medical history relevant for COPD, DM2., HTN, HLD and CKD III who was diagnosed with COVID-19 infection on 03/04/2019 returned to the ED 03/15/2019 with shortness of breath, N/V, and weakness; acute on chronic renal failure    PT Comments    Continues to be most hindered with fatigue/min/mod exertion. He is receptive to PT. Generalized weakness. Currently on RA. Demonstrates good seated stationary "breathing recovery". Was able to ambulate further today with RW, and was positioned in BS chair, chair alarm in reach, telephone and call light in reach. BS table in front of him with encouragement to eat his breakfast. Should continue to benefit from PT to progress toward goals as established by Primary PT.    Follow Up Recommendations  Home health PT;Supervision/Assistance - 24 hour     Equipment Recommendations       Recommendations for Other Services       Precautions / Restrictions Precautions Precautions: Fall Restrictions Weight Bearing Restrictions: No    Mobility  Bed Mobility Overal bed mobility: Needs Assistance Bed Mobility: Supine to Sit     Supine to sit: Min guard     General bed mobility comments: Use of bedrail  Transfers Overall transfer level: Needs assistance Equipment used: Rolling walker (2 wheeled) Transfers: Sit to/from Stand Sit to Stand: Min guard         General transfer comment: Min assist from low seat height- but Min Guard off bed level  Ambulation/Gait Ambulation/Gait assistance: Min Web designer (Feet): 21 Feet Assistive device: Rolling walker (2 wheeled) Gait Pattern/deviations: Wide base of support;Shuffle Gait velocity: Continues with slow velocity       Stairs             Wheelchair Mobility    Modified Rankin (Stroke  Patients Only)       Balance Overall balance assessment: Mild deficits observed, not formally tested Sitting-balance support: Bilateral upper extremity supported Sitting balance-Leahy Scale: Good Sitting balance - Comments: Hindered by fatigue with min exertion   Standing balance support: Single extremity supported Standing balance-Leahy Scale: Fair                              Cognition Arousal/Alertness: Awake/alert Behavior During Therapy: WFL for tasks assessed/performed Overall Cognitive Status: Within Functional Limits for tasks assessed                                 General Comments: He did complain of fatigue after ambulating, but on RA O2 sats stable. Did position self in seated with FF posture to slow breathing.      Exercises General Exercises - Lower Extremity Ankle Circles/Pumps: AROM Short Arc Quad: AROM Long Arc Quad: AROM Hip Flexion/Marching: AROM Other Exercises Other Exercises: Reminded of proper breathing techniques for optimal energy conservation.- reviewed with return demo    General Comments General comments (skin integrity, edema, etc.): Patient ambulated from bed, to door, window and positioned in BS chair at end of ambulation. Min A, RW, cues for postural awareness      Pertinent Vitals/Pain Pain Assessment: No/denies pain    Home Living  Prior Function            PT Goals (current goals can now be found in the care plan section) Acute Rehab PT Goals Patient Stated Goal: To go home PT Goal Formulation: With patient Time For Goal Achievement: 03/28/19 Potential to Achieve Goals: Good Additional Goals Additional Goal #1: complete standardized balance assessment without an assistive device, as appropriate to assist with measuring fall risk Progress towards PT goals: Progressing toward goals    Frequency    Min 3X/week      PT Plan Current plan remains appropriate     Co-evaluation              AM-PAC PT "6 Clicks" Mobility   Outcome Measure  Help needed turning from your back to your side while in a flat bed without using bedrails?: None Help needed moving from lying on your back to sitting on the side of a flat bed without using bedrails?: A Little Help needed moving to and from a bed to a chair (including a wheelchair)?: A Little Help needed standing up from a chair using your arms (e.g., wheelchair or bedside chair)?: A Little Help needed to walk in hospital room?: A Little Help needed climbing 3-5 steps with a railing? : A Lot 6 Click Score: 18    End of Session   Activity Tolerance: Patient limited by fatigue Patient left: in chair;with call bell/phone within reach   PT Visit Diagnosis: Muscle weakness (generalized) (M62.81)     Time: 0932-1000 PT Time Calculation (min) (ACUTE ONLY): 28 min  Charges:  $Gait Training: 8-22 mins $Therapeutic Exercise: 8-22 mins                     Gianny Sabino P, PT # [phone] CGV cell    Casandra Doffing 03/16/2019, 1:06 PM

## 2019-03-16 NOTE — Progress Notes (Signed)
   03/16/19 0804  Family/Significant Other Communication  Family/Significant Other Update Called;Updated  Went over POC with pt and wife per pt request. All questions asked and all questions answered.

## 2019-03-16 NOTE — Progress Notes (Signed)
PROGRESS NOTE    Nicholas Swanson  WCH:[id number] DOB: Nov 27, 1930 DOA: 02/23/2019 PCP: Iona Beard, MD  Brief Narrative: 85 year old gentleman former smoker, history of COPD, type 2 diabetes, hypertension, hyperlipidemia, stage III CKD was diagnosed with COVID-19 on 03/04/2019, returned to hospital with shortness of breath, cough and fatigue with generalized weakness.  Patient is poor historian.  Also had nausea and vomiting.  He was very lethargic at home as per patient's wife. In the emergency room 92% on room air, chest x-ray shows bilateral pneumonia left more than right.  Creatinine was 3.38.  Potassium 5.4.  Procalcitonin more than 150.  Lactic acid 2.7.  Started on steroid and remdesivir and admitted to [facility]  Assessment & Plan:   Principal Problem:   Pneumonia due to COVID-19 virus Active Problems:   Essential hypertension   Acute respiratory disease due to COVID-19 virus   Type 2 diabetes mellitus with stage 3 chronic kidney disease (HCC)   Elevated LFTs   AKI (acute kidney injury) on CKD III   Pneumonia due to COVID-19 virus: Continue to monitor due to significant symptoms on presentation.  Oxygenation improved and mostly remains on room air. Continue steroids, remdesivir completed  On abx for CAP, procalcitonin >150 on presentation - repeat 12/31 <0.10 I/O, daily weights IS, OOB, prone as able Daily inflammatory labs Blood cx NGTD x 4  COVID-19 Labs  Recent Labs    03/14/19 0142 03/15/19 0430 03/16/19 0607  DDIMER 1.49* 1.38* 1.59*  FERRITIN 598* 354* 289  CRP 9.3* 5.7* 3.7*    No results found for: SARSCOV2NAA  Acute on chronic renal failure: Stage IIIa at baseline. Creatinine peaked at 3.38 Baseline creatinine unknown 2.45 today, follow Continue to monitor and follow  Hyperkalemia: improved today, lokelma and follow  Hypertension: Blood pressure stable.  On amlodipine.  Holding valsartan and hydrochlorothiazide due to AKI.  Type 2  diabetes with hyperglycemia: Aggravated by steroid use. SSI and mealtime insulin.  Elevated LFTs: Normal liver ultrasound.  Hepatitis panel negative.  Probably due to acute COVID-19 infection.  Trending down.  We will continue monitoring.  DVT prophylaxis: lovenox Code Status: full  Family Communication: none at bedside - wife Disposition Plan: pending further improvemetn  Consultants:   none  Procedures:   none  Antimicrobials:  Anti-infectives (From admission, onward)   Start     Dose/Rate Route Frequency Ordered Stop   03/12/19 1000  remdesivir 100 mg in sodium chloride 0.9 % 100 mL IVPB     100 mg 200 mL/hr over 30 Minutes Intravenous Daily 02/23/2019 1855 03/15/19 0958   03/06/2019 2100  cefTRIAXone (ROCEPHIN) 1 g in sodium chloride 0.9 % 100 mL IVPB     1 g 200 mL/hr over 30 Minutes Intravenous Every 24 hours 02/23/2019 2057 03/15/19 2055   03/13/2019 2100  azithromycin (ZITHROMAX) 500 mg in sodium chloride 0.9 % 250 mL IVPB     500 mg 250 mL/hr over 60 Minutes Intravenous Every 24 hours 03/14/2019 2057 03/15/19 2229   03/01/2019 1900  remdesivir 200 mg in sodium chloride 0.9% 250 mL IVPB     200 mg 580 mL/hr over 30 Minutes Intravenous Once 02/15/2019 1855 03/08/2019 2015     Subjective: No new complaints.  Objective: Vitals:   03/16/19 0410 03/16/19 0803 03/16/19 1253 03/16/19 1500  BP:  (!) 166/76  (!) 134/57  Pulse:  75  75  Resp:  18  18  Temp:  98.9 F (37.2 C)  98.9 F (37.2  C)  TempSrc:  Oral  Oral  SpO2:  95% 97% 97%  Weight: 76.1 kg     Height:        Intake/Output Summary (Last 24 hours) at 03/16/2019 1603 Last data filed at 03/16/2019 0804 Gross per 24 hour  Intake 353 ml  Output 1250 ml  Net -897 ml   Filed Weights   03/14/19 0459 03/15/19 0500 03/16/19 0410  Weight: 77.1 kg 77.6 kg 76.1 kg    Examination:  General: No acute distress. Cardiovascular: RRR Lungs: Clear to auscultation bilaterally  Abdomen: Soft, nontender, nondistended    Neurological: Alert and oriented 3. Moves all extremities 4 . Cranial nerves II through XII grossly intact. Skin: Warm and dry. No rashes or lesions. Extremities: No clubbing or cyanosis. No edema  Data Reviewed: I have personally reviewed following labs and imaging studies  CBC: Recent Labs  Lab 03/12/19 0533 03/13/19 0420 03/14/19 0142 03/15/19 0430 03/16/19 0607  WBC 6.1 5.8 5.6 6.5 8.1  NEUTROABS 5.2 4.6 4.5 5.6 6.9  HGB 10.2* 8.9* 8.9* 9.4* 8.9*  HCT 31.9* 27.2* 26.7* 28.5* 27.0*  MCV 91.4 89.8 87.8 88.5 87.9  PLT 338 340 369 418* 242*   Basic Metabolic Panel: Recent Labs  Lab 03/12/19 0533 03/13/19 0420 03/14/19 0142 03/15/19 0430 03/15/19 1640 03/16/19 0607  NA 137 137 135 135 135 136  K 5.5* 5.5* 4.9 5.5* 5.2* 5.2*  CL 109 106 109 108 109 108  CO2 14* 15* 17* 17* 17* 18*  GLUCOSE 154* 127* 171* 210* 212* 204*  BUN 65* 84* 86* 84* 77* 78*  CREATININE 2.87* 2.94* 2.66* 2.49* 2.62* 2.45*  CALCIUM 7.8* 8.0* 7.9* 8.1* 8.4* 8.2*  MG 2.5* 2.7* 2.8* 2.8*  --  2.8*  PHOS 4.4 4.5 3.3 3.3  --  3.2   GFR: Estimated Creatinine Clearance: 23.3 mL/min (Nani Ingram) (by C-G formula based on SCr of 2.45 mg/dL (H)). Liver Function Tests: Recent Labs  Lab 03/12/19 0533 03/13/19 0420 03/14/19 0142 03/15/19 0430 03/16/19 0607  AST 121* 60* 39 30 26  ALT 154* 99* 80* 69* 58*  ALKPHOS 72 63 60 60 54  BILITOT 0.8 0.7 0.6 0.4 0.9  PROT 6.8 6.2* 6.0* 5.9* 5.7*  ALBUMIN 2.7* 2.5* 2.5* 2.5* 2.6*   No results for input(s): LIPASE, AMYLASE in the last 168 hours. No results for input(s): AMMONIA in the last 168 hours. Coagulation Profile: No results for input(s): INR, PROTIME in the last 168 hours. Cardiac Enzymes: No results for input(s): CKTOTAL, CKMB, CKMBINDEX, TROPONINI in the last 168 hours. BNP (last 3 results) No results for input(s): PROBNP in the last 8760 hours. HbA1C: No results for input(s): HGBA1C in the last 72 hours. CBG: Recent Labs  Lab 03/15/19 1228  03/15/19 1657 03/15/19 2115 03/16/19 0807 03/16/19 1150  GLUCAP 171* 203* 218* 189* 202*   Lipid Profile: No results for input(s): CHOL, HDL, LDLCALC, TRIG, CHOLHDL, LDLDIRECT in the last 72 hours. Thyroid Function Tests: No results for input(s): TSH, T4TOTAL, FREET4, T3FREE, THYROIDAB in the last 72 hours. Anemia Panel: Recent Labs    03/15/19 0430 03/16/19 0607  FERRITIN 354* 289   Sepsis Labs: Recent Labs  Lab 03/14/2019 1416 03/07/2019 1445 02/21/2019 1642 03/16/19 0607  PROCALCITON >150.00  --   --  <0.10  LATICACIDVEN  --  1.6 2.7*  --     Recent Results (from the past 240 hour(s))  Blood Culture (routine x 2)     Status: None   Collection Time:  03/09/2019  2:45 PM   Specimen: Right Antecubital; Blood  Result Value Ref Range Status   Specimen Description   Final    RIGHT ANTECUBITAL BOTTLES DRAWN AEROBIC AND ANAEROBIC   Special Requests Blood Culture adequate volume  Final   Culture   Final    NO GROWTH 5 DAYS Performed at [facility], [address], [place], [place] [address]    Report Status 03/16/2019 FINAL  Final  Blood Culture (routine x 2)     Status: None   Collection Time: 03/13/2019  2:59 PM   Specimen: BLOOD RIGHT WRIST  Result Value Ref Range Status   Specimen Description   Final    BLOOD RIGHT WRIST BOTTLES DRAWN AEROBIC AND ANAEROBIC   Special Requests Blood Culture adequate volume  Final   Culture   Final    NO GROWTH 5 DAYS Performed at [facility], [address], [place], [place] [address]    Report Status 03/16/2019 FINAL  Final         Radiology Studies: No results found.      Scheduled Meds: . albuterol  2 puff Inhalation Q6H  . amLODipine  5 mg Oral Daily  . vitamin C  500 mg Oral Daily  . aspirin EC  81 mg Oral Daily  . atorvastatin  40 mg Oral Daily  . dexamethasone (DECADRON) injection  6 mg Intravenous Q24H  . enoxaparin (LOVENOX) injection  30 mg Subcutaneous Q24H  . folic acid  1 mg Oral Daily  . guaiFENesin  600 mg  Oral BID  . insulin aspart  0-5 Units Subcutaneous QHS  . insulin aspart  0-9 Units Subcutaneous TID WC  . insulin aspart  3 Units Subcutaneous TID WC  . metoprolol succinate  50 mg Oral Daily  . multivitamin with minerals  1 tablet Oral Daily  . sodium chloride flush  3 mL Intravenous Q12H  . terazosin  5 mg Oral QHS  . thiamine  100 mg Oral Daily  . zinc sulfate  220 mg Oral Daily   Continuous Infusions: . sodium chloride 250 mL (03/12/19 2046)     LOS: 5 days    Time spent: over 30 min    Fayrene Helper, MD [facility] Pager AMION  If 7PM-7AM, please contact night-coverage [email or web address] Password [email or web address] 03/16/2019, 4:03 PM

## 2019-03-16 NOTE — Plan of Care (Signed)
Went over POC with pt and wife. All questions asked and all questions answered. 

## 2019-03-16 NOTE — Progress Notes (Signed)
Pt stable with no signs of distress. Report given to oncoming nurse. Safety maintained.  

## 2019-03-17 DIAGNOSIS — J1282 Pneumonia due to coronavirus disease 2019: Secondary | ICD-10-CM

## 2019-03-17 DIAGNOSIS — 419620001 Death: Secondary | SNOMED CT | POA: Diagnosis not present

## 2019-03-17 LAB — COMPREHENSIVE METABOLIC PANEL
ALT: 58 U/L — ABNORMAL HIGH (ref 0–44)
AST: 32 U/L (ref 15–41)
Albumin: 2.4 g/dL — ABNORMAL LOW (ref 3.5–5.0)
Alkaline Phosphatase: 53 U/L (ref 38–126)
Anion gap: 11 (ref 5–15)
BUN: 61 mg/dL — ABNORMAL HIGH (ref 8–23)
CO2: 17 mmol/L — ABNORMAL LOW (ref 22–32)
Calcium: 8.3 mg/dL — ABNORMAL LOW (ref 8.9–10.3)
Chloride: 108 mmol/L (ref 98–111)
Creatinine, Ser: 2.38 mg/dL — ABNORMAL HIGH (ref 0.61–1.24)
GFR calc Af Amer: 28 mL/min — ABNORMAL LOW (ref >60)
GFR calc non Af Amer: 24 mL/min — ABNORMAL LOW (ref >60)
Glucose, Bld: 211 mg/dL — ABNORMAL HIGH (ref 70–99)
Potassium: 4.7 mmol/L (ref 3.5–5.1)
Sodium: 136 mmol/L (ref 135–145)
Total Bilirubin: 0.5 mg/dL (ref 0.3–1.2)
Total Protein: 6 g/dL — ABNORMAL LOW (ref 6.5–8.1)

## 2019-03-17 LAB — GLUCOSE, CAPILLARY
Glucose-Capillary: 168 mg/dL — ABNORMAL HIGH (ref 70–99)
Glucose-Capillary: 184 mg/dL — ABNORMAL HIGH (ref 70–99)
Glucose-Capillary: 155 mg/dL — ABNORMAL HIGH (ref 70–99)
Glucose-Capillary: 171 mg/dL — ABNORMAL HIGH (ref 70–99)

## 2019-03-17 LAB — CBC WITH DIFFERENTIAL/PLATELET
Abs Immature Granulocytes: 0.12 10*3/uL — ABNORMAL HIGH (ref 0.00–0.07)
Basophils Absolute: 0 10*3/uL (ref 0.0–0.1)
Basophils Relative: 0 %
Eosinophils Absolute: 0 10*3/uL (ref 0.0–0.5)
Eosinophils Relative: 0 %
HCT: 29 % — ABNORMAL LOW (ref 39.0–52.0)
Hemoglobin: 9.5 g/dL — ABNORMAL LOW (ref 13.0–17.0)
Immature Granulocytes: 1 %
Lymphocytes Relative: 10 %
Lymphs Abs: 1 10*3/uL (ref 0.7–4.0)
MCH: 29.1 pg (ref 26.0–34.0)
MCHC: 32.8 g/dL (ref 30.0–36.0)
MCV: 88.7 fL (ref 80.0–100.0)
Monocytes Absolute: 1.4 10*3/uL — ABNORMAL HIGH (ref 0.1–1.0)
Monocytes Relative: 14 %
Neutro Abs: 7.2 10*3/uL (ref 1.7–7.7)
Neutrophils Relative %: 75 %
Platelets: 506 10*3/uL — ABNORMAL HIGH (ref 150–400)
RBC: 3.27 MIL/uL — ABNORMAL LOW (ref 4.22–5.81)
RDW: 14.6 % (ref 11.5–15.5)
WBC: 9.7 10*3/uL (ref 4.0–10.5)
nRBC: 0 % (ref 0.0–0.2)

## 2019-03-17 LAB — PROCALCITONIN: Procalcitonin: 6.77 ng/mL

## 2019-03-17 LAB — C-REACTIVE PROTEIN: CRP: 10.6 mg/dL — ABNORMAL HIGH (ref <1.0)

## 2019-03-17 LAB — MAGNESIUM: Magnesium: 2.2 mg/dL (ref 1.7–2.4)

## 2019-03-17 LAB — FERRITIN: Ferritin: 309 ng/mL (ref 24–336)

## 2019-03-17 LAB — D-DIMER, QUANTITATIVE: D-Dimer, Quant: 1.74 ug/mL-FEU — ABNORMAL HIGH (ref 0.00–0.50)

## 2019-03-17 LAB — PHOSPHORUS: Phosphorus: 2 mg/dL — ABNORMAL LOW (ref 2.5–4.6)

## 2019-03-17 NOTE — Progress Notes (Signed)
PROGRESS NOTE    Nicholas Swanson  YWV:[id number] DOB: Jun 06, 1930 DOA: 03/10/2019 PCP: Iona Beard, MD  Brief Narrative: 86 year old gentleman former smoker, history of COPD, type 2 diabetes, hypertension, hyperlipidemia, stage III CKD was diagnosed with COVID-19 on 03/04/2019, returned to hospital with shortness of breath, cough and fatigue with generalized weakness.  Patient is poor historian.  Also had nausea and vomiting.  He was very lethargic at home as per patient's wife. In the emergency room 92% on room air, chest x-ray shows bilateral pneumonia left more than right.  Creatinine was 3.38.  Potassium 5.4.  Procalcitonin more than 150.  Lactic acid 2.7.  Started on steroid and remdesivir and admitted to [facility].  Assessment & Plan:   Principal Problem:   Pneumonia due to COVID-19 virus Active Problems:   Essential hypertension   Acute respiratory disease due to COVID-19 virus   Type 2 diabetes mellitus with stage 3 chronic kidney disease (HCC)   Elevated LFTs   AKI (acute kidney injury) on CKD III   Pneumonia due to COVID-19 virus: Continue to monitor due to significant symptoms on presentation.  Oxygenation improved and mostly remains on room air. Continue steroids, remdesivir completed  On abx for CAP, procalcitonin >150 on presentation - repeat 12/31 <0.10.  On 1/1 6.77, unclear significance of this.  Will continue to monitor.  No fevers, normal WBC.  Follow while admitted. I/O, daily weights IS, OOB, prone as able Daily inflammatory labs Blood cx NGTD x 5  COVID-19 Labs  Recent Labs    03/15/19 0430 03/16/19 0607 03/17/19 0955  DDIMER 1.38* 1.59* 1.74*  FERRITIN 354* 289 309  CRP 5.7* 3.7* 10.6*    No results found for: SARSCOV2NAA  Acute on chronic renal failure: Stage IIIa at baseline. Creatinine peaked at 3.38 Baseline creatinine unknown 2.38 today Continue to monitor and follow  Hyperkalemia: improved today, lokelma and  follow  Hypertension: Blood pressure stable.  On amlodipine.  Holding valsartan and hydrochlorothiazide due to AKI.  Type 2 diabetes with hyperglycemia: Aggravated by steroid use. SSI and mealtime insulin.  Elevated LFTs: Normal liver ultrasound.  Hepatitis panel negative.  Probably due to acute COVID-19 infection.  Trending down.  We will continue monitoring.  DVT prophylaxis: lovenox Code Status: full  Family Communication: none at bedside - wife Disposition Plan: pending further improvemetn - hopefully discharge 1/2, appreciate SW assistance with setting up home health (this is proving difficult)  Consultants:   none  Procedures:   none  Antimicrobials:  Anti-infectives (From admission, onward)   Start     Dose/Rate Route Frequency Ordered Stop   03/12/19 1000  remdesivir 100 mg in sodium chloride 0.9 % 100 mL IVPB     100 mg 200 mL/hr over 30 Minutes Intravenous Daily 03/05/2019 1855 03/15/19 0958   02/27/2019 2100  cefTRIAXone (ROCEPHIN) 1 g in sodium chloride 0.9 % 100 mL IVPB     1 g 200 mL/hr over 30 Minutes Intravenous Every 24 hours 03/13/2019 2057 03/15/19 2055   02/26/2019 2100  azithromycin (ZITHROMAX) 500 mg in sodium chloride 0.9 % 250 mL IVPB     500 mg 250 mL/hr over 60 Minutes Intravenous Every 24 hours 03/14/2019 2057 03/15/19 2229   02/22/2019 1900  remdesivir 200 mg in sodium chloride 0.9% 250 mL IVPB     200 mg 580 mL/hr over 30 Minutes Intravenous Once 03/16/2019 1855 03/12/2019 2015     Subjective: No new complaints  Objective: Vitals:   03/17/19 0417  03/17/19 0421 03/17/19 0640 03/17/19 0700  BP: (!) 155/57   117/71  Pulse: 87   (!) 106  Resp: 18  20 18   Temp: 98.8 F (37.1 C)   98.6 F (37 C)  TempSrc: Oral   Axillary  SpO2: 95%   96%  Weight:  76.4 kg    Height:        Intake/Output Summary (Last 24 hours) at 03/17/2019 1612 Last data filed at 03/17/2019 1300 Gross per 24 hour  Intake 1490 ml  Output 1200 ml  Net 290 ml   Filed Weights    03/15/19 0500 03/16/19 0410 03/17/19 0421  Weight: 77.6 kg 76.1 kg 76.4 kg    Examination:  General: No acute distress. Cardiovascular: RRR Lungs: Clear to auscultation bilaterally  Abdomen: Soft, nontender, nondistended  Neurological: Alert and oriented 3. Moves all extremities 4. Cranial nerves II through XII grossly intact. Skin: Warm and dry. No rashes or lesions. Extremities: No clubbing or cyanosis. No edema.  Data Reviewed: I have personally reviewed following labs and imaging studies  CBC: Recent Labs  Lab 03/13/19 0420 03/14/19 0142 03/15/19 0430 03/16/19 0607 03/17/19 0955  WBC 5.8 5.6 6.5 8.1 9.7  NEUTROABS 4.6 4.5 5.6 6.9 7.2  HGB 8.9* 8.9* 9.4* 8.9* 9.5*  HCT 27.2* 26.7* 28.5* 27.0* 29.0*  MCV 89.8 87.8 88.5 87.9 88.7  PLT 340 369 418* 450* 841*   Basic Metabolic Panel: Recent Labs  Lab 03/13/19 0420 03/14/19 0142 03/15/19 0430 03/15/19 1640 03/16/19 0607 03/17/19 0955  NA 137 135 135 135 136 136  K 5.5* 4.9 5.5* 5.2* 5.2* 4.7  CL 106 109 108 109 108 108  CO2 15* 17* 17* 17* 18* 17*  GLUCOSE 127* 171* 210* 212* 204* 211*  BUN 84* 86* 84* 77* 78* 61*  CREATININE 2.94* 2.66* 2.49* 2.62* 2.45* 2.38*  CALCIUM 8.0* 7.9* 8.1* 8.4* 8.2* 8.3*  MG 2.7* 2.8* 2.8*  --  2.8* 2.2  PHOS 4.5 3.3 3.3  --  3.2 2.0*   GFR: Estimated Creatinine Clearance: 24.1 mL/min (Antaniya Venuti) (by C-G formula based on SCr of 2.38 mg/dL (H)). Liver Function Tests: Recent Labs  Lab 03/13/19 0420 03/14/19 0142 03/15/19 0430 03/16/19 0607 03/17/19 0955  AST 60* 39 30 26 32  ALT 99* 80* 69* 58* 58*  ALKPHOS 63 60 60 54 53  BILITOT 0.7 0.6 0.4 0.9 0.5  PROT 6.2* 6.0* 5.9* 5.7* 6.0*  ALBUMIN 2.5* 2.5* 2.5* 2.6* 2.4*   No results for input(s): LIPASE, AMYLASE in the last 168 hours. No results for input(s): AMMONIA in the last 168 hours. Coagulation Profile: No results for input(s): INR, PROTIME in the last 168 hours. Cardiac Enzymes: No results for input(s): CKTOTAL, CKMB,  CKMBINDEX, TROPONINI in the last 168 hours. BNP (last 3 results) No results for input(s): PROBNP in the last 8760 hours. HbA1C: No results for input(s): HGBA1C in the last 72 hours. CBG: Recent Labs  Lab 03/16/19 1150 03/16/19 1702 03/16/19 2139 03/16/19 2330 03/17/19 1148  GLUCAP 202* 127* 83 192* 168*   Lipid Profile: No results for input(s): CHOL, HDL, LDLCALC, TRIG, CHOLHDL, LDLDIRECT in the last 72 hours. Thyroid Function Tests: No results for input(s): TSH, T4TOTAL, FREET4, T3FREE, THYROIDAB in the last 72 hours. Anemia Panel: Recent Labs    03/16/19 0607 03/17/19 0955  FERRITIN 289 309   Sepsis Labs: Recent Labs  Lab 03/07/2019 1416 03/05/2019 1445 03/14/2019 1642 03/16/19 0607 03/17/19 0955  PROCALCITON >150.00  --   --  <  0.10 6.77  LATICACIDVEN  --  1.6 2.7*  --   --     Recent Results (from the past 240 hour(s))  Blood Culture (routine x 2)     Status: None   Collection Time: 03/08/2019  2:45 PM   Specimen: Right Antecubital; Blood  Result Value Ref Range Status   Specimen Description   Final    RIGHT ANTECUBITAL BOTTLES DRAWN AEROBIC AND ANAEROBIC   Special Requests Blood Culture adequate volume  Final   Culture   Final    NO GROWTH 5 DAYS Performed at [facility], [address] [place], [place] [address]    Report Status 03/16/2019 FINAL  Final  Blood Culture (routine x 2)     Status: None   Collection Time: 02/14/2019  2:59 PM   Specimen: BLOOD RIGHT WRIST  Result Value Ref Range Status   Specimen Description   Final    BLOOD RIGHT WRIST BOTTLES DRAWN AEROBIC AND ANAEROBIC   Special Requests Blood Culture adequate volume  Final   Culture   Final    NO GROWTH 5 DAYS Performed at [facility], [address], [place], [place] [address]    Report Status 03/16/2019 FINAL  Final         Radiology Studies: No results found.      Scheduled Meds: . albuterol  2 puff Inhalation Q6H  . amLODipine  5 mg Oral Daily  . vitamin C  500 mg Oral Daily   . aspirin EC  81 mg Oral Daily  . atorvastatin  40 mg Oral Daily  . dexamethasone (DECADRON) injection  6 mg Intravenous Q24H  . enoxaparin (LOVENOX) injection  30 mg Subcutaneous Q24H  . folic acid  1 mg Oral Daily  . guaiFENesin  600 mg Oral BID  . insulin aspart  0-9 Units Subcutaneous TID WC  . metoprolol succinate  50 mg Oral Daily  . multivitamin with minerals  1 tablet Oral Daily  . sodium chloride flush  3 mL Intravenous Q12H  . terazosin  5 mg Oral QHS  . thiamine  100 mg Oral Daily  . zinc sulfate  220 mg Oral Daily   Continuous Infusions: . sodium chloride 250 mL (03/12/19 2046)     LOS: 6 days    Time spent: over 30 min    Fayrene Helper, MD [facility] Pager AMION  If 7PM-7AM, please contact night-coverage [email or web address] Password [facility] 03/17/2019, 4:12 PM

## 2019-03-17 NOTE — Progress Notes (Signed)
CSW aware of [facility] orders placed- CSW is awaiting responses from; [facility], [facility], [facility], and [facility] to determine who can accept for [facility] PT/OT.   Kingsley Spittle, LCSW [facility]  [phone]

## 2019-03-17 NOTE — Plan of Care (Signed)
Went over POC with pt and wife. All questions asked and all questions answered. 

## 2019-03-17 DEATH — deceased

## 2019-03-18 ENCOUNTER — Inpatient Hospital Stay (HOSPITAL_COMMUNITY): Payer: Medicare Other

## 2019-03-18 LAB — CBC WITH DIFFERENTIAL/PLATELET
Abs Immature Granulocytes: 0.19 10*3/uL — ABNORMAL HIGH (ref 0.00–0.07)
Basophils Absolute: 0 10*3/uL (ref 0.0–0.1)
Basophils Relative: 0 %
Eosinophils Absolute: 0.1 10*3/uL (ref 0.0–0.5)
Eosinophils Relative: 1 %
HCT: 29.1 % — ABNORMAL LOW (ref 39.0–52.0)
Hemoglobin: 9.6 g/dL — ABNORMAL LOW (ref 13.0–17.0)
Immature Granulocytes: 2 %
Lymphocytes Relative: 11 %
Lymphs Abs: 0.9 10*3/uL (ref 0.7–4.0)
MCH: 29.4 pg (ref 26.0–34.0)
MCHC: 33 g/dL (ref 30.0–36.0)
MCV: 89 fL (ref 80.0–100.0)
Monocytes Absolute: 1.3 10*3/uL — ABNORMAL HIGH (ref 0.1–1.0)
Monocytes Relative: 17 %
Neutro Abs: 5.3 10*3/uL (ref 1.7–7.7)
Neutrophils Relative %: 69 %
Platelets: 484 10*3/uL — ABNORMAL HIGH (ref 150–400)
RBC: 3.27 MIL/uL — ABNORMAL LOW (ref 4.22–5.81)
RDW: 14.6 % (ref 11.5–15.5)
WBC: 7.8 10*3/uL (ref 4.0–10.5)
nRBC: 0 % (ref 0.0–0.2)

## 2019-03-18 LAB — COMPREHENSIVE METABOLIC PANEL
ALT: 52 U/L — ABNORMAL HIGH (ref 0–44)
AST: 26 U/L (ref 15–41)
Albumin: 2.6 g/dL — ABNORMAL LOW (ref 3.5–5.0)
Alkaline Phosphatase: 57 U/L (ref 38–126)
Anion gap: 11 (ref 5–15)
BUN: 53 mg/dL — ABNORMAL HIGH (ref 8–23)
CO2: 19 mmol/L — ABNORMAL LOW (ref 22–32)
Calcium: 8.1 mg/dL — ABNORMAL LOW (ref 8.9–10.3)
Chloride: 103 mmol/L (ref 98–111)
Creatinine, Ser: 2.34 mg/dL — ABNORMAL HIGH (ref 0.61–1.24)
GFR calc Af Amer: 28 mL/min — ABNORMAL LOW (ref >60)
GFR calc non Af Amer: 24 mL/min — ABNORMAL LOW (ref >60)
Glucose, Bld: 157 mg/dL — ABNORMAL HIGH (ref 70–99)
Potassium: 4.6 mmol/L (ref 3.5–5.1)
Sodium: 133 mmol/L — ABNORMAL LOW (ref 135–145)
Total Bilirubin: 0.8 mg/dL (ref 0.3–1.2)
Total Protein: 5.9 g/dL — ABNORMAL LOW (ref 6.5–8.1)

## 2019-03-18 LAB — FERRITIN: Ferritin: 345 ng/mL — ABNORMAL HIGH (ref 24–336)

## 2019-03-18 LAB — C-REACTIVE PROTEIN: CRP: 15 mg/dL — ABNORMAL HIGH (ref <1.0)

## 2019-03-18 LAB — MAGNESIUM: Magnesium: 2.3 mg/dL (ref 1.7–2.4)

## 2019-03-18 LAB — D-DIMER, QUANTITATIVE: D-Dimer, Quant: 1.9 ug/mL-FEU — ABNORMAL HIGH (ref 0.00–0.50)

## 2019-03-18 LAB — GLUCOSE, CAPILLARY
Glucose-Capillary: 178 mg/dL — ABNORMAL HIGH (ref 70–99)
Glucose-Capillary: 326 mg/dL — ABNORMAL HIGH (ref 70–99)

## 2019-03-18 LAB — PROCALCITONIN: Procalcitonin: 4.14 ng/mL

## 2019-03-18 LAB — PHOSPHORUS: Phosphorus: 2.1 mg/dL — ABNORMAL LOW (ref 2.5–4.6)

## 2019-03-18 MED ORDER — AMOXICILLIN-POT CLAVULANATE 500-125 MG PO TABS
1.0000 | ORAL_TABLET | Freq: Two times a day (BID) | ORAL | Status: DC
  Administered 2019-03-18 – 2019-03-20 (×4): 500 mg via ORAL
  Filled 2019-03-18 (×5): qty 1

## 2019-03-18 NOTE — TOC Initial Note (Addendum)
Transition of Care [facility]) - Initial/Assessment Note    Patient Details  Name: Nicholas Swanson MRN: [id number] Date of Birth: [birth year]  Transition of Care [facility]) CM/SW Contact:    Alberteen Sam, [place] Phone Number: [phone] 03/18/2019, 8:38 AM  Clinical Narrative:                  Update: [facility] accepted referral, [name] informed. No further dc needs identified at this time.    CSW spoke with patient's wife [name] regarding home health PT and OT recommendations. [name] is in agreement and reports she has been active with [facility] in the past and this is her preference. She is in agreement with CSW faxing referrals out to other home health agencies if [facility] cannot take. [name] would like to be informed if patient is discharging today so she can arrange a family member to pick up. CSW will call [name] back and inform of accepting home health agency.   CSW has reached out to [facility] with [facility], he is reviewing referral, pending response.   Expected Discharge Plan: [place] Barriers to Discharge: No Barriers Identified   Patient Goals and CMS Choice   CMS Medicare.gov Compare Post Acute Care list provided to:: Patient Represenative (must comment)(Margaret (spouse)) Choice offered to / list presented to : Spouse  Expected Discharge Plan and Services Expected Discharge Plan: [place] Acute Care Choice: [place] arrangements for the past 2 months: [place] Expected Discharge Date: 03/17/19                         HH Arranged: PT, OT HH Agency: [facility] ([place]) Date HH Agency Contacted: 03/18/19 Time HH Agency Contacted: 0830 Representative spoke with at [place]: Corene Cornea  Prior Living Arrangements/Services Living arrangements for the past 2 months: [place] Lives with:: Spouse Patient language and need for interpreter reviewed::  Yes Do you feel safe going back to the place where you live?: Yes      Need for Family Participation in Patient Care: Yes (Comment) Care giver support system in place?: Yes (comment)   Criminal Activity/Legal Involvement Pertinent to Current Situation/Hospitalization: No - Comment as needed  Activities of Daily Living Home Assistive Devices/Equipment: None ADL Screening (condition at time of admission) Patient's cognitive ability adequate to safely complete daily activities?: Yes Is the patient deaf or have difficulty hearing?: No Does the patient have difficulty seeing, even when wearing glasses/contacts?: No Does the patient have difficulty concentrating, remembering, or making decisions?: No Patient able to express need for assistance with ADLs?: No Does the patient have difficulty dressing or bathing?: No Independently performs ADLs?: Yes (appropriate for developmental age) Does the patient have difficulty walking or climbing stairs?: No Weakness of Legs: None Weakness of Arms/Hands: None  Permission Sought/Granted Permission sought to share information with : Case Manager, Customer service manager, Family Supports Permission granted to share information with : Yes, Verbal Permission Granted  Share Information with NAME: [name]  Permission granted to share info w AGENCY: [place] granted to share info w Relationship: spouse  Permission granted to share info w Contact Information: [phone]  Emotional Assessment Appearance:: Other (Comment Required(unable to assess - remote) Attitude/Demeanor/Rapport: Unable to Assess Affect (typically observed): Unable to Assess Orientation: : Oriented to Self, Oriented to Place, Oriented to Situation Alcohol / Substance Use: Not  Applicable Psych Involvement: No (comment)  Admission diagnosis:  Hyperkalemia [E87.5] Renal insufficiency [N28.9] Elevated LFTs [R79.89] Pneumonia due to COVID-19 virus [U07.1,  J12.82] Acute respiratory disease due to COVID-19 virus [U07.1, J06.9] COVID-19 [U07.1] Patient Active Problem List   Diagnosis Date Noted  . Acute respiratory disease due to COVID-19 virus 03/16/2019  . Type 2 diabetes mellitus with stage 3 chronic kidney disease ([place]) 03/07/2019  . Pneumonia due to COVID-19 virus 02/15/2019  . Elevated LFTs 02/14/2019  . AKI (acute kidney injury) on CKD III 02/20/2019  . HYPERLIPIDEMIA 03/31/2006  . OVERWEIGHT 03/31/2006  . DIABETES MELLITUS, TYPE II 12/17/2005  . CARPAL TUNNEL SYNDROME 12/17/2005  . Essential hypertension 12/17/2005  . Chronic airway obstruction, not elsewhere classified 12/17/2005  . RENAL INSUFFICIENCY 12/17/2005  . PROSTATE CANCER, HX OF 12/17/2005   PCP:  Iona Beard, MD Pharmacy:   [place], [place] [address] Phone: [phone] Fax: [phone]     Social Determinants of Health (SDOH) Interventions    Readmission Risk Interventions No flowsheet data found.

## 2019-03-18 NOTE — Plan of Care (Signed)
Went over POC with pt and wife. All questions asked and all questions answered. 

## 2019-03-18 NOTE — Progress Notes (Signed)
PROGRESS NOTE    Nicholas Swanson  UUV:[id number] DOB: 21-May-1930 DOA: 03/02/2019 PCP: Nicholas Beard, MD  Brief Narrative: 86 year old gentleman former smoker, history of COPD, type 2 diabetes, hypertension, hyperlipidemia, stage III CKD was diagnosed with COVID-19 on 03/04/2019, returned to hospital with shortness of breath, cough and fatigue with generalized weakness.  Patient is poor historian.  Also had nausea and vomiting.  He was very lethargic at home as per patient's wife. In the emergency room 92% on room air, chest x-ray shows bilateral pneumonia left more than right.  Creatinine was 3.38.  Potassium 5.4.  Procalcitonin more than 150.  Lactic acid 2.7.  Started on steroid and remdesivir and admitted to [facility].  Assessment & Plan:   Principal Problem:   Pneumonia due to COVID-19 virus Active Problems:   Essential hypertension   Acute respiratory disease due to COVID-19 virus   Type 2 diabetes mellitus with stage 3 chronic kidney disease (HCC)   Elevated LFTs   AKI (acute kidney injury) on CKD III   Pneumonia due to COVID-19 virus  Possible Superimposed Bacterial Pneumonia: Continue to monitor due to significant symptoms on presentation.  Oxygenation improved and mostly remains on room air. Continue steroids, remdesivir completed  On abx for CAP, procalcitonin >150 on presentation - repeat 12/31 <0.10.  On 1/1 procalcitonin was 6.77, unclear significance of this.  His CRP rose to 15 today and his procalcitonin remains elevated to 4.14.     Fever on 1/2 to 100.5 Repeat blood cultures, follow urinalysis CXR 03/18/19 with patchy airspace opacities within R mid to lower lung field and at the L lung base Will start augmentin for possible superimposed bacterial pneumonia I/O, daily weights IS, OOB, prone as able Daily inflammatory labs Blood cx NGTD x 5  COVID-19 Labs  Recent Labs    03/16/19 0607 03/17/19 0955 03/18/19 0420  DDIMER 1.59* 1.74* 1.90*  FERRITIN  289 309 345*  CRP 3.7* 10.6* 15.0*    No results found for: SARSCOV2NAA  Acute on chronic renal failure: Stage IIIa at baseline. Creatinine peaked at 3.38 Baseline creatinine unknown 2.34 today Continue to monitor and follow  Hyperkalemia: improved today, lokelma and follow  Hypertension: Blood pressure stable.  On amlodipine.  Holding valsartan and hydrochlorothiazide due to AKI.  Type 2 diabetes with hyperglycemia: Aggravated by steroid use. SSI and mealtime insulin.  Elevated LFTs: Normal liver ultrasound.  Hepatitis panel negative.  Probably due to acute COVID-19 infection.  Trending down.  We will continue monitoring.  DVT prophylaxis: lovenox Code Status: full  Family Communication: none at bedside - wife Disposition Plan: pending further improvement, discharge on hold with worsening inflammatory markers and recurrent fever  Consultants:   none  Procedures:   none  Antimicrobials:  Anti-infectives (From admission, onward)   Start     Dose/Rate Route Frequency Ordered Stop   03/12/19 1000  remdesivir 100 mg in sodium chloride 0.9 % 100 mL IVPB     100 mg 200 mL/hr over 30 Minutes Intravenous Daily 03/08/2019 1855 03/15/19 0958   03/16/2019 2100  cefTRIAXone (ROCEPHIN) 1 g in sodium chloride 0.9 % 100 mL IVPB     1 g 200 mL/hr over 30 Minutes Intravenous Every 24 hours 02/20/2019 2057 03/15/19 2055   03/01/2019 2100  azithromycin (ZITHROMAX) 500 mg in sodium chloride 0.9 % 250 mL IVPB     500 mg 250 mL/hr over 60 Minutes Intravenous Every 24 hours 03/01/2019 2057 03/15/19 2229   03/01/2019 1900  remdesivir 200 mg in sodium chloride 0.9% 250 mL IVPB     200 mg 580 mL/hr over 30 Minutes Intravenous Once 03/10/2019 1855 03/12/2019 2015     Subjective: No new complaints  Objective: Vitals:   03/18/19 0418 03/18/19 0511 03/18/19 0620 03/18/19 0748  BP:    (!) 138/53  Pulse:    88  Resp:    18  Temp:  100 F (37.8 C) 99.7 F (37.6 C) 99.2 F (37.3 C)  TempSrc:  Oral  Oral Oral  SpO2:    95%  Weight: 77.1 kg     Height:        Intake/Output Summary (Last 24 hours) at 03/18/2019 1620 Last data filed at 03/18/2019 1500 Gross per 24 hour  Intake 170 ml  Output 600 ml  Net -430 ml   Filed Weights   03/16/19 0410 03/17/19 0421 03/18/19 0418  Weight: 76.1 kg 76.4 kg 77.1 kg    Examination:  General: No acute distress. Cardiovascular: Heart sounds show Nicholas Swanson regular rate, and rhythm.  Lungs: Clear to auscultation bilaterally  Abdomen: Soft, nontender, nondistended Neurological: Alert and oriented 3. Moves all extremities 4 . Cranial nerves II through XII grossly intact. Skin: Warm and dry. No rashes or lesions. Extremities: No clubbing or cyanosis. No edema.    Data Reviewed: I have personally reviewed following labs and imaging studies  CBC: Recent Labs  Lab 03/14/19 0142 03/15/19 0430 03/16/19 0607 03/17/19 0955 03/18/19 0420  WBC 5.6 6.5 8.1 9.7 7.8  NEUTROABS 4.5 5.6 6.9 7.2 5.3  HGB 8.9* 9.4* 8.9* 9.5* 9.6*  HCT 26.7* 28.5* 27.0* 29.0* 29.1*  MCV 87.8 88.5 87.9 88.7 89.0  PLT 369 418* 450* 506* 026*   Basic Metabolic Panel: Recent Labs  Lab 03/14/19 0142 03/15/19 0430 03/15/19 1640 03/16/19 0607 03/17/19 0955 03/18/19 0420  NA 135 135 135 136 136 133*  K 4.9 5.5* 5.2* 5.2* 4.7 4.6  CL 109 108 109 108 108 103  CO2 17* 17* 17* 18* 17* 19*  GLUCOSE 171* 210* 212* 204* 211* 157*  BUN 86* 84* 77* 78* 61* 53*  CREATININE 2.66* 2.49* 2.62* 2.45* 2.38* 2.34*  CALCIUM 7.9* 8.1* 8.4* 8.2* 8.3* 8.1*  MG 2.8* 2.8*  --  2.8* 2.2 2.3  PHOS 3.3 3.3  --  3.2 2.0* 2.1*   GFR: Estimated Creatinine Clearance: 24.7 mL/min (Nicholas Swanson) (by C-G formula based on SCr of 2.34 mg/dL (H)). Liver Function Tests: Recent Labs  Lab 03/14/19 0142 03/15/19 0430 03/16/19 0607 03/17/19 0955 03/18/19 0420  AST 39 30 26 32 26  ALT 80* 69* 58* 58* 52*  ALKPHOS 60 60 54 53 57  BILITOT 0.6 0.4 0.9 0.5 0.8  PROT 6.0* 5.9* 5.7* 6.0* 5.9*  ALBUMIN 2.5* 2.5*  2.6* 2.4* 2.6*   No results for input(s): LIPASE, AMYLASE in the last 168 hours. No results for input(s): AMMONIA in the last 168 hours. Coagulation Profile: No results for input(s): INR, PROTIME in the last 168 hours. Cardiac Enzymes: No results for input(s): CKTOTAL, CKMB, CKMBINDEX, TROPONINI in the last 168 hours. BNP (last 3 results) No results for input(s): PROBNP in the last 8760 hours. HbA1C: No results for input(s): HGBA1C in the last 72 hours. CBG: Recent Labs  Lab 03/17/19 1148 03/17/19 1613 03/17/19 2136 03/17/19 2208 03/18/19 1145  GLUCAP 168* 184* 155* 171* 178*   Lipid Profile: No results for input(s): CHOL, HDL, LDLCALC, TRIG, CHOLHDL, LDLDIRECT in the last 72 hours. Thyroid Function Tests: No results  for input(s): TSH, T4TOTAL, FREET4, T3FREE, THYROIDAB in the last 72 hours. Anemia Panel: Recent Labs    03/17/19 0955 03/18/19 0420  FERRITIN 309 345*   Sepsis Labs: Recent Labs  Lab 03/04/2019 1642 03/16/19 0607 03/17/19 0955 03/18/19 1010  PROCALCITON  --  <0.10 6.77 4.14  LATICACIDVEN 2.7*  --   --   --     Recent Results (from the past 240 hour(s))  Blood Culture (routine x 2)     Status: None   Collection Time: 03/02/2019  2:45 PM   Specimen: Right Antecubital; Blood  Result Value Ref Range Status   Specimen Description   Final    RIGHT ANTECUBITAL BOTTLES DRAWN AEROBIC AND ANAEROBIC   Special Requests Blood Culture adequate volume  Final   Culture   Final    NO GROWTH 5 DAYS Performed at [facility], [address] [place], [place] [address]    Report Status 03/16/2019 FINAL  Final  Blood Culture (routine x 2)     Status: None   Collection Time: 02/16/2019  2:59 PM   Specimen: BLOOD RIGHT WRIST  Result Value Ref Range Status   Specimen Description   Final    BLOOD RIGHT WRIST BOTTLES DRAWN AEROBIC AND ANAEROBIC   Special Requests Blood Culture adequate volume  Final   Culture   Final    NO GROWTH 5 DAYS Performed at [facility],  [address] [place], [place] [address]    Report Status 03/16/2019 FINAL  Final         Radiology Studies: DG CHEST PORT 1 VIEW  Result Date: 03/18/2019 CLINICAL DATA:  Hypoxia EXAM: PORTABLE CHEST 1 VIEW COMPARISON:  Chest radiographs 03/10/2019 and earlier FINDINGS: Heart size within normal limits. Again demonstrated are patchy airspace opacities within the right mid to lower lung field and left lung base. Findings are more conspicuous on the right as compared to examination 03/10/2019. No evidence of pleural effusion or pneumothorax. IMPRESSION: Redemonstrated patchy airspace opacities within the right mid to lower lung field and at the left lung base. Findings have increased in conspicuity on the right as compared to prior examination 03/05/2019. Electronically Signed   By: Kellie Simmering DO   On: 03/18/2019 10:15        Scheduled Meds: . albuterol  2 puff Inhalation Q6H  . amLODipine  5 mg Oral Daily  . vitamin C  500 mg Oral Daily  . aspirin EC  81 mg Oral Daily  . atorvastatin  40 mg Oral Daily  . dexamethasone (DECADRON) injection  6 mg Intravenous Q24H  . enoxaparin (LOVENOX) injection  30 mg Subcutaneous Q24H  . folic acid  1 mg Oral Daily  . guaiFENesin  600 mg Oral BID  . insulin aspart  0-9 Units Subcutaneous TID WC  . metoprolol succinate  50 mg Oral Daily  . multivitamin with minerals  1 tablet Oral Daily  . sodium chloride flush  3 mL Intravenous Q12H  . terazosin  5 mg Oral QHS  . thiamine  100 mg Oral Daily  . zinc sulfate  220 mg Oral Daily   Continuous Infusions: . sodium chloride 250 mL (03/12/19 2046)     LOS: 7 days    Time spent: over 30 min    Fayrene Helper, MD [facility] Pager AMION  If 7PM-7AM, please contact night-coverage [email or web address] Password [email or web address] 03/18/2019, 4:20 PM

## 2019-03-18 NOTE — Progress Notes (Signed)
   03/18/19 0900  Family/Significant Other Communication  Family/Significant Other Update Called;Updated  Called and updated. All questions asked and all questions answered.

## 2019-03-19 LAB — CBC WITH DIFFERENTIAL/PLATELET
Abs Immature Granulocytes: 0.11 10*3/uL — ABNORMAL HIGH (ref 0.00–0.07)
Basophils Absolute: 0 10*3/uL (ref 0.0–0.1)
Basophils Relative: 0 %
Eosinophils Absolute: 0 10*3/uL (ref 0.0–0.5)
Eosinophils Relative: 0 %
HCT: 25.2 % — ABNORMAL LOW (ref 39.0–52.0)
Hemoglobin: 8.3 g/dL — ABNORMAL LOW (ref 13.0–17.0)
Immature Granulocytes: 2 %
Lymphocytes Relative: 12 %
Lymphs Abs: 0.8 10*3/uL (ref 0.7–4.0)
MCH: 29 pg (ref 26.0–34.0)
MCHC: 32.9 g/dL (ref 30.0–36.0)
MCV: 88.1 fL (ref 80.0–100.0)
Monocytes Absolute: 1.1 10*3/uL — ABNORMAL HIGH (ref 0.1–1.0)
Monocytes Relative: 17 %
Neutro Abs: 4.3 10*3/uL (ref 1.7–7.7)
Neutrophils Relative %: 69 %
Platelets: 480 10*3/uL — ABNORMAL HIGH (ref 150–400)
RBC: 2.86 MIL/uL — ABNORMAL LOW (ref 4.22–5.81)
RDW: 14.5 % (ref 11.5–15.5)
WBC: 6.2 10*3/uL (ref 4.0–10.5)
nRBC: 0 % (ref 0.0–0.2)

## 2019-03-19 LAB — BASIC METABOLIC PANEL
Anion gap: 8 (ref 5–15)
BUN: 56 mg/dL — ABNORMAL HIGH (ref 8–23)
CO2: 18 mmol/L — ABNORMAL LOW (ref 22–32)
Calcium: 8.1 mg/dL — ABNORMAL LOW (ref 8.9–10.3)
Chloride: 106 mmol/L (ref 98–111)
Creatinine, Ser: 2.4 mg/dL — ABNORMAL HIGH (ref 0.61–1.24)
GFR calc Af Amer: 27 mL/min — ABNORMAL LOW (ref >60)
GFR calc non Af Amer: 24 mL/min — ABNORMAL LOW (ref >60)
Glucose, Bld: 270 mg/dL — ABNORMAL HIGH (ref 70–99)
Potassium: 5.8 mmol/L — ABNORMAL HIGH (ref 3.5–5.1)
Sodium: 132 mmol/L — ABNORMAL LOW (ref 135–145)

## 2019-03-19 LAB — URINALYSIS, ROUTINE W REFLEX MICROSCOPIC
Bacteria, UA: NONE SEEN
Bilirubin Urine: NEGATIVE
Glucose, UA: 50 mg/dL — AB
Hgb urine dipstick: NEGATIVE
Ketones, ur: NEGATIVE mg/dL
Leukocytes,Ua: NEGATIVE
Nitrite: NEGATIVE
Protein, ur: 30 mg/dL — AB
Specific Gravity, Urine: 1.015 (ref 1.005–1.030)
pH: 5 (ref 5.0–8.0)

## 2019-03-19 LAB — GLUCOSE, CAPILLARY
Glucose-Capillary: 187 mg/dL — ABNORMAL HIGH (ref 70–99)
Glucose-Capillary: 219 mg/dL — ABNORMAL HIGH (ref 70–99)
Glucose-Capillary: 254 mg/dL — ABNORMAL HIGH (ref 70–99)
Glucose-Capillary: 222 mg/dL — ABNORMAL HIGH (ref 70–99)

## 2019-03-19 LAB — COMPREHENSIVE METABOLIC PANEL
ALT: 46 U/L — ABNORMAL HIGH (ref 0–44)
AST: 25 U/L (ref 15–41)
Albumin: 2.4 g/dL — ABNORMAL LOW (ref 3.5–5.0)
Alkaline Phosphatase: 55 U/L (ref 38–126)
Anion gap: 9 (ref 5–15)
BUN: 57 mg/dL — ABNORMAL HIGH (ref 8–23)
CO2: 19 mmol/L — ABNORMAL LOW (ref 22–32)
Calcium: 8.1 mg/dL — ABNORMAL LOW (ref 8.9–10.3)
Chloride: 105 mmol/L (ref 98–111)
Creatinine, Ser: 2.39 mg/dL — ABNORMAL HIGH (ref 0.61–1.24)
GFR calc Af Amer: 27 mL/min — ABNORMAL LOW (ref >60)
GFR calc non Af Amer: 24 mL/min — ABNORMAL LOW (ref >60)
Glucose, Bld: 216 mg/dL — ABNORMAL HIGH (ref 70–99)
Potassium: 5.3 mmol/L — ABNORMAL HIGH (ref 3.5–5.1)
Sodium: 133 mmol/L — ABNORMAL LOW (ref 135–145)
Total Bilirubin: 0.8 mg/dL (ref 0.3–1.2)
Total Protein: 5.6 g/dL — ABNORMAL LOW (ref 6.5–8.1)

## 2019-03-19 LAB — HEMOGLOBIN AND HEMATOCRIT, BLOOD
HCT: 25.3 % — ABNORMAL LOW (ref 39.0–52.0)
Hemoglobin: 8.4 g/dL — ABNORMAL LOW (ref 13.0–17.0)

## 2019-03-19 LAB — PROCALCITONIN: Procalcitonin: 2.8 ng/mL

## 2019-03-19 LAB — PHOSPHORUS: Phosphorus: 2.8 mg/dL (ref 2.5–4.6)

## 2019-03-19 LAB — D-DIMER, QUANTITATIVE: D-Dimer, Quant: 2.22 ug/mL-FEU — ABNORMAL HIGH (ref 0.00–0.50)

## 2019-03-19 LAB — FERRITIN: Ferritin: 423 ng/mL — ABNORMAL HIGH (ref 24–336)

## 2019-03-19 LAB — C-REACTIVE PROTEIN: CRP: 12.5 mg/dL — ABNORMAL HIGH (ref <1.0)

## 2019-03-19 LAB — MAGNESIUM: Magnesium: 2.4 mg/dL (ref 1.7–2.4)

## 2019-03-19 MED ORDER — SODIUM ZIRCONIUM CYCLOSILICATE 10 G PO PACK
10.0000 g | PACK | Freq: Once | ORAL | Status: AC
  Administered 2019-03-19: 12:00:00 10 g via ORAL
  Filled 2019-03-19: qty 1

## 2019-03-19 NOTE — Progress Notes (Signed)
PROGRESS NOTE    Nicholas Swanson  YIF:[id number] DOB: 02/03/1931 DOA: 03/08/2019 PCP: Iona Beard, MD  Brief Narrative: 86 year old gentleman former smoker, history of COPD, type 2 diabetes, hypertension, hyperlipidemia, stage III CKD was diagnosed with COVID-19 on 03/04/2019, returned to hospital with shortness of breath, cough and fatigue with generalized weakness.  Patient is poor historian.  Also had nausea and vomiting.  He was very lethargic at home as per patient's wife. In the emergency room 92% on room air, chest x-ray shows bilateral pneumonia left more than right.  Creatinine was 3.38.  Potassium 5.4.  Procalcitonin more than 150.  Lactic acid 2.7.  Started on steroid and remdesivir and admitted to [facility].  Assessment & Plan:   Principal Problem:   Pneumonia due to COVID-19 virus Active Problems:   Essential hypertension   Acute respiratory disease due to COVID-19 virus   Type 2 diabetes mellitus with stage 3 chronic kidney disease (HCC)   Elevated LFTs   AKI (acute kidney injury) on CKD III   Pneumonia due to COVID-19 virus  Possible Superimposed Bacterial Pneumonia: Continue to monitor due to significant symptoms on presentation.  Oxygenation improved and mostly remains on room air. Continue steroids, remdesivir completed  On abx for CAP, procalcitonin >150 on presentation - repeat 12/31 <0.10.  On 1/1 procalcitonin was 6.77, unclear significance of this.  His CRP rose to 15 on 1/2.  His procalcitonin also jumped.  CRP down some today. Fever on 1/2 to 100.5 Repeat blood cultures, follow urinalysis CXR 03/18/19 with patchy airspace opacities within R mid to lower lung field and at the L lung base Will start augmentin for possible superimposed bacterial pneumonia Follow repeat cultures I/O, daily weights IS, OOB, prone as able Daily inflammatory labs Blood cx NGTD x 5  COVID-19 Labs  Recent Labs    03/17/19 0955 03/18/19 0420 03/19/19 0500  DDIMER  1.74* 1.90* 2.22*  FERRITIN 309 345* 423*  CRP 10.6* 15.0* 12.5*    No results found for: SARSCOV2NAA  Acute on chronic renal failure: Stage IIIa at baseline. Creatinine peaked at 3.38 Baseline creatinine unknown Relatively stable today Continue to monitor and follow  Hyperkalemia: will give lokelma again and follow  Hypertension: Blood pressure stable.  On amlodipine.  Holding valsartan and hydrochlorothiazide due to AKI.  Type 2 diabetes with hyperglycemia: Aggravated by steroid use. SSI and mealtime insulin.  Elevated LFTs: Normal liver ultrasound.  Hepatitis panel negative.  Probably due to acute COVID-19 infection.  Trending down.  We will continue monitoring.  DVT prophylaxis: lovenox Code Status: full  Family Communication: none at bedside - wife Disposition Plan: pending further improvement, discharge on hold with worsening inflammatory markers and recurrent fever - awaiting culture results, improvement in potassium  Consultants:   none  Procedures:   none  Antimicrobials:  Anti-infectives (From admission, onward)   Start     Dose/Rate Route Frequency Ordered Stop   03/18/19 1700  amoxicillin-clavulanate (AUGMENTIN) 500-125 MG per tablet 500 mg     1 tablet Oral 2 times daily 03/18/19 1621 03/23/19 2159   03/12/19 1000  remdesivir 100 mg in sodium chloride 0.9 % 100 mL IVPB     100 mg 200 mL/hr over 30 Minutes Intravenous Daily 03/15/2019 1855 03/15/19 0958   02/28/2019 2100  cefTRIAXone (ROCEPHIN) 1 g in sodium chloride 0.9 % 100 mL IVPB     1 g 200 mL/hr over 30 Minutes Intravenous Every 24 hours 03/09/2019 2057 03/15/19 2055  03/08/2019 2100  azithromycin (ZITHROMAX) 500 mg in sodium chloride 0.9 % 250 mL IVPB     500 mg 250 mL/hr over 60 Minutes Intravenous Every 24 hours 03/08/2019 2057 03/15/19 2229   02/28/2019 1900  remdesivir 200 mg in sodium chloride 0.9% 250 mL IVPB     200 mg 580 mL/hr over 30 Minutes Intravenous Once 03/06/2019 1855 02/16/2019 2015      Subjective: Asking when he'll go home  Objective: Vitals:   03/18/19 2000 03/19/19 0417 03/19/19 0739 03/19/19 1652  BP: (!) 145/60 (!) 128/56 125/60 132/66  Pulse: 79 79 69 63  Resp: 18 17 16 16   Temp: 99.7 F (37.6 C) 99 F (37.2 C) 99.2 F (37.3 C) 98.8 F (37.1 C)  TempSrc: Oral Oral Oral Oral  SpO2: 93% 96% 92% 95%  Weight:      Height:        Intake/Output Summary (Last 24 hours) at 03/19/2019 1900 Last data filed at 03/19/2019 1700 Gross per 24 hour  Intake 480 ml  Output 925 ml  Net -445 ml   Filed Weights   03/16/19 0410 03/17/19 0421 03/18/19 0418  Weight: 76.1 kg 76.4 kg 77.1 kg    Examination:  General: No acute distress. Cardiovascular: RRR Lungs: Clear to auscultation bilaterally  Abdomen: Soft, nontender, nondistended  Neurological: Alert and oriented 3. Moves all extremities 4. Cranial nerves II through XII grossly intact. Skin: Warm and dry. No rashes or lesions. Extremities: No clubbing or cyanosis. No edema.   Data Reviewed: I have personally reviewed following labs and imaging studies  CBC: Recent Labs  Lab 03/15/19 0430 03/16/19 0607 03/17/19 0955 03/18/19 0420 03/19/19 0500 03/19/19 0900  WBC 6.5 8.1 9.7 7.8 6.2  --   NEUTROABS 5.6 6.9 7.2 5.3 4.3  --   HGB 9.4* 8.9* 9.5* 9.6* 8.3* 8.4*  HCT 28.5* 27.0* 29.0* 29.1* 25.2* 25.3*  MCV 88.5 87.9 88.7 89.0 88.1  --   PLT 418* 450* 506* 484* 480*  --    Basic Metabolic Panel: Recent Labs  Lab 03/15/19 0430 03/16/19 0607 03/17/19 0955 03/18/19 0420 03/19/19 0500 03/19/19 1600  NA 135 136 136 133* 133* 132*  K 5.5* 5.2* 4.7 4.6 5.3* 5.8*  CL 108 108 108 103 105 106  CO2 17* 18* 17* 19* 19* 18*  GLUCOSE 210* 204* 211* 157* 216* 270*  BUN 84* 78* 61* 53* 57* 56*  CREATININE 2.49* 2.45* 2.38* 2.34* 2.39* 2.40*  CALCIUM 8.1* 8.2* 8.3* 8.1* 8.1* 8.1*  MG 2.8* 2.8* 2.2 2.3 2.4  --   PHOS 3.3 3.2 2.0* 2.1* 2.8  --    GFR: Estimated Creatinine Clearance: 24.1 mL/min (A) (by C-G  formula based on SCr of 2.4 mg/dL (H)). Liver Function Tests: Recent Labs  Lab 03/15/19 0430 03/16/19 0607 03/17/19 0955 03/18/19 0420 03/19/19 0500  AST 30 26 32 26 25  ALT 69* 58* 58* 52* 46*  ALKPHOS 60 54 53 57 55  BILITOT 0.4 0.9 0.5 0.8 0.8  PROT 5.9* 5.7* 6.0* 5.9* 5.6*  ALBUMIN 2.5* 2.6* 2.4* 2.6* 2.4*   No results for input(s): LIPASE, AMYLASE in the last 168 hours. No results for input(s): AMMONIA in the last 168 hours. Coagulation Profile: No results for input(s): INR, PROTIME in the last 168 hours. Cardiac Enzymes: No results for input(s): CKTOTAL, CKMB, CKMBINDEX, TROPONINI in the last 168 hours. BNP (last 3 results) No results for input(s): PROBNP in the last 8760 hours. HbA1C: No results for input(s): HGBA1C  in the last 72 hours. CBG: Recent Labs  Lab 03/18/19 1145 03/18/19 2109 03/19/19 0719 03/19/19 1152 03/19/19 1651  GLUCAP 178* 326* 187* 219* 254*   Lipid Profile: No results for input(s): CHOL, HDL, LDLCALC, TRIG, CHOLHDL, LDLDIRECT in the last 72 hours. Thyroid Function Tests: No results for input(s): TSH, T4TOTAL, FREET4, T3FREE, THYROIDAB in the last 72 hours. Anemia Panel: Recent Labs    03/18/19 0420 03/19/19 0500  FERRITIN 345* 423*   Sepsis Labs: Recent Labs  Lab 03/16/19 0607 03/17/19 0955 03/18/19 1010 03/19/19 0900  PROCALCITON <0.10 6.77 4.14 2.80    Recent Results (from the past 240 hour(s))  Blood Culture (routine x 2)     Status: None   Collection Time: 02/28/2019  2:45 PM   Specimen: Right Antecubital; Blood  Result Value Ref Range Status   Specimen Description   Final    RIGHT ANTECUBITAL BOTTLES DRAWN AEROBIC AND ANAEROBIC   Special Requests Blood Culture adequate volume  Final   Culture   Final    NO GROWTH 5 DAYS Performed at [facility], [address], [place], [place] [address]    Report Status 03/16/2019 FINAL  Final  Blood Culture (routine x 2)     Status: None   Collection Time: 03/09/2019  2:59 PM    Specimen: BLOOD RIGHT WRIST  Result Value Ref Range Status   Specimen Description   Final    BLOOD RIGHT WRIST BOTTLES DRAWN AEROBIC AND ANAEROBIC   Special Requests Blood Culture adequate volume  Final   Culture   Final    NO GROWTH 5 DAYS Performed at [facility], [address], [place], [place] [address]    Report Status 03/16/2019 FINAL  Final  Culture, blood (routine x 2)     Status: None (Preliminary result)   Collection Time: 03/18/19  7:40 PM   Specimen: BLOOD LEFT HAND  Result Value Ref Range Status   Specimen Description   Final    BLOOD LEFT HAND Performed at [facility], [place] [address] [place], [place] [address]    Special Requests   Final    BOTTLES DRAWN AEROBIC AND ANAEROBIC Blood Culture adequate volume Performed at [facility] [address], [place], [place] [address]    Culture   Final    NO GROWTH < 12 HOURS Performed at [facility] [address], [place], [place] [address]    Report Status PENDING  Incomplete  Culture, blood (routine x 2)     Status: None (Preliminary result)   Collection Time: 03/18/19  7:45 PM   Specimen: BLOOD  Result Value Ref Range Status   Specimen Description   Final    BLOOD RIGHT ANTECUBITAL Performed at [facility] [address] [place], [place] [address]    Special Requests   Final    BOTTLES DRAWN AEROBIC AND ANAEROBIC Blood Culture adequate volume Performed at [facility] [address], [place], [place] [address]    Culture   Final    NO GROWTH < 12 HOURS Performed at [facility] [address], [place],  [address]    Report Status PENDING  Incomplete         Radiology Studies: DG CHEST PORT 1 VIEW  Result Date: 03/18/2019 CLINICAL DATA:  Hypoxia EXAM: PORTABLE CHEST 1 VIEW COMPARISON:  Chest radiographs 02/23/2019 and earlier FINDINGS: Heart size within normal limits. Again demonstrated are patchy  airspace opacities  within the right mid to lower lung field and left lung base. Findings are more conspicuous on the right as compared to examination 03/13/2019. No evidence of pleural effusion or pneumothorax. IMPRESSION: Redemonstrated patchy airspace opacities within the right mid to lower lung field and at the left lung base. Findings have increased in conspicuity on the right as compared to prior examination 03/16/2019. Electronically Signed   By: Kellie Simmering DO   On: 03/18/2019 10:15        Scheduled Meds: . albuterol  2 puff Inhalation Q6H  . amLODipine  5 mg Oral Daily  . amoxicillin-clavulanate  1 tablet Oral BID  . vitamin C  500 mg Oral Daily  . aspirin EC  81 mg Oral Daily  . atorvastatin  40 mg Oral Daily  . dexamethasone (DECADRON) injection  6 mg Intravenous Q24H  . enoxaparin (LOVENOX) injection  30 mg Subcutaneous Q24H  . folic acid  1 mg Oral Daily  . guaiFENesin  600 mg Oral BID  . insulin aspart  0-9 Units Subcutaneous TID WC  . metoprolol succinate  50 mg Oral Daily  . multivitamin with minerals  1 tablet Oral Daily  . sodium chloride flush  3 mL Intravenous Q12H  . terazosin  5 mg Oral QHS  . thiamine  100 mg Oral Daily  . zinc sulfate  220 mg Oral Daily   Continuous Infusions: . sodium chloride 250 mL (03/12/19 2046)     LOS: 8 days    Time spent: over 30 min    Fayrene Helper, MD [facility] Pager AMION  If 7PM-7AM, please contact night-coverage [email or web address] Password [email or web address] 03/19/2019, 7:00 PM

## 2019-03-20 ENCOUNTER — Inpatient Hospital Stay (HOSPITAL_COMMUNITY): Payer: Medicare Other

## 2019-03-20 DIAGNOSIS — R791 Abnormal coagulation profile: Secondary | ICD-10-CM

## 2019-03-20 LAB — IRON AND TIBC
Iron: 79 ug/dL (ref 45–182)
Saturation Ratios: 49 % — ABNORMAL HIGH (ref 17.9–39.5)
TIBC: 162 ug/dL — ABNORMAL LOW (ref 250–450)
UIBC: 83 ug/dL

## 2019-03-20 LAB — COMPREHENSIVE METABOLIC PANEL
ALT: 42 U/L (ref 0–44)
AST: 22 U/L (ref 15–41)
Albumin: 2.5 g/dL — ABNORMAL LOW (ref 3.5–5.0)
Alkaline Phosphatase: 55 U/L (ref 38–126)
Anion gap: 8 (ref 5–15)
BUN: 59 mg/dL — ABNORMAL HIGH (ref 8–23)
CO2: 18 mmol/L — ABNORMAL LOW (ref 22–32)
Calcium: 8.1 mg/dL — ABNORMAL LOW (ref 8.9–10.3)
Chloride: 106 mmol/L (ref 98–111)
Creatinine, Ser: 2.38 mg/dL — ABNORMAL HIGH (ref 0.61–1.24)
GFR calc Af Amer: 28 mL/min — ABNORMAL LOW (ref >60)
GFR calc non Af Amer: 24 mL/min — ABNORMAL LOW (ref >60)
Glucose, Bld: 175 mg/dL — ABNORMAL HIGH (ref 70–99)
Potassium: 5.5 mmol/L — ABNORMAL HIGH (ref 3.5–5.1)
Sodium: 132 mmol/L — ABNORMAL LOW (ref 135–145)
Total Bilirubin: 0.8 mg/dL (ref 0.3–1.2)
Total Protein: 5.6 g/dL — ABNORMAL LOW (ref 6.5–8.1)

## 2019-03-20 LAB — CBC WITH DIFFERENTIAL/PLATELET
Abs Immature Granulocytes: 0.13 10*3/uL — ABNORMAL HIGH (ref 0.00–0.07)
Basophils Absolute: 0 10*3/uL (ref 0.0–0.1)
Basophils Relative: 0 %
Eosinophils Absolute: 0 10*3/uL (ref 0.0–0.5)
Eosinophils Relative: 0 %
HCT: 24.1 % — ABNORMAL LOW (ref 39.0–52.0)
Hemoglobin: 8 g/dL — ABNORMAL LOW (ref 13.0–17.0)
Immature Granulocytes: 2 %
Lymphocytes Relative: 11 %
Lymphs Abs: 0.8 10*3/uL (ref 0.7–4.0)
MCH: 29.2 pg (ref 26.0–34.0)
MCHC: 33.2 g/dL (ref 30.0–36.0)
MCV: 88 fL (ref 80.0–100.0)
Monocytes Absolute: 1.5 10*3/uL — ABNORMAL HIGH (ref 0.1–1.0)
Monocytes Relative: 21 %
Neutro Abs: 4.7 10*3/uL (ref 1.7–7.7)
Neutrophils Relative %: 66 %
Platelets: 533 10*3/uL — ABNORMAL HIGH (ref 150–400)
RBC: 2.74 MIL/uL — ABNORMAL LOW (ref 4.22–5.81)
RDW: 14.7 % (ref 11.5–15.5)
WBC: 7.1 10*3/uL (ref 4.0–10.5)
nRBC: 0 % (ref 0.0–0.2)

## 2019-03-20 LAB — PROCALCITONIN: Procalcitonin: 1.76 ng/mL

## 2019-03-20 LAB — C-REACTIVE PROTEIN: CRP: 5.6 mg/dL — ABNORMAL HIGH (ref <1.0)

## 2019-03-20 LAB — POTASSIUM: Potassium: 5.7 mmol/L — ABNORMAL HIGH (ref 3.5–5.1)

## 2019-03-20 LAB — GLUCOSE, CAPILLARY
Glucose-Capillary: 58 mg/dL — ABNORMAL LOW (ref 70–99)
Glucose-Capillary: 248 mg/dL — ABNORMAL HIGH (ref 70–99)
Glucose-Capillary: 155 mg/dL — ABNORMAL HIGH (ref 70–99)
Glucose-Capillary: 224 mg/dL — ABNORMAL HIGH (ref 70–99)
Glucose-Capillary: 158 mg/dL — ABNORMAL HIGH (ref 70–99)
Glucose-Capillary: 240 mg/dL — ABNORMAL HIGH (ref 70–99)

## 2019-03-20 LAB — VITAMIN B12: Vitamin B-12: 732 pg/mL (ref 180–914)

## 2019-03-20 LAB — D-DIMER, QUANTITATIVE: D-Dimer, Quant: 2.27 ug/mL-FEU — ABNORMAL HIGH (ref 0.00–0.50)

## 2019-03-20 LAB — PHOSPHORUS: Phosphorus: 2.5 mg/dL (ref 2.5–4.6)

## 2019-03-20 LAB — MAGNESIUM: Magnesium: 2.5 mg/dL — ABNORMAL HIGH (ref 1.7–2.4)

## 2019-03-20 LAB — FOLATE: Folate: 19.8 ng/mL (ref >5.9)

## 2019-03-20 LAB — FERRITIN: Ferritin: 470 ng/mL — ABNORMAL HIGH (ref 24–336)

## 2019-03-20 MED ORDER — AMOXICILLIN-POT CLAVULANATE 500-125 MG PO TABS: 1.0000 | ORAL_TABLET | Freq: Two times a day (BID) | ORAL | 0 refills | Status: AC

## 2019-03-20 MED ORDER — METOPROLOL SUCCINATE ER 50 MG PO TB24: 50.0000 mg | ORAL_TABLET | Freq: Every day | ORAL | 0 refills | Status: DC

## 2019-03-20 MED ORDER — SODIUM ZIRCONIUM CYCLOSILICATE 10 G PO PACK
10.0000 g | PACK | Freq: Every day | ORAL | Status: DC
  Administered 2019-03-20: 13:00:00 10 g via ORAL
  Filled 2019-03-20: qty 1

## 2019-03-20 MED ORDER — ALBUTEROL SULFATE (2.5 MG/3ML) 0.083% IN NEBU: 2.5000 mg | INHALATION_SOLUTION | Freq: Four times a day (QID) | RESPIRATORY_TRACT | 0 refills | Status: DC | PRN

## 2019-03-20 MED ORDER — ATORVASTATIN CALCIUM 40 MG PO TABS: 40.0000 mg | ORAL_TABLET | Freq: Every day | ORAL | 0 refills | Status: AC

## 2019-03-20 MED ORDER — SODIUM POLYSTYRENE SULFONATE PO POWD: ORAL | 0 refills | Status: DC

## 2019-03-20 NOTE — Progress Notes (Signed)
Lower extremity venous has been completed.   Preliminary results in CV Proc.   Abram Sander 03/20/2019 12:41 PM

## 2019-03-20 NOTE — Progress Notes (Signed)
   03/20/19 1400  Family/Significant Other Communication  Family/Significant Other Update Called (wife called updated a second time [place] )

## 2019-03-20 NOTE — Evaluation (Signed)
Clinical/Bedside Swallow Evaluation Patient Details  Name: Nicholas Swanson MRN: [id number] Date of Birth: 08-17-1930  Today's Date: 03/20/2019 Time: SLP Start Time (ACUTE ONLY): 53 SLP Stop Time (ACUTE ONLY): 1412 SLP Time Calculation (min) (ACUTE ONLY): 10 min  Past Medical History:  Past Medical History:  Diagnosis Date  . COPD (chronic obstructive pulmonary disease) ([place])   . Diabetes mellitus   . Hypertension    Past Surgical History: History reviewed. No pertinent surgical history. HPI:  86 year old gentleman former smoker, history of COPD, type 2 diabetes, hypertension, hyperlipidemia, stage III CKD was diagnosed with COVID-19 on 03/04/2019, returned to hospital 12/26 with shortness of breath, cough and fatigue with generalized weakness. Dx COVID pna, possible superimposed bacterial pna, AKI,    Assessment / Plan / Recommendation Clinical Impression  Pt presents with functional oropharyngeal swallow with active mastication despite absence of teeth, the appearance of a brisk swallow response, adequate coordination of swallow/respiratory cycles, and no overt s/s of aspiration.  Recommend maintaining regular diet, thin liquids to allow best food choices.  No dysphagia identified; no SLP f/u is needed.  SLP Visit Diagnosis: Dysphagia, unspecified (R13.10)    Aspiration Risk  No limitations    Diet Recommendation   regular solids, thin liquids  Medication Administration: Whole meds with liquid    Other  Recommendations Oral Care Recommendations: Oral care BID   Follow up Recommendations None      Frequency and Duration            Prognosis        Swallow Study   General Date of Onset: 02/22/2019 HPI: 86 year old gentleman former smoker, history of COPD, type 2 diabetes, hypertension, hyperlipidemia, stage III CKD was diagnosed with COVID-19 on 03/04/2019, returned to hospital 12/26 with shortness of breath, cough and fatigue with generalized weakness. Dx COVID pna,  possible superimposed bacterial pna, AKI,  Type of Study: Bedside Swallow Evaluation Previous Swallow Assessment: no Diet Prior to this Study: Regular;Thin liquids Temperature Spikes Noted: No History of Recent Intubation: No Behavior/Cognition: Alert;Cooperative;Pleasant mood Oral Cavity Assessment: Within Functional Limits Oral Care Completed by SLP: No Oral Cavity - Dentition: Edentulous Vision: Functional for self-feeding Self-Feeding Abilities: Able to feed self Patient Positioning: Upright in bed Baseline Vocal Quality: Normal Volitional Cough: Strong Volitional Swallow: Able to elicit    Oral/Motor/Sensory Function Overall Oral Motor/Sensory Function: Within functional limits   Ice Chips Ice chips: Within functional limits   Thin Liquid Thin Liquid: Within functional limits    Nectar Thick Nectar Thick Liquid: Not tested   Honey Thick Honey Thick Liquid: Not tested   Puree Puree: Within functional limits   Solid     Solid: Within functional limits      Nicholas Swanson Nicholas Swanson 03/20/2019,2:19 PM   Nicholas Swanson L. Nicholas Swanson, [place] Office number [phone]

## 2019-03-20 NOTE — Progress Notes (Signed)
   03/20/19 1136  Family/Significant Other Communication  Family/Significant Other Update Called;Updated (wife  )

## 2019-03-20 NOTE — Care Management Important Message (Signed)
Important Message  Patient Details  Name: Nicholas Swanson MRN: [id number] Date of Birth: 1930-11-30   Medicare Important Message Given:  Yes - Important Message mailed due to current National Emergency  Verbal consent obtained due to current National Emergency  Relationship to patient: Spouse/Significant Other Contact Name: Jahki Witham Call Date: 03/20/19  Time: 1347 Phone: [phone] Outcome: Spoke with contact Important Message mailed to: Patient address on file    Delorse Lek 03/20/2019, 1:48 PM

## 2019-03-20 NOTE — Discharge Summary (Signed)
Physician Discharge Summary  Nicholas Swanson TGY:[id number] DOB: September 02, 1930 DOA: 02/15/2019  PCP: Nicholas Beard, MD  Admit date: 02/28/2019 Discharge date: 03/20/2019  Time spent: 40 minutes  Recommendations for Outpatient Follow-up:  1. Follow outpatient CBC/CMP 2. Continue to quarantine per CDC guidelines 3. Follow up with nephrology outpatient  4. Follow potassium outpatient, discharged with kayexalate daily x 7 days 5. Discharged with augmentin x3 days 6. Follow chest x ray outpatient  7. Follow anemia of chronic disease outpatient 8. Follow repeat blood cx  Discharge Diagnoses:  Principal Problem:   Pneumonia due to COVID-19 virus Active Problems:   Essential hypertension   Acute respiratory disease due to COVID-19 virus   Type 2 diabetes mellitus with stage 3 chronic kidney disease (HCC)   Elevated LFTs   AKI (acute kidney injury) on CKD III   Discharge Condition: stable  Filed Weights   03/16/19 0410 03/17/19 0421 03/18/19 0418  Weight: 76.1 kg 76.4 kg 77.1 kg    History of present illness:  86 year old gentleman former smoker, history of COPD, type 2 diabetes, hypertension, hyperlipidemia, stage III CKD was diagnosed with COVID-19 on 03/04/2019, returnedto hospital with shortness of breath, cough and fatigue with generalized weakness. Patient is poor historian. Also had nausea and vomiting. He was very lethargic at home as per patient's wife. In the emergency room 92% on room air, chest x-ray shows bilateral pneumonia left more than right. Creatinine was 3.38. Potassium 5.4. Procalcitonin more than 150. Lactic acid 2.7. Started on steroid and remdesivir and admitted to [facility].  He was treated for COVID 19 pneumonia.  He improved with steroids and remdesivir and antibiotics in the setting of elevated procalcitonin.  His antibiotics were restarted due to rising inflammatory markers prior to discharge.  Hospitalization was c/b AKI and hyperkalemia.  He  was discharged with kayexalate and recommendation for renal follow up.    See below for further details  Hospital Course:  Pneumonia due to COVID-19 virus  Possible Superimposed Bacterial Pneumonia: Continue to monitor due to significant symptoms on presentation.Oxygenation improved and mostly remains on room air. Continue steroids, remdesivir completed  On abx for CAP, procalcitonin >150 on presentation - repeat 12/31 <0.10.  On 1/1 procalcitonin was 6.77, unclear significance of this.  His CRP rose to 15 on 1/2.  His procalcitonin also jumped.  CRP downtrending. Fever on 1/2 to 100.5 Repeat blood cultures, follow urinalysis CXR 03/18/19 with patchy airspace opacities within R mid to lower lung field and at the L lung base Discharged with augmentin for possible bacterial pneumonia Follow repeat cultures (NGTDx1) I/O, daily weights IS, OOB, prone as able Daily inflammatory labs Blood cx NGTD x 5  COVID-19 Labs  Recent Labs    03/18/19 0420 03/19/19 0500 03/20/19 0548  DDIMER 1.90* 2.22* 2.27*  FERRITIN 345* 423* 470*  CRP 15.0* 12.5* 5.6*    No results found for: SARSCOV2NAA  Acute on chronic renal failure: Stage IIIa at baseline. Creatinine peaked at 3.38 Baseline creatinine unknown Relatively stable today Continue to monitor and follow Recommended outpatient renal follow up  Hyperkalemia: Lokelma prohibitively expesnive, discharged with kayexalate x 7 days and recommended follow up labs after   Hypertension: Blood pressure stable.On amlodipine. Holding valsartan and hydrochlorothiazide due to AKI.  Type 2 diabetes with hyperglycemia:Aggravated bysteroid use. SSI and mealtime insulin.  Elevated LFTs: Normal liver ultrasound.Hepatitis panel negative.Probably due to acute COVID-19 infection. Trending down. We will continue monitoring.  Anemia of chronic Disease: likely related to renal dz.  Follow outpatient  Procedures: LE [place] Summary: Right: There  is no evidence of deep vein thrombosis in the lower extremity. No cystic structure found in the popliteal fossa. Left: There is no evidence of deep vein thrombosis in the lower extremity. No cystic structure found in the popliteal fossa.  Consultations:  none  Discharge Exam: Vitals:   03/20/19 1009 03/20/19 1010  BP: 130/61   Pulse:  70  Resp:    Temp:    SpO2:     Feels well, eager to go home. Discussed with wife over phone  General: No acute distress. Cardiovascular: Heart sounds show Nicholas Swanson regular rate, and rhythm Lungs: Clear to auscultation bilaterally  Abdomen: Soft, nontender, nondistended Neurological: Alert and oriented 3. Moves all extremities 4 . Cranial nerves II through XII grossly intact. Skin: Warm and dry. No rashes or lesions. Extremities: No clubbing or cyanosis. No edema.   Discharge Instructions   Discharge Instructions    Ambulatory referral to Nephrology   Complete by: As directed    Follow up appointment with Nicholas Swanson   Call MD for:  difficulty breathing, headache or visual disturbances   Complete by: As directed    Call MD for:  extreme fatigue   Complete by: As directed    Call MD for:  hives   Complete by: As directed    Call MD for:  persistant dizziness or light-headedness   Complete by: As directed    Call MD for:  persistant nausea and vomiting   Complete by: As directed    Call MD for:  redness, tenderness, or signs of infection (pain, swelling, redness, odor or green/yellow discharge around incision site)   Complete by: As directed    Call MD for:  severe uncontrolled pain   Complete by: As directed    Call MD for:  temperature >100.4   Complete by: As directed    Diet - low sodium heart healthy   Complete by: As directed    Discharge instructions   Complete by: As directed    You were seen for COVID 19.    You've improved with steroids and remdesivir.    Your quarantine will continue until January 9th.  Please call the  health department or PCP if you have questions.  This can be discontinued when your symptoms have improved for 24 hours without the use of medications.   Your kidney function has improved, but you still have high potassium levels.  We will send you to the kidney doctors outpatient.  Please call Dr. Florentina Addison office or [facility] or ask your PCP to help with the referral.  We will send you with kayexalate to take 15 grams daily for 7 days.  You'll need Teruo Stilley follow up basic metabolic panel after this to follow your labs.  We're sending you home with augmentin for another 3 days.   Return for new, recurrent, or worsening symptoms.  Please ask your PCP to request records from this hospitalization so they know what was done and what the next steps will be.   Increase activity slowly   Complete by: As directed    MyChart COVID-19 home monitoring program   Complete by: Mar 20, 2019    Is the patient willing to use the [facility] for home monitoring?: Yes     Allergies as of 03/20/2019      Reactions   Ace Inhibitors Other (See Comments)   REACTION: renal insufficiency   Simvastatin Other (See  Comments)   REACTION: myalgia      Medication List    STOP taking these medications   hydrochlorothiazide 12.5 MG capsule Commonly known as: MICROZIDE   SPS 15 GM/60ML suspension Generic drug: sodium polystyrene Replaced by: sodium polystyrene powder     TAKE these medications   albuterol 108 (90 Base) MCG/ACT inhaler Commonly known as: VENTOLIN HFA Inhale 1-2 puffs into the lungs every 6 (six) hours as needed for wheezing or shortness of breath. What changed: Another medication with the same name was added. Make sure you understand how and when to take each.   albuterol (2.5 MG/3ML) 0.083% nebulizer solution Commonly known as: PROVENTIL Take 3 mLs (2.5 mg total) by nebulization every 6 (six) hours as needed for wheezing or shortness of breath. What changed: You were  already taking Wyndi Northrup medication with the same name, and this prescription was added. Make sure you understand how and when to take each.   amLODipine 10 MG tablet Commonly known as: NORVASC Take 10 mg by mouth daily.   amoxicillin-clavulanate 500-125 MG tablet Commonly known as: AUGMENTIN Take 1 tablet (500 mg total) by mouth 2 (two) times daily for 3 days.   aspirin EC 81 MG tablet Take 81 mg by mouth daily.   atorvastatin 40 MG tablet Commonly known as: LIPITOR Take 1 tablet (40 mg total) by mouth daily.   glipiZIDE 2.5 MG 24 hr tablet Commonly known as: GLUCOTROL XL Take 2.5 mg by mouth daily.   metFORMIN 1000 MG tablet Commonly known as: GLUCOPHAGE Take 1,000 mg by mouth 2 (two) times daily with Miron Marxen meal.   metoprolol succinate 50 MG 24 hr tablet Commonly known as: TOPROL-XL Take 1 tablet (50 mg total) by mouth daily. Start taking on: March 21, 2019   sodium polystyrene powder Commonly known as: KAYEXALATE Take 15 grams daily for 7 days.  Please follow up with your PCP for repeat labs once this is complete. Replaces: SPS 15 GM/60ML suspension   terazosin 5 MG capsule Commonly known as: HYTRIN Take 5 mg by mouth at bedtime.            Durable Medical Equipment  (From admission, onward)         Start     Ordered   03/17/19 1341  For home use only DME Nebulizer machine  Once    Question Answer Comment  Patient needs Latravion Graves nebulizer to treat with the following condition COPD (chronic obstructive pulmonary disease) ([place])   Length of Need Lifetime      03/17/19 1341         Allergies  Allergen Reactions  . Ace Inhibitors Other (See Comments)    REACTION: renal insufficiency  . Simvastatin Other (See Comments)    REACTION: myalgia   Follow-up Information    [facility].   Specialty: Home Health Services Why: [facility] will reach out to schedule PT and OT services.        Nicholas Beard, MD Follow up.   Specialty:  Family Medicine Why: Call for follow up appointment and labs when your quarantine has ended Ask about renal referral (kidney doctors) Contact information: [address] [place] [address] [phone]        Fran Lowes, MD Follow up.   Specialty: Nephrology Why: Call to schedule an appointment with Dr. Aura Fey information: [address] [place] [address] [phone]        [facility] [place]  Follow up.   Why: If you can't follow up with Dr. Cristela Felt, consider following up with [facility] for your kidneys Contact information: [place] [address] [phone]            The results of significant diagnostics from this hospitalization (including imaging, microbiology, ancillary and laboratory) are listed below for reference.    Significant Diagnostic Studies: DG CHEST PORT 1 VIEW  Result Date: 03/18/2019 CLINICAL DATA:  Hypoxia EXAM: PORTABLE CHEST 1 VIEW COMPARISON:  Chest radiographs 02/23/2019 and earlier FINDINGS: Heart size within normal limits. Again demonstrated are patchy airspace opacities within the right mid to lower lung field and left lung base. Findings are more conspicuous on the right as compared to examination 03/10/2019. No evidence of pleural effusion or pneumothorax. IMPRESSION: Redemonstrated patchy airspace opacities within the right mid to lower lung field and at the left lung base. Findings have increased in conspicuity on the right as compared to prior examination 02/23/2019. Electronically Signed   By: Kellie Simmering DO   On: 03/18/2019 10:15   DG Chest Port 1 View  Result Date: 02/28/2019 CLINICAL DATA:  86 year old male with progressive shortness of breath for the past 2 days. Diagnosed with COVID 1 week ago. EXAM: PORTABLE CHEST 1 VIEW COMPARISON:  Prior chest x-ray 03/04/2019 FINDINGS: Developing multifocal interstitial and airspace opacities most confluent in the periphery of the  left lower lung. Changes are superimposed on Nial Hawe background of emphysema and chronic bronchitic changes. Cardiac and mediastinal contours are within normal limits. No acute osseous abnormality. IMPRESSION: 1. Developing bilateral patchy airspace opacities worse on the left than the right. Findings are concerning for progression 2 multifocal COVID pneumonia. 2. Background emphysema and chronic bronchitic changes. Electronically Signed   By: Jacqulynn Cadet M.D.   On: 03/10/2019 14:42   DG Chest Portable 1 View  Result Date: 03/04/2019 CLINICAL DATA:  Cough fever chills. EXAM: PORTABLE CHEST 1 VIEW COMPARISON:  04/12/2011 FINDINGS: Cardiomediastinal contours are stable. Linear opacities are seen at the lung bases. Lungs are otherwise clear. No signs of pleural effusion or dense consolidation. No acute bone finding. IMPRESSION: Basilar atelectasis without acute cardiopulmonary disease. Electronically Signed   By: Zetta Bills M.D.   On: 03/04/2019 12:42   VAS [place] LOWER EXTREMITY VENOUS (DVT)  Result Date: 03/20/2019  Lower Venous Study Indications: D dimer.  Comparison Study: no prior Performing Technologist: Abram Sander RVS  Examination Guidelines: Uzoma Vivona complete evaluation includes B-mode imaging, spectral Doppler, color Doppler, and power Doppler as needed of all accessible portions of each vessel. Bilateral testing is considered an integral part of Alyxis Grippi complete examination. Limited examinations for reoccurring indications may be performed as noted.  +---------+---------------+---------+-----------+----------+--------------+ RIGHT    CompressibilityPhasicitySpontaneityPropertiesThrombus Aging +---------+---------------+---------+-----------+----------+--------------+ CFV      Full           Yes      Yes                                 +---------+---------------+---------+-----------+----------+--------------+ SFJ      Full                                                         +---------+---------------+---------+-----------+----------+--------------+ FV Prox  Full                                                        +---------+---------------+---------+-----------+----------+--------------+  FV Mid   Full                                                        +---------+---------------+---------+-----------+----------+--------------+ FV DistalFull                                                        +---------+---------------+---------+-----------+----------+--------------+ PFV      Full                                                        +---------+---------------+---------+-----------+----------+--------------+ POP      Full           Yes      Yes                                 +---------+---------------+---------+-----------+----------+--------------+ PTV      Full                                                        +---------+---------------+---------+-----------+----------+--------------+ PERO     Full                                                        +---------+---------------+---------+-----------+----------+--------------+   +---------+---------------+---------+-----------+----------+--------------+ LEFT     CompressibilityPhasicitySpontaneityPropertiesThrombus Aging +---------+---------------+---------+-----------+----------+--------------+ CFV      Full           Yes      Yes                                 +---------+---------------+---------+-----------+----------+--------------+ SFJ      Full                                                        +---------+---------------+---------+-----------+----------+--------------+ FV Prox  Full                                                        +---------+---------------+---------+-----------+----------+--------------+ FV Mid   Full                                                         +---------+---------------+---------+-----------+----------+--------------+  FV DistalFull                                                        +---------+---------------+---------+-----------+----------+--------------+ PFV      Full                                                        +---------+---------------+---------+-----------+----------+--------------+ POP      Full           Yes      Yes                                 +---------+---------------+---------+-----------+----------+--------------+ PTV      Full                                                        +---------+---------------+---------+-----------+----------+--------------+ PERO     Full                                                        +---------+---------------+---------+-----------+----------+--------------+     Summary: Right: There is no evidence of deep vein thrombosis in the lower extremity. No cystic structure found in the popliteal fossa. Left: There is no evidence of deep vein thrombosis in the lower extremity. No cystic structure found in the popliteal fossa.  *See table(s) above for measurements and observations. Electronically signed by Harold Barban MD on 03/20/2019 at 3:34:32 PM.    Final    US Abdomen Limited RUQ  Result Date: 03/12/2019 CLINICAL DATA:  86 year old male with elevated LFTs EXAM: ULTRASOUND ABDOMEN LIMITED RIGHT UPPER QUADRANT COMPARISON:  None. FINDINGS: Gallbladder: No gallstones or wall thickening visualized. No sonographic Murphy sign noted by sonographer. Common bile duct: Diameter: Normal for age at 6 mm Liver: No focal lesion identified. Within normal limits in parenchymal echogenicity. Portal vein is patent on color Doppler imaging with normal direction of blood flow towards the liver. Other: Incidental imaging of the right kidney demonstrates increased parenchymal echogenicity. IMPRESSION: 1. Normal sonographic appearance of the liver. 2. Increased parenchymal  echogenicity of the right kidney suggesting underlying medical renal disease. Electronically Signed   By: Jacqulynn Cadet M.D.   On: 03/12/2019 09:16    Microbiology: Recent Results (from the past 240 hour(s))  Blood Culture (routine x 2)     Status: None   Collection Time: 03/09/2019  2:45 PM   Specimen: Right Antecubital; Blood  Result Value Ref Range Status   Specimen Description   Final    RIGHT ANTECUBITAL BOTTLES DRAWN AEROBIC AND ANAEROBIC   Special Requests Blood Culture adequate volume  Final   Culture   Final    NO GROWTH 5 DAYS Performed at [facility], [address], [place], [place] [address]    Report  Status 03/16/2019 FINAL  Final  Blood Culture (routine x 2)     Status: None   Collection Time: 02/21/2019  2:59 PM   Specimen: BLOOD RIGHT WRIST  Result Value Ref Range Status   Specimen Description   Final    BLOOD RIGHT WRIST BOTTLES DRAWN AEROBIC AND ANAEROBIC   Special Requests Blood Culture adequate volume  Final   Culture   Final    NO GROWTH 5 DAYS Performed at [facility], [address], [place], [place] [address]    Report Status 03/16/2019 FINAL  Final  Culture, blood (routine x 2)     Status: None (Preliminary result)   Collection Time: 03/18/19  7:40 PM   Specimen: BLOOD LEFT HAND  Result Value Ref Range Status   Specimen Description   Final    BLOOD LEFT HAND Performed at [facility], [place] [address], [place], [place] [address]    Special Requests   Final    BOTTLES DRAWN AEROBIC AND ANAEROBIC Blood Culture adequate volume Performed at [facility] [address] [place], [place] [address]    Culture   Final    NO GROWTH 1 DAY Performed at [facility], [place] [address] [place], [place] [address]    Report Status PENDING  Incomplete  Culture, blood (routine x 2)     Status: None (Preliminary result)   Collection Time: 03/18/19  7:45 PM   Specimen: BLOOD  Result Value Ref Range Status   Specimen  Description   Final    BLOOD RIGHT ANTECUBITAL Performed at [facility] [address] [place], [place] [address]    Special Requests   Final    BOTTLES DRAWN AEROBIC AND ANAEROBIC Blood Culture adequate volume Performed at [facility] [address], [place], [place] [address]    Culture   Final    NO GROWTH 1 DAY Performed at [facility], [place] [address] [place], [place] [address]    Report Status PENDING  Incomplete     Labs: Basic Metabolic Panel: Recent Labs  Lab 03/16/19 0607 03/17/19 0955 03/18/19 0420 03/19/19 0500 03/19/19 1600 03/19/19 2250 03/20/19 0548  NA 136 136 133* 133* 132*  --  132*  K 5.2* 4.7 4.6 5.3* 5.8* 5.7* 5.5*  CL 108 108 103 105 106  --  106  CO2 18* 17* 19* 19* 18*  --  18*  GLUCOSE 204* 211* 157* 216* 270*  --  175*  BUN 78* 61* 53* 57* 56*  --  59*  CREATININE 2.45* 2.38* 2.34* 2.39* 2.40*  --  2.38*  CALCIUM 8.2* 8.3* 8.1* 8.1* 8.1*  --  8.1*  MG 2.8* 2.2 2.3 2.4  --   --  2.5*  PHOS 3.2 2.0* 2.1* 2.8  --   --  2.5   Liver Function Tests: Recent Labs  Lab 03/16/19 0607 03/17/19 0955 03/18/19 0420 03/19/19 0500 03/20/19 0548  AST 26 32 26 25 22   ALT 58* 58* 52* 46* 42  ALKPHOS 54 53 57 55 55  BILITOT 0.9 0.5 0.8 0.8 0.8  PROT 5.7* 6.0* 5.9* 5.6* 5.6*  ALBUMIN 2.6* 2.4* 2.6* 2.4* 2.5*   No results for input(s): LIPASE, AMYLASE in the last 168 hours. No results for input(s): AMMONIA in the last 168 hours. CBC: Recent Labs  Lab 03/16/19 0607 03/17/19 0955 03/18/19 0420 03/19/19 0500 03/19/19 0900 03/20/19 0548  WBC 8.1 9.7 7.8 6.2  --  7.1  NEUTROABS 6.9 7.2 5.3 4.3  --  4.7  HGB 8.9* 9.5* 9.6* 8.3* 8.4* 8.0*  HCT 27.0* 29.0* 29.1* 25.2* 25.3* 24.1*  MCV 87.9 88.7 89.0 88.1  --  88.0  PLT 450* 506* 484* 480*  --  533*   Cardiac Enzymes: No results for input(s): CKTOTAL, CKMB, CKMBINDEX, TROPONINI in the last 168 hours. BNP: BNP (last 3 results) No results for  input(s): BNP in the last 8760 hours.  ProBNP (last 3 results) No results for input(s): PROBNP in the last 8760 hours.  CBG: Recent Labs  Lab 03/19/19 1152 03/19/19 1651 03/19/19 2017 03/20/19 0820 03/20/19 1154  GLUCAP 219* 254* 222* 158* 240*       Signed:  Fayrene Helper MD.  [facility] 03/20/2019, 7:36 PM

## 2019-03-20 NOTE — Progress Notes (Signed)
reviewed AVS with pt. Answered all questions reviewed AVS with wife over phone.

## 2019-03-21 DIAGNOSIS — E1122 Type 2 diabetes mellitus with diabetic chronic kidney disease: Secondary | ICD-10-CM | POA: Diagnosis not present

## 2019-03-21 DIAGNOSIS — E875 Hyperkalemia: Secondary | ICD-10-CM | POA: Diagnosis not present

## 2019-03-21 DIAGNOSIS — Z9181 History of falling: Secondary | ICD-10-CM | POA: Diagnosis not present

## 2019-03-21 DIAGNOSIS — N179 Acute kidney failure, unspecified: Secondary | ICD-10-CM | POA: Diagnosis not present

## 2019-03-21 DIAGNOSIS — R7989 Other specified abnormal findings of blood chemistry: Secondary | ICD-10-CM | POA: Diagnosis not present

## 2019-03-21 DIAGNOSIS — J449 Chronic obstructive pulmonary disease, unspecified: Secondary | ICD-10-CM | POA: Diagnosis not present

## 2019-03-21 DIAGNOSIS — I10 Essential (primary) hypertension: Secondary | ICD-10-CM | POA: Diagnosis not present

## 2019-03-21 DIAGNOSIS — N1831 Chronic kidney disease, stage 3a: Secondary | ICD-10-CM | POA: Diagnosis not present

## 2019-03-21 DIAGNOSIS — Z87891 Personal history of nicotine dependence: Secondary | ICD-10-CM | POA: Diagnosis not present

## 2019-03-23 DIAGNOSIS — E1122 Type 2 diabetes mellitus with diabetic chronic kidney disease: Secondary | ICD-10-CM | POA: Diagnosis not present

## 2019-03-23 DIAGNOSIS — N1831 Chronic kidney disease, stage 3a: Secondary | ICD-10-CM | POA: Diagnosis not present

## 2019-03-23 DIAGNOSIS — E875 Hyperkalemia: Secondary | ICD-10-CM | POA: Diagnosis not present

## 2019-03-23 DIAGNOSIS — Z9181 History of falling: Secondary | ICD-10-CM | POA: Diagnosis not present

## 2019-03-23 DIAGNOSIS — R7989 Other specified abnormal findings of blood chemistry: Secondary | ICD-10-CM | POA: Diagnosis not present

## 2019-03-23 DIAGNOSIS — J449 Chronic obstructive pulmonary disease, unspecified: Secondary | ICD-10-CM | POA: Diagnosis not present

## 2019-03-23 DIAGNOSIS — Z87891 Personal history of nicotine dependence: Secondary | ICD-10-CM | POA: Diagnosis not present

## 2019-03-23 DIAGNOSIS — I10 Essential (primary) hypertension: Secondary | ICD-10-CM | POA: Diagnosis not present

## 2019-03-23 DIAGNOSIS — N179 Acute kidney failure, unspecified: Secondary | ICD-10-CM | POA: Diagnosis not present

## 2019-03-24 DIAGNOSIS — Z87891 Personal history of nicotine dependence: Secondary | ICD-10-CM | POA: Diagnosis not present

## 2019-03-24 DIAGNOSIS — E1122 Type 2 diabetes mellitus with diabetic chronic kidney disease: Secondary | ICD-10-CM | POA: Diagnosis not present

## 2019-03-24 DIAGNOSIS — R7989 Other specified abnormal findings of blood chemistry: Secondary | ICD-10-CM | POA: Diagnosis not present

## 2019-03-24 DIAGNOSIS — N179 Acute kidney failure, unspecified: Secondary | ICD-10-CM | POA: Diagnosis not present

## 2019-03-24 DIAGNOSIS — E875 Hyperkalemia: Secondary | ICD-10-CM | POA: Diagnosis not present

## 2019-03-24 DIAGNOSIS — N1831 Chronic kidney disease, stage 3a: Secondary | ICD-10-CM | POA: Diagnosis not present

## 2019-03-24 DIAGNOSIS — I10 Essential (primary) hypertension: Secondary | ICD-10-CM | POA: Diagnosis not present

## 2019-03-24 DIAGNOSIS — J449 Chronic obstructive pulmonary disease, unspecified: Secondary | ICD-10-CM | POA: Diagnosis not present

## 2019-03-24 DIAGNOSIS — Z9181 History of falling: Secondary | ICD-10-CM | POA: Diagnosis not present

## 2019-03-24 LAB — CULTURE, BLOOD (ROUTINE X 2)
Culture: NO GROWTH
Culture: NO GROWTH
Special Requests: ADEQUATE
Special Requests: ADEQUATE

## 2019-03-27 DIAGNOSIS — J449 Chronic obstructive pulmonary disease, unspecified: Secondary | ICD-10-CM | POA: Diagnosis not present

## 2019-03-27 DIAGNOSIS — R7989 Other specified abnormal findings of blood chemistry: Secondary | ICD-10-CM | POA: Diagnosis not present

## 2019-03-27 DIAGNOSIS — N179 Acute kidney failure, unspecified: Secondary | ICD-10-CM | POA: Diagnosis not present

## 2019-03-27 DIAGNOSIS — Z87891 Personal history of nicotine dependence: Secondary | ICD-10-CM | POA: Diagnosis not present

## 2019-03-27 DIAGNOSIS — N1831 Chronic kidney disease, stage 3a: Secondary | ICD-10-CM | POA: Diagnosis not present

## 2019-03-27 DIAGNOSIS — Z9181 History of falling: Secondary | ICD-10-CM | POA: Diagnosis not present

## 2019-03-27 DIAGNOSIS — E1122 Type 2 diabetes mellitus with diabetic chronic kidney disease: Secondary | ICD-10-CM | POA: Diagnosis not present

## 2019-03-27 DIAGNOSIS — I10 Essential (primary) hypertension: Secondary | ICD-10-CM | POA: Diagnosis not present

## 2019-03-27 DIAGNOSIS — E875 Hyperkalemia: Secondary | ICD-10-CM | POA: Diagnosis not present

## 2019-03-29 DIAGNOSIS — N1831 Chronic kidney disease, stage 3a: Secondary | ICD-10-CM | POA: Diagnosis not present

## 2019-03-29 DIAGNOSIS — I10 Essential (primary) hypertension: Secondary | ICD-10-CM | POA: Diagnosis not present

## 2019-03-29 DIAGNOSIS — N179 Acute kidney failure, unspecified: Secondary | ICD-10-CM | POA: Diagnosis not present

## 2019-03-29 DIAGNOSIS — Z87891 Personal history of nicotine dependence: Secondary | ICD-10-CM | POA: Diagnosis not present

## 2019-03-29 DIAGNOSIS — Z9181 History of falling: Secondary | ICD-10-CM | POA: Diagnosis not present

## 2019-03-29 DIAGNOSIS — E875 Hyperkalemia: Secondary | ICD-10-CM | POA: Diagnosis not present

## 2019-03-29 DIAGNOSIS — R7989 Other specified abnormal findings of blood chemistry: Secondary | ICD-10-CM | POA: Diagnosis not present

## 2019-03-29 DIAGNOSIS — E1122 Type 2 diabetes mellitus with diabetic chronic kidney disease: Secondary | ICD-10-CM | POA: Diagnosis not present

## 2019-03-29 DIAGNOSIS — J449 Chronic obstructive pulmonary disease, unspecified: Secondary | ICD-10-CM | POA: Diagnosis not present

## 2019-03-30 DIAGNOSIS — E875 Hyperkalemia: Secondary | ICD-10-CM | POA: Diagnosis not present

## 2019-03-30 DIAGNOSIS — Z87891 Personal history of nicotine dependence: Secondary | ICD-10-CM | POA: Diagnosis not present

## 2019-03-30 DIAGNOSIS — N1831 Chronic kidney disease, stage 3a: Secondary | ICD-10-CM | POA: Diagnosis not present

## 2019-03-30 DIAGNOSIS — J449 Chronic obstructive pulmonary disease, unspecified: Secondary | ICD-10-CM | POA: Diagnosis not present

## 2019-03-30 DIAGNOSIS — E1122 Type 2 diabetes mellitus with diabetic chronic kidney disease: Secondary | ICD-10-CM | POA: Diagnosis not present

## 2019-03-30 DIAGNOSIS — I10 Essential (primary) hypertension: Secondary | ICD-10-CM | POA: Diagnosis not present

## 2019-03-30 DIAGNOSIS — Z9181 History of falling: Secondary | ICD-10-CM | POA: Diagnosis not present

## 2019-03-30 DIAGNOSIS — N179 Acute kidney failure, unspecified: Secondary | ICD-10-CM | POA: Diagnosis not present

## 2019-03-30 DIAGNOSIS — R7989 Other specified abnormal findings of blood chemistry: Secondary | ICD-10-CM | POA: Diagnosis not present

## 2019-03-31 DIAGNOSIS — Z9181 History of falling: Secondary | ICD-10-CM | POA: Diagnosis not present

## 2019-03-31 DIAGNOSIS — I10 Essential (primary) hypertension: Secondary | ICD-10-CM | POA: Diagnosis not present

## 2019-03-31 DIAGNOSIS — E1122 Type 2 diabetes mellitus with diabetic chronic kidney disease: Secondary | ICD-10-CM | POA: Diagnosis not present

## 2019-03-31 DIAGNOSIS — R7989 Other specified abnormal findings of blood chemistry: Secondary | ICD-10-CM | POA: Diagnosis not present

## 2019-03-31 DIAGNOSIS — E875 Hyperkalemia: Secondary | ICD-10-CM | POA: Diagnosis not present

## 2019-03-31 DIAGNOSIS — N179 Acute kidney failure, unspecified: Secondary | ICD-10-CM | POA: Diagnosis not present

## 2019-03-31 DIAGNOSIS — J449 Chronic obstructive pulmonary disease, unspecified: Secondary | ICD-10-CM | POA: Diagnosis not present

## 2019-03-31 DIAGNOSIS — N1831 Chronic kidney disease, stage 3a: Secondary | ICD-10-CM | POA: Diagnosis not present

## 2019-03-31 DIAGNOSIS — Z87891 Personal history of nicotine dependence: Secondary | ICD-10-CM | POA: Diagnosis not present

## 2019-04-03 DIAGNOSIS — Z9181 History of falling: Secondary | ICD-10-CM | POA: Diagnosis not present

## 2019-04-03 DIAGNOSIS — Z87891 Personal history of nicotine dependence: Secondary | ICD-10-CM | POA: Diagnosis not present

## 2019-04-03 DIAGNOSIS — R7989 Other specified abnormal findings of blood chemistry: Secondary | ICD-10-CM | POA: Diagnosis not present

## 2019-04-03 DIAGNOSIS — N179 Acute kidney failure, unspecified: Secondary | ICD-10-CM | POA: Diagnosis not present

## 2019-04-03 DIAGNOSIS — N1831 Chronic kidney disease, stage 3a: Secondary | ICD-10-CM | POA: Diagnosis not present

## 2019-04-03 DIAGNOSIS — E1122 Type 2 diabetes mellitus with diabetic chronic kidney disease: Secondary | ICD-10-CM | POA: Diagnosis not present

## 2019-04-03 DIAGNOSIS — J449 Chronic obstructive pulmonary disease, unspecified: Secondary | ICD-10-CM | POA: Diagnosis not present

## 2019-04-03 DIAGNOSIS — I10 Essential (primary) hypertension: Secondary | ICD-10-CM | POA: Diagnosis not present

## 2019-04-03 DIAGNOSIS — E875 Hyperkalemia: Secondary | ICD-10-CM | POA: Diagnosis not present

## 2019-04-04 ENCOUNTER — Ambulatory Visit: Payer: Medicare Other | Attending: Internal Medicine

## 2019-04-04 ENCOUNTER — Other Ambulatory Visit: Payer: Self-pay

## 2019-04-04 DIAGNOSIS — Z20822 Contact with and (suspected) exposure to covid-19: Secondary | ICD-10-CM

## 2019-04-05 DIAGNOSIS — Z87891 Personal history of nicotine dependence: Secondary | ICD-10-CM | POA: Diagnosis not present

## 2019-04-05 DIAGNOSIS — Z9181 History of falling: Secondary | ICD-10-CM | POA: Diagnosis not present

## 2019-04-05 DIAGNOSIS — N1831 Chronic kidney disease, stage 3a: Secondary | ICD-10-CM | POA: Diagnosis not present

## 2019-04-05 DIAGNOSIS — E875 Hyperkalemia: Secondary | ICD-10-CM | POA: Diagnosis not present

## 2019-04-05 DIAGNOSIS — J449 Chronic obstructive pulmonary disease, unspecified: Secondary | ICD-10-CM | POA: Diagnosis not present

## 2019-04-05 DIAGNOSIS — E1122 Type 2 diabetes mellitus with diabetic chronic kidney disease: Secondary | ICD-10-CM | POA: Diagnosis not present

## 2019-04-05 DIAGNOSIS — R7989 Other specified abnormal findings of blood chemistry: Secondary | ICD-10-CM | POA: Diagnosis not present

## 2019-04-05 DIAGNOSIS — I10 Essential (primary) hypertension: Secondary | ICD-10-CM | POA: Diagnosis not present

## 2019-04-05 DIAGNOSIS — N179 Acute kidney failure, unspecified: Secondary | ICD-10-CM | POA: Diagnosis not present

## 2019-04-05 LAB — NOVEL CORONAVIRUS, NAA: SARS-CoV-2, NAA: NOT DETECTED

## 2019-04-07 DIAGNOSIS — N179 Acute kidney failure, unspecified: Secondary | ICD-10-CM | POA: Diagnosis not present

## 2019-04-07 DIAGNOSIS — Z9181 History of falling: Secondary | ICD-10-CM | POA: Diagnosis not present

## 2019-04-07 DIAGNOSIS — I10 Essential (primary) hypertension: Secondary | ICD-10-CM | POA: Diagnosis not present

## 2019-04-07 DIAGNOSIS — N1831 Chronic kidney disease, stage 3a: Secondary | ICD-10-CM | POA: Diagnosis not present

## 2019-04-07 DIAGNOSIS — E1122 Type 2 diabetes mellitus with diabetic chronic kidney disease: Secondary | ICD-10-CM | POA: Diagnosis not present

## 2019-04-07 DIAGNOSIS — R7989 Other specified abnormal findings of blood chemistry: Secondary | ICD-10-CM | POA: Diagnosis not present

## 2019-04-07 DIAGNOSIS — E875 Hyperkalemia: Secondary | ICD-10-CM | POA: Diagnosis not present

## 2019-04-07 DIAGNOSIS — J449 Chronic obstructive pulmonary disease, unspecified: Secondary | ICD-10-CM | POA: Diagnosis not present

## 2019-04-07 DIAGNOSIS — Z87891 Personal history of nicotine dependence: Secondary | ICD-10-CM | POA: Diagnosis not present

## 2019-04-10 DIAGNOSIS — E1122 Type 2 diabetes mellitus with diabetic chronic kidney disease: Secondary | ICD-10-CM | POA: Diagnosis not present

## 2019-04-10 DIAGNOSIS — J449 Chronic obstructive pulmonary disease, unspecified: Secondary | ICD-10-CM | POA: Diagnosis not present

## 2019-04-10 DIAGNOSIS — E875 Hyperkalemia: Secondary | ICD-10-CM | POA: Diagnosis not present

## 2019-04-10 DIAGNOSIS — R7989 Other specified abnormal findings of blood chemistry: Secondary | ICD-10-CM | POA: Diagnosis not present

## 2019-04-10 DIAGNOSIS — Z9181 History of falling: Secondary | ICD-10-CM | POA: Diagnosis not present

## 2019-04-10 DIAGNOSIS — N179 Acute kidney failure, unspecified: Secondary | ICD-10-CM | POA: Diagnosis not present

## 2019-04-10 DIAGNOSIS — N1831 Chronic kidney disease, stage 3a: Secondary | ICD-10-CM | POA: Diagnosis not present

## 2019-04-10 DIAGNOSIS — I10 Essential (primary) hypertension: Secondary | ICD-10-CM | POA: Diagnosis not present

## 2019-04-10 DIAGNOSIS — Z87891 Personal history of nicotine dependence: Secondary | ICD-10-CM | POA: Diagnosis not present

## 2019-04-11 DIAGNOSIS — J449 Chronic obstructive pulmonary disease, unspecified: Secondary | ICD-10-CM | POA: Diagnosis not present

## 2019-04-11 DIAGNOSIS — E1122 Type 2 diabetes mellitus with diabetic chronic kidney disease: Secondary | ICD-10-CM | POA: Diagnosis not present

## 2019-04-11 DIAGNOSIS — N183 Chronic kidney disease, stage 3 unspecified: Secondary | ICD-10-CM | POA: Diagnosis not present

## 2019-04-13 DIAGNOSIS — E875 Hyperkalemia: Secondary | ICD-10-CM | POA: Diagnosis not present

## 2019-04-13 DIAGNOSIS — E1122 Type 2 diabetes mellitus with diabetic chronic kidney disease: Secondary | ICD-10-CM | POA: Diagnosis not present

## 2019-04-13 DIAGNOSIS — Z87891 Personal history of nicotine dependence: Secondary | ICD-10-CM | POA: Diagnosis not present

## 2019-04-13 DIAGNOSIS — I10 Essential (primary) hypertension: Secondary | ICD-10-CM | POA: Diagnosis not present

## 2019-04-13 DIAGNOSIS — R7989 Other specified abnormal findings of blood chemistry: Secondary | ICD-10-CM | POA: Diagnosis not present

## 2019-04-13 DIAGNOSIS — N179 Acute kidney failure, unspecified: Secondary | ICD-10-CM | POA: Diagnosis not present

## 2019-04-13 DIAGNOSIS — N1831 Chronic kidney disease, stage 3a: Secondary | ICD-10-CM | POA: Diagnosis not present

## 2019-04-13 DIAGNOSIS — J449 Chronic obstructive pulmonary disease, unspecified: Secondary | ICD-10-CM | POA: Diagnosis not present

## 2019-04-13 DIAGNOSIS — Z9181 History of falling: Secondary | ICD-10-CM | POA: Diagnosis not present

## 2019-04-20 ENCOUNTER — Other Ambulatory Visit: Payer: Self-pay

## 2019-04-20 NOTE — Patient Outreach (Signed)
[facility]) Care Management  04/20/2019  Nicholas Swanson Nov 19, 1930 [id number]   Medication Adherence call to Nicholas Swanson Telephone call to Patient regarding Medication Adherence unable to reach patient,patient did not answer and voice mail is full. Nicholas Swanson is showing due on Atorvastatin 40 mg under [place].   [facility] Management Direct Dial [phone]  Fax [phone] [email or web address]

## 2019-04-26 DIAGNOSIS — E876 Hypokalemia: Secondary | ICD-10-CM | POA: Diagnosis not present

## 2019-05-19 DIAGNOSIS — E1122 Type 2 diabetes mellitus with diabetic chronic kidney disease: Secondary | ICD-10-CM | POA: Diagnosis not present

## 2019-05-19 DIAGNOSIS — N1831 Chronic kidney disease, stage 3a: Secondary | ICD-10-CM | POA: Diagnosis not present

## 2019-06-14 DIAGNOSIS — I129 Hypertensive chronic kidney disease with stage 1 through stage 4 chronic kidney disease, or unspecified chronic kidney disease: Secondary | ICD-10-CM | POA: Diagnosis not present

## 2019-06-14 DIAGNOSIS — N1831 Chronic kidney disease, stage 3a: Secondary | ICD-10-CM | POA: Diagnosis not present

## 2019-06-14 DIAGNOSIS — E1122 Type 2 diabetes mellitus with diabetic chronic kidney disease: Secondary | ICD-10-CM | POA: Diagnosis not present

## 2019-06-14 DIAGNOSIS — J449 Chronic obstructive pulmonary disease, unspecified: Secondary | ICD-10-CM | POA: Diagnosis not present

## 2019-08-01 DIAGNOSIS — I10 Essential (primary) hypertension: Secondary | ICD-10-CM | POA: Diagnosis not present

## 2019-08-01 DIAGNOSIS — N183 Chronic kidney disease, stage 3 unspecified: Secondary | ICD-10-CM | POA: Diagnosis not present

## 2019-08-01 DIAGNOSIS — E1169 Type 2 diabetes mellitus with other specified complication: Secondary | ICD-10-CM | POA: Diagnosis not present

## 2019-08-01 DIAGNOSIS — E785 Hyperlipidemia, unspecified: Secondary | ICD-10-CM | POA: Diagnosis not present

## 2019-11-14 DIAGNOSIS — I10 Essential (primary) hypertension: Secondary | ICD-10-CM | POA: Diagnosis not present

## 2019-11-14 DIAGNOSIS — I129 Hypertensive chronic kidney disease with stage 1 through stage 4 chronic kidney disease, or unspecified chronic kidney disease: Secondary | ICD-10-CM | POA: Diagnosis not present

## 2019-11-14 DIAGNOSIS — N183 Chronic kidney disease, stage 3 unspecified: Secondary | ICD-10-CM | POA: Diagnosis not present

## 2019-11-14 DIAGNOSIS — N1831 Chronic kidney disease, stage 3a: Secondary | ICD-10-CM | POA: Diagnosis not present

## 2019-11-14 DIAGNOSIS — E1122 Type 2 diabetes mellitus with diabetic chronic kidney disease: Secondary | ICD-10-CM | POA: Diagnosis not present

## 2019-11-14 DIAGNOSIS — E785 Hyperlipidemia, unspecified: Secondary | ICD-10-CM | POA: Diagnosis not present

## 2019-11-14 DIAGNOSIS — J449 Chronic obstructive pulmonary disease, unspecified: Secondary | ICD-10-CM | POA: Diagnosis not present

## 2019-12-14 DIAGNOSIS — J449 Chronic obstructive pulmonary disease, unspecified: Secondary | ICD-10-CM | POA: Diagnosis not present

## 2019-12-14 DIAGNOSIS — I129 Hypertensive chronic kidney disease with stage 1 through stage 4 chronic kidney disease, or unspecified chronic kidney disease: Secondary | ICD-10-CM | POA: Diagnosis not present

## 2019-12-14 DIAGNOSIS — E1122 Type 2 diabetes mellitus with diabetic chronic kidney disease: Secondary | ICD-10-CM | POA: Diagnosis not present

## 2019-12-14 DIAGNOSIS — N1831 Chronic kidney disease, stage 3a: Secondary | ICD-10-CM | POA: Diagnosis not present

## 2019-12-15 ENCOUNTER — Other Ambulatory Visit: Payer: Self-pay

## 2019-12-15 ENCOUNTER — Other Ambulatory Visit: Payer: Medicare Other

## 2019-12-15 DIAGNOSIS — Z20822 Contact with and (suspected) exposure to covid-19: Secondary | ICD-10-CM

## 2019-12-16 LAB — NOVEL CORONAVIRUS, NAA: SARS-CoV-2, NAA: NOT DETECTED

## 2019-12-16 LAB — SARS-COV-2, NAA 2 DAY TAT

## 2020-01-23 DIAGNOSIS — M1830 Unilateral post-traumatic osteoarthritis of first carpometacarpal joint, unspecified hand: Secondary | ICD-10-CM | POA: Diagnosis not present

## 2020-01-23 DIAGNOSIS — I10 Essential (primary) hypertension: Secondary | ICD-10-CM | POA: Diagnosis not present

## 2020-01-23 DIAGNOSIS — E1122 Type 2 diabetes mellitus with diabetic chronic kidney disease: Secondary | ICD-10-CM | POA: Diagnosis not present

## 2020-01-23 DIAGNOSIS — J449 Chronic obstructive pulmonary disease, unspecified: Secondary | ICD-10-CM | POA: Diagnosis not present

## 2020-03-15 DIAGNOSIS — N1831 Chronic kidney disease, stage 3a: Secondary | ICD-10-CM | POA: Diagnosis not present

## 2020-03-15 DIAGNOSIS — I129 Hypertensive chronic kidney disease with stage 1 through stage 4 chronic kidney disease, or unspecified chronic kidney disease: Secondary | ICD-10-CM | POA: Diagnosis not present

## 2020-03-15 DIAGNOSIS — J449 Chronic obstructive pulmonary disease, unspecified: Secondary | ICD-10-CM | POA: Diagnosis not present

## 2020-03-15 DIAGNOSIS — E1122 Type 2 diabetes mellitus with diabetic chronic kidney disease: Secondary | ICD-10-CM | POA: Diagnosis not present

## 2020-03-27 DIAGNOSIS — H40023 Open angle with borderline findings, high risk, bilateral: Secondary | ICD-10-CM | POA: Diagnosis not present

## 2020-03-27 DIAGNOSIS — H2589 Other age-related cataract: Secondary | ICD-10-CM | POA: Diagnosis not present

## 2020-05-05 ENCOUNTER — Encounter (HOSPITAL_COMMUNITY): Payer: Self-pay | Admitting: *Deleted

## 2020-05-05 ENCOUNTER — Other Ambulatory Visit: Payer: Self-pay

## 2020-05-05 ENCOUNTER — Emergency Department (HOSPITAL_COMMUNITY)
Admission: EM | Admit: 2020-05-05 | Discharge: 2020-05-05 | Disposition: A | Discharge status: Home / Self Care | Payer: Medicare Other | Attending: Emergency Medicine | Admitting: Emergency Medicine

## 2020-05-05 DIAGNOSIS — Z8546 Personal history of malignant neoplasm of prostate: Secondary | ICD-10-CM | POA: Insufficient documentation

## 2020-05-05 DIAGNOSIS — J449 Chronic obstructive pulmonary disease, unspecified: Secondary | ICD-10-CM | POA: Insufficient documentation

## 2020-05-05 DIAGNOSIS — E1122 Type 2 diabetes mellitus with diabetic chronic kidney disease: Secondary | ICD-10-CM | POA: Insufficient documentation

## 2020-05-05 DIAGNOSIS — Z79899 Other long term (current) drug therapy: Secondary | ICD-10-CM | POA: Diagnosis not present

## 2020-05-05 DIAGNOSIS — Z7984 Long term (current) use of oral hypoglycemic drugs: Secondary | ICD-10-CM | POA: Diagnosis not present

## 2020-05-05 DIAGNOSIS — E875 Hyperkalemia: Secondary | ICD-10-CM | POA: Diagnosis not present

## 2020-05-05 DIAGNOSIS — I10 Essential (primary) hypertension: Secondary | ICD-10-CM

## 2020-05-05 DIAGNOSIS — Z87891 Personal history of nicotine dependence: Secondary | ICD-10-CM | POA: Diagnosis not present

## 2020-05-05 DIAGNOSIS — I129 Hypertensive chronic kidney disease with stage 1 through stage 4 chronic kidney disease, or unspecified chronic kidney disease: Secondary | ICD-10-CM | POA: Insufficient documentation

## 2020-05-05 DIAGNOSIS — Z7982 Long term (current) use of aspirin: Secondary | ICD-10-CM | POA: Diagnosis not present

## 2020-05-05 DIAGNOSIS — N183 Chronic kidney disease, stage 3 unspecified: Secondary | ICD-10-CM | POA: Diagnosis not present

## 2020-05-05 LAB — CBC WITH DIFFERENTIAL/PLATELET
Abs Immature Granulocytes: 0.03 10*3/uL (ref 0.00–0.07)
Basophils Absolute: 0 10*3/uL (ref 0.0–0.1)
Basophils Relative: 1 %
Eosinophils Absolute: 0.3 10*3/uL (ref 0.0–0.5)
Eosinophils Relative: 4 %
HCT: 31.9 % — ABNORMAL LOW (ref 39.0–52.0)
Hemoglobin: 10.1 g/dL — ABNORMAL LOW (ref 13.0–17.0)
Immature Granulocytes: 0 %
Lymphocytes Relative: 26 %
Lymphs Abs: 1.9 10*3/uL (ref 0.7–4.0)
MCH: 30.4 pg (ref 26.0–34.0)
MCHC: 31.7 g/dL (ref 30.0–36.0)
MCV: 96.1 fL (ref 80.0–100.0)
Monocytes Absolute: 0.6 10*3/uL (ref 0.1–1.0)
Monocytes Relative: 8 %
Neutro Abs: 4.6 10*3/uL (ref 1.7–7.7)
Neutrophils Relative %: 61 %
Platelets: 243 10*3/uL (ref 150–400)
RBC: 3.32 MIL/uL — ABNORMAL LOW (ref 4.22–5.81)
RDW: 13.8 % (ref 11.5–15.5)
WBC: 7.5 10*3/uL (ref 4.0–10.5)
nRBC: 0 % (ref 0.0–0.2)

## 2020-05-05 LAB — BASIC METABOLIC PANEL
Anion gap: 4 — ABNORMAL LOW (ref 5–15)
BUN: 27 mg/dL — ABNORMAL HIGH (ref 8–23)
CO2: 22 mmol/L (ref 22–32)
Calcium: 8.6 mg/dL — ABNORMAL LOW (ref 8.9–10.3)
Chloride: 109 mmol/L (ref 98–111)
Creatinine, Ser: 2.15 mg/dL — ABNORMAL HIGH (ref 0.61–1.24)
GFR, Estimated: 29 mL/min — ABNORMAL LOW (ref >60)
Glucose, Bld: 117 mg/dL — ABNORMAL HIGH (ref 70–99)
Potassium: 5.7 mmol/L — ABNORMAL HIGH (ref 3.5–5.1)
Sodium: 135 mmol/L (ref 135–145)

## 2020-05-05 NOTE — ED Provider Notes (Signed)
[facility] EMERGENCY DEPARTMENT Provider Note   CSN: [id number] Arrival date & time: 05/05/20  1256     History Chief Complaint  Patient presents with  . Hypertension    Nicholas Swanson is a 87 y.o. male.  Patient followed by Dr. Berdine Addison.  Known history of high blood pressure.  Currently on Norvasc and Toprol-XL.  Has been taking both of them.  Had a slight headache during the night but is now resolved.  Blood pressure this morning was elevated 170s over 80.  Blood pressure here was 169/79.  Patient completely asymptomatic.  Labs without any significant abnormalities other than a potassium slightly elevated at 5.7.  Has chronic renal failure is no significant change in that.  Patient denies any headache any shortness of breath chest pain or any strokelike symptoms.        Past Medical History:  Diagnosis Date  . COPD (chronic obstructive pulmonary disease) ([place])   . Diabetes mellitus   . Hypertension     Patient Active Problem List   Diagnosis Date Noted  . Acute respiratory disease due to COVID-19 virus 02/23/2019  . Type 2 diabetes mellitus with stage 3 chronic kidney disease ([place]) 02/18/2019  . Pneumonia due to COVID-19 virus 02/19/2019  . Elevated LFTs 02/21/2019  . AKI (acute kidney injury) on CKD III 02/28/2019  . HYPERLIPIDEMIA 03/31/2006  . OVERWEIGHT 03/31/2006  . DIABETES MELLITUS, TYPE II 12/17/2005  . CARPAL TUNNEL SYNDROME 12/17/2005  . Essential hypertension 12/17/2005  . Chronic airway obstruction, not elsewhere classified 12/17/2005  . RENAL INSUFFICIENCY 12/17/2005  . PROSTATE CANCER, HX OF 12/17/2005    History reviewed. No pertinent surgical history.     History reviewed. No pertinent family history.  Social History   Tobacco Use  . Smoking status: Former Smoker    Types: Cigarettes  . Smokeless tobacco: Never Used  Vaping Use  . Vaping Use: Never used  Substance Use Topics  . Alcohol use: No  . Drug use: No    Home Medications Prior  to Admission medications   Medication Sig Start Date End Date Taking? Authorizing Provider  albuterol (PROVENTIL) (2.5 MG/3ML) 0.083% nebulizer solution Take 3 mLs (2.5 mg total) by nebulization every 6 (six) hours as needed for wheezing or shortness of breath. 03/20/19   Elodia Florence., MD  albuterol (VENTOLIN HFA) 108 (90 Base) MCG/ACT inhaler Inhale 1-2 puffs into the lungs every 6 (six) hours as needed for wheezing or shortness of breath.  02/20/19   [provider]  amLODipine (NORVASC) 10 MG tablet Take 10 mg by mouth daily. 02/06/19   [provider]  aspirin EC 81 MG tablet Take 81 mg by mouth daily.    [provider]  atorvastatin (LIPITOR) 40 MG tablet Take 1 tablet (40 mg total) by mouth daily. 03/20/19 04/19/19  Elodia Florence., MD  glipiZIDE (GLUCOTROL XL) 2.5 MG 24 hr tablet Take 2.5 mg by mouth daily.    [provider]  metFORMIN (GLUCOPHAGE) 1000 MG tablet Take 1,000 mg by mouth 2 (two) times daily with a meal.    [provider]  metoprolol succinate (TOPROL-XL) 50 MG 24 hr tablet Take 1 tablet (50 mg total) by mouth daily. 03/21/19 04/20/19  Elodia Florence., MD  sodium polystyrene (KAYEXALATE) powder Take 15 grams daily for 7 days.  Please follow up with your PCP for repeat labs once this is complete. 03/20/19   Elodia Florence., MD  terazosin (  HYTRIN) 5 MG capsule Take 5 mg by mouth at bedtime.    [provider]    Allergies    Ace inhibitors and Simvastatin  Review of Systems   Review of Systems  Constitutional: Negative for chills and fever.  HENT: Negative for congestion, rhinorrhea and sore throat.   Eyes: Negative for visual disturbance.  Respiratory: Negative for cough and shortness of breath.   Cardiovascular: Negative for chest pain and leg swelling.  Gastrointestinal: Negative for abdominal pain, diarrhea, nausea and vomiting.  Genitourinary: Negative for dysuria.  Musculoskeletal: Negative  for back pain and neck pain.  Skin: Negative for rash.  Neurological: Positive for headaches. Negative for dizziness and light-headedness.  Hematological: Does not bruise/bleed easily.  Psychiatric/Behavioral: Negative for confusion.    Physical Exam Updated Vital Signs BP (!) 170/78 (BP Location: Left Arm)   Pulse 67   Temp 98.6 F (37 C) (Oral)   Resp 14   Ht 1.854 m (6\' 1" )   Wt 82.1 kg   SpO2 100%   BMI 23.88 kg/m   Physical Exam Vitals and nursing note reviewed.  Constitutional:      General: He is not in acute distress.    Appearance: Normal appearance. He is well-developed and well-nourished.  HENT:     Head: Normocephalic and atraumatic.  Eyes:     Extraocular Movements: Extraocular movements intact.     Conjunctiva/sclera: Conjunctivae normal.     Pupils: Pupils are equal, round, and reactive to light.  Cardiovascular:     Rate and Rhythm: Normal rate and regular rhythm.     Heart sounds: No murmur heard.   Pulmonary:     Effort: Pulmonary effort is normal. No respiratory distress.     Breath sounds: Normal breath sounds.  Abdominal:     Palpations: Abdomen is soft.     Tenderness: There is no abdominal tenderness.  Musculoskeletal:        General: No edema. Normal range of motion.     Cervical back: Neck supple.  Skin:    General: Skin is warm and dry.     Capillary Refill: Capillary refill takes less than 2 seconds.  Neurological:     General: No focal deficit present.     Mental Status: He is alert and oriented to person, place, and time.     Cranial Nerves: No cranial nerve deficit.     Sensory: No sensory deficit.     Motor: No weakness.  Psychiatric:        Mood and Affect: Mood and affect normal.     ED Results / Procedures / Treatments   Labs (all labs ordered are listed, but only abnormal results are displayed) Labs Reviewed  CBC WITH DIFFERENTIAL/PLATELET - Abnormal; Notable for the following components:      Result Value   RBC 3.32  (*)    Hemoglobin 10.1 (*)    HCT 31.9 (*)    All other components within normal limits  BASIC METABOLIC PANEL - Abnormal; Notable for the following components:   Potassium 5.7 (*)    Glucose, Bld 117 (*)    BUN 27 (*)    Creatinine, Ser 2.15 (*)    Calcium 8.6 (*)    GFR, Estimated 29 (*)    Anion gap 4 (*)    All other components within normal limits    EKG None  Radiology No results found.  Procedures Procedures   Patient no acute distress.  No complaints of headache  chest pain shortness of breath or any strokelike symptoms.  Exam is normal.  Labs with a slight elevation in potassium.  Renal function is baseline.  Has a history of chronic renal failure.  Blood pressure here a little concerning but nothing that requires admission for hypertensive urgency or emergency.  Blood pressure was 169/79.  Patient can keep a blood pressure log and report to regular doctor.  And to see what the trend is and see if he needs adjustment in his blood pressure medicine gradually.  Medications Ordered in ED Medications - No data to display  ED Course  I have reviewed the triage vital signs and the nursing notes.  Pertinent labs & imaging results that were available during my care of the patient were reviewed by me and considered in my medical decision making (see chart for details).    MDM Rules/Calculators/A&P                           Final Clinical Impression(s) / ED Diagnoses Final diagnoses:  Primary hypertension    Rx / DC Orders ED Discharge Orders    None       Fredia Sorrow, MD 05/05/20 1710

## 2020-05-05 NOTE — Discharge Instructions (Addendum)
Make an appointment to follow-up with your primary care doctor in about a week.  Would recommend keeping a blood pressure log daily to record your blood pressures.  This will help your primary care doctor decide whether you need adjustments in your medicines.  We would not recommend any adjustments based on the pressure we got here today.  Although it was elevated at 169/79.  It is important to know which her trend is.  Potassium was a little elevated today at 5.7.  Would also recommend your primary care doctor to recheck that.  Return for development of a severe headache chest pain trouble breathing or any strokelike symptoms no matter what your blood pressure is.

## 2020-05-05 NOTE — ED Triage Notes (Signed)
Pt states BP high at home x 2 this morning.  170's over 80's at home per pt.

## 2020-05-13 DIAGNOSIS — I10 Essential (primary) hypertension: Secondary | ICD-10-CM | POA: Diagnosis not present

## 2020-05-13 DIAGNOSIS — E039 Hypothyroidism, unspecified: Secondary | ICD-10-CM | POA: Diagnosis not present

## 2020-05-13 DIAGNOSIS — E7849 Other hyperlipidemia: Secondary | ICD-10-CM | POA: Diagnosis not present

## 2020-05-24 DIAGNOSIS — H2589 Other age-related cataract: Secondary | ICD-10-CM | POA: Diagnosis not present

## 2020-05-24 DIAGNOSIS — H25811 Combined forms of age-related cataract, right eye: Secondary | ICD-10-CM | POA: Diagnosis not present

## 2020-07-07 IMAGING — DX DG CHEST 1V PORT
2 series · 2 of 2 positions shown · non-contrast
Comparison: Prior chest x-ray 03/04/2019

CLINICAL DATA: 88-year-old male with progressive shortness of
breath for the past 2 days. Diagnosed with COVID 1 week ago.

EXAM:
PORTABLE CHEST 1 VIEW

[chest ap (1 of 2)]
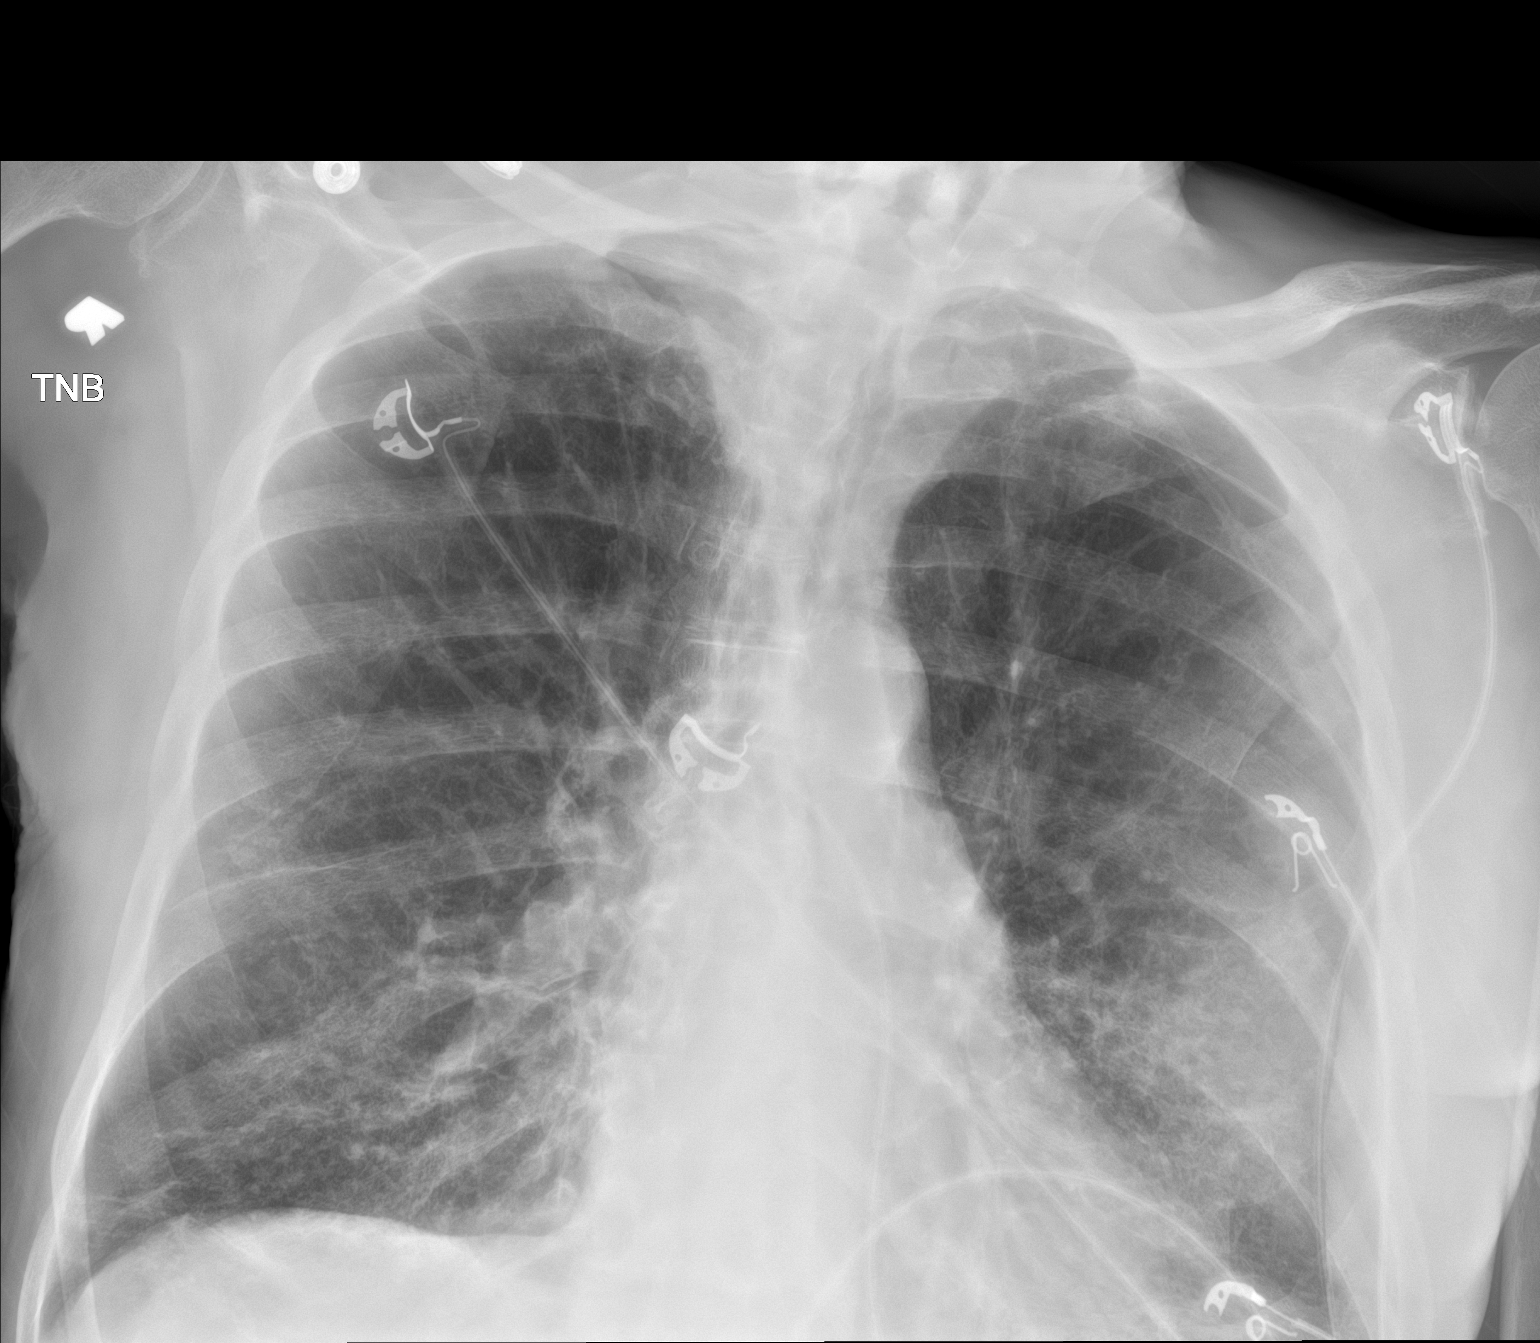

[chest ap (2 of 2)]
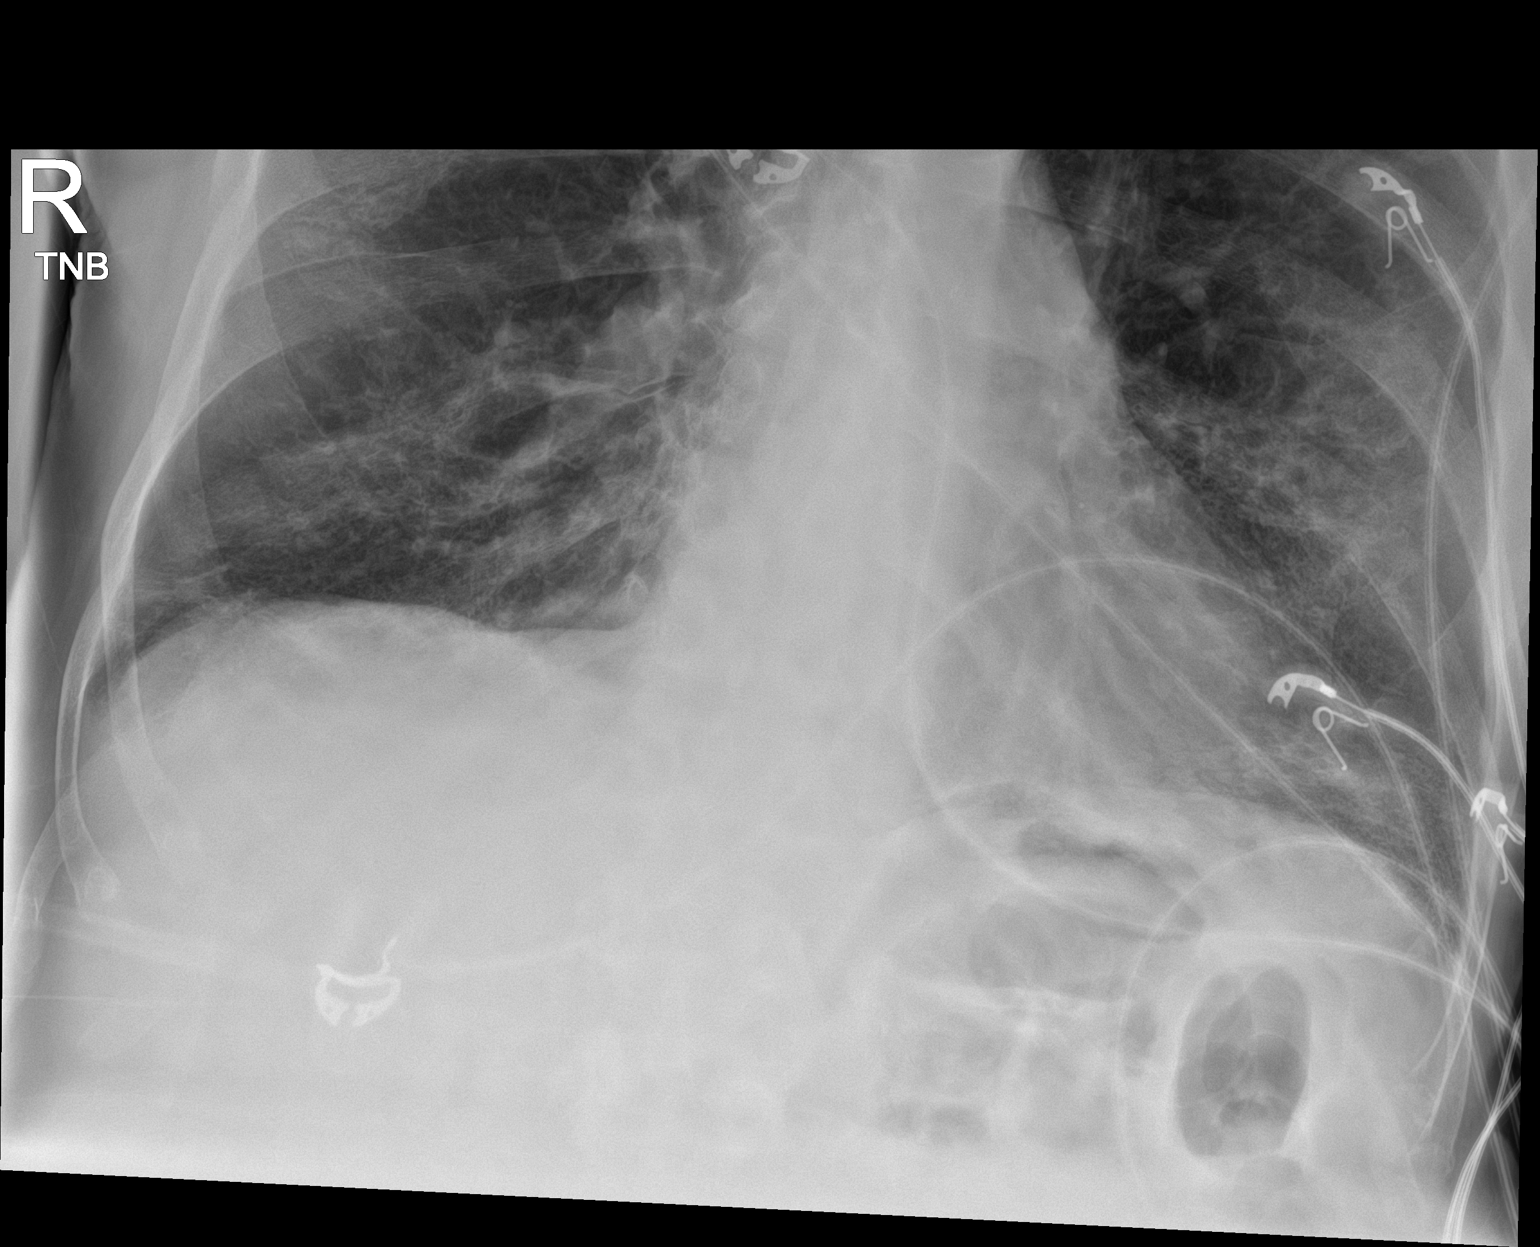

[2 of 2 positions shown; findings below may reference images not displayed]

FINDINGS: Developing multifocal interstitial and airspace opacities most
confluent in the periphery of the left lower lung. Changes are
superimposed on a background of emphysema and chronic bronchitic
changes. Cardiac and mediastinal contours are within normal limits.
No acute osseous abnormality.
IMPRESSION: 1. Developing bilateral patchy airspace opacities worse on the left
than the right. Findings are concerning for progression 2 multifocal
COVID pneumonia.
2. Background emphysema and chronic bronchitic changes.

## 2020-07-08 IMAGING — US US ABDOMEN LIMITED
1 series · 14 of 25 positions shown · non-contrast
Comparison: None.

CLINICAL DATA: 88-year-old male with elevated LFTs

EXAM:
ULTRASOUND ABDOMEN LIMITED RIGHT UPPER QUADRANT

[Series 1: us abdomen limited · 0.27mm/px · 14 of 39 slices shown]
[im 1/39]
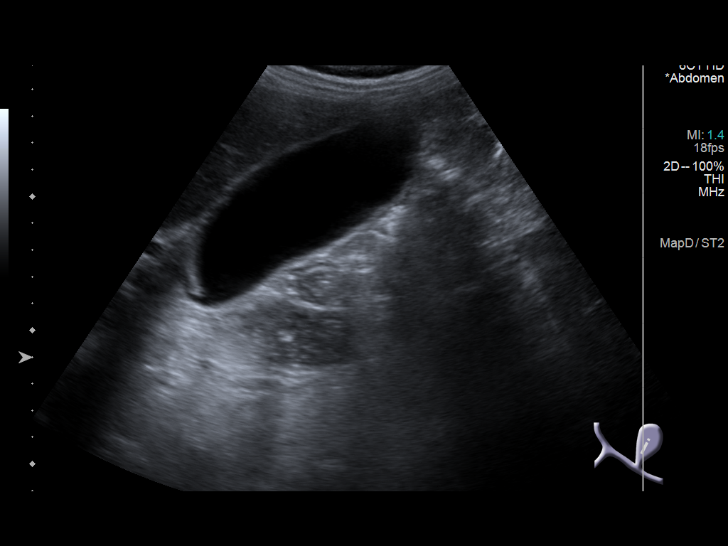
[im 4/39]
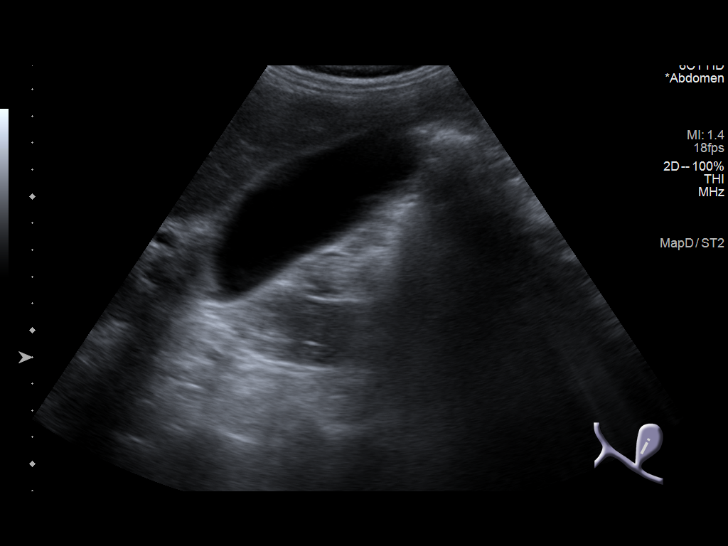
[im 7/39]
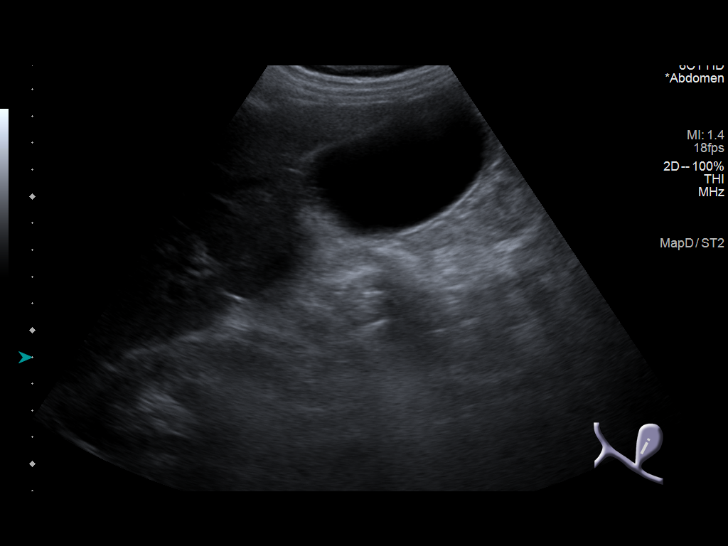
[im 10/39]
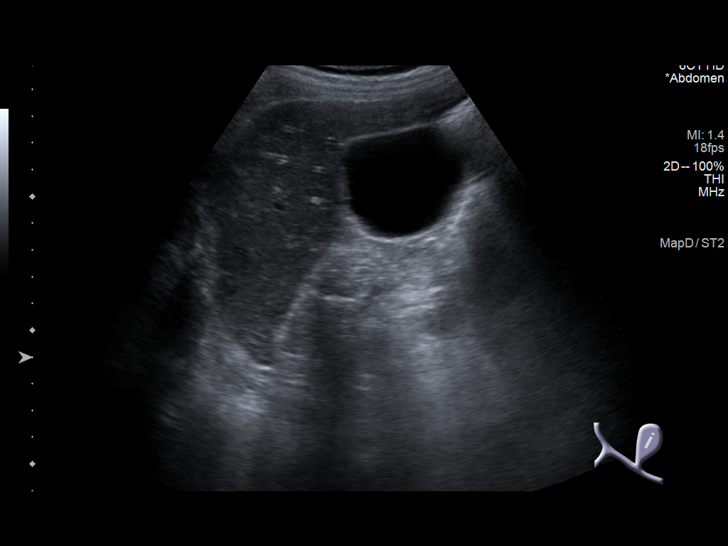
[im 13/39]
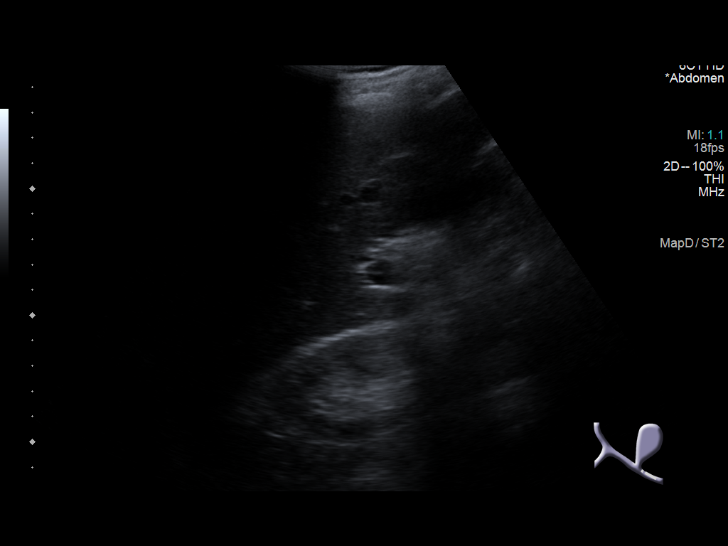
[im 15/39]
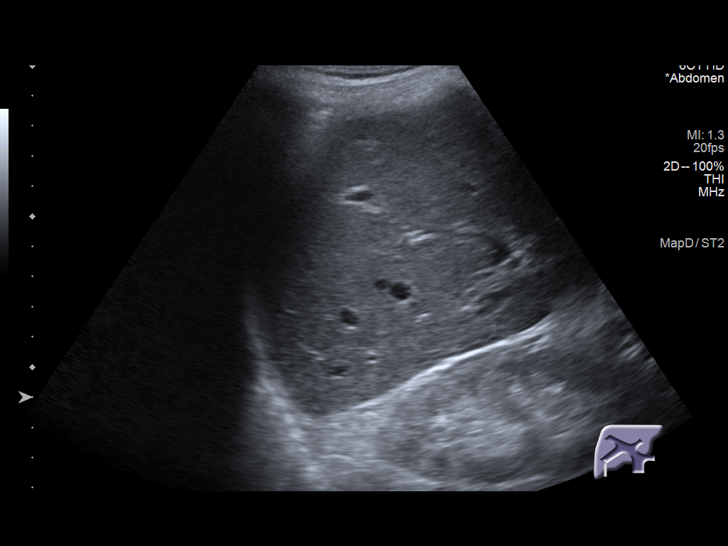
[im 18/39]
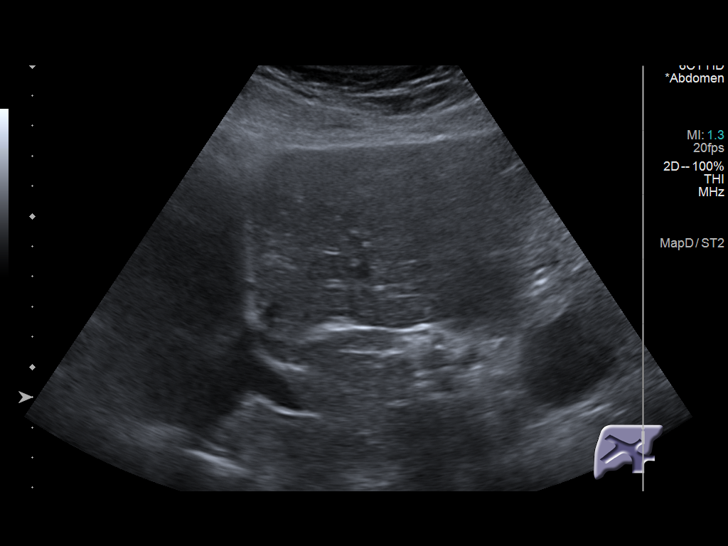
[im 21/39]
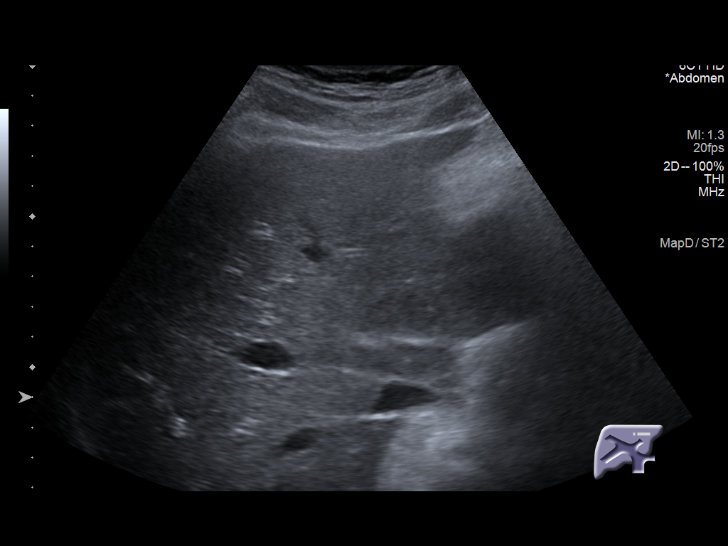
[im 24/39]
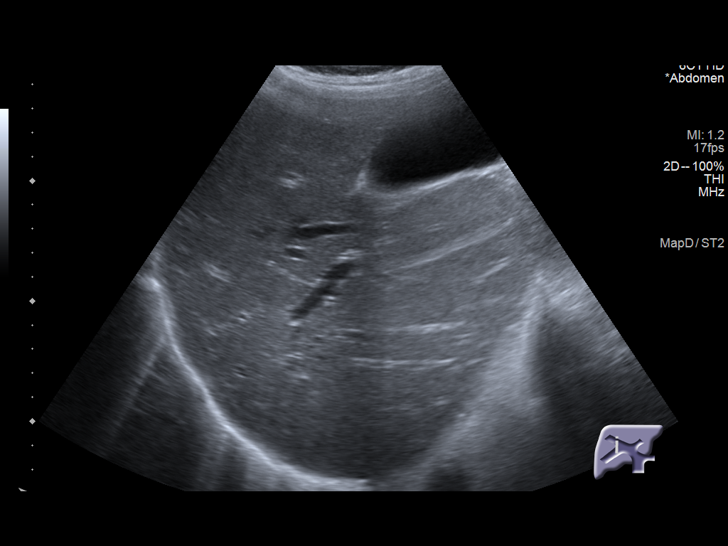
[im 26/39]
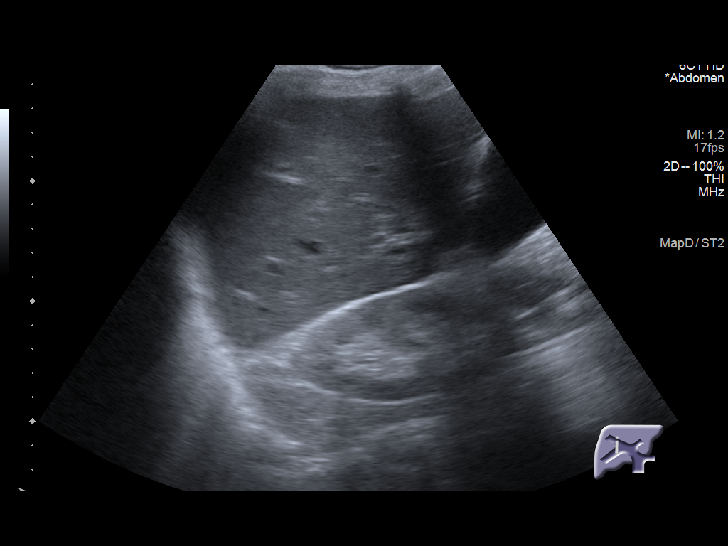
[im 29/39]
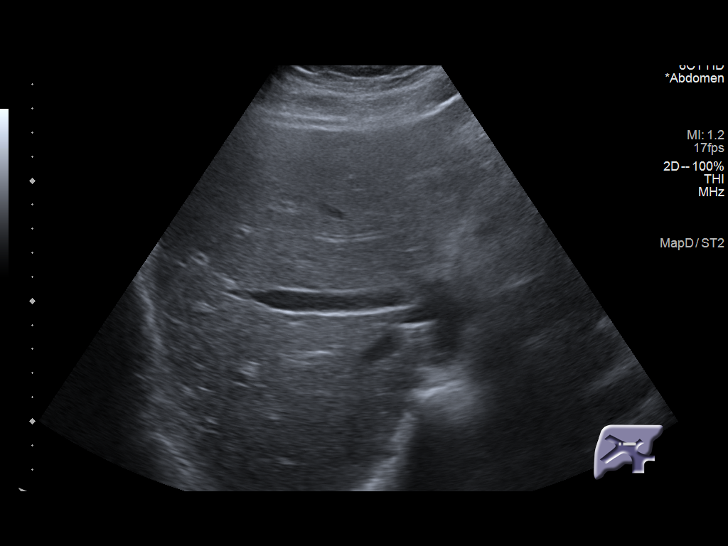
[im 32/39]
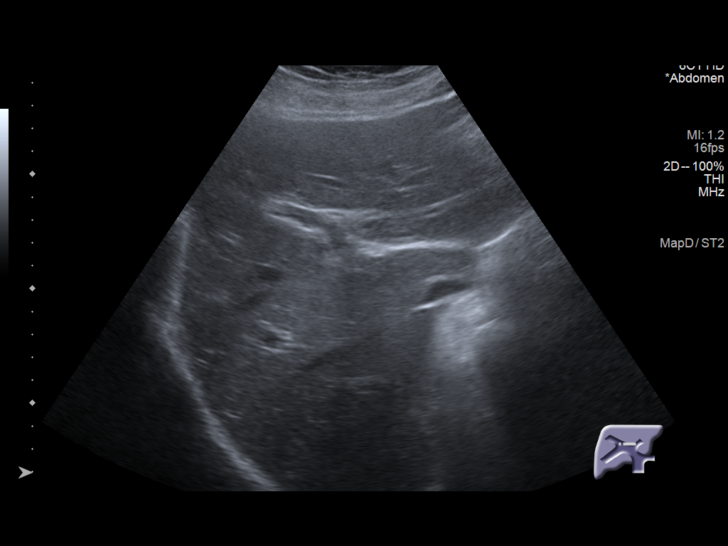
[im 35/39]
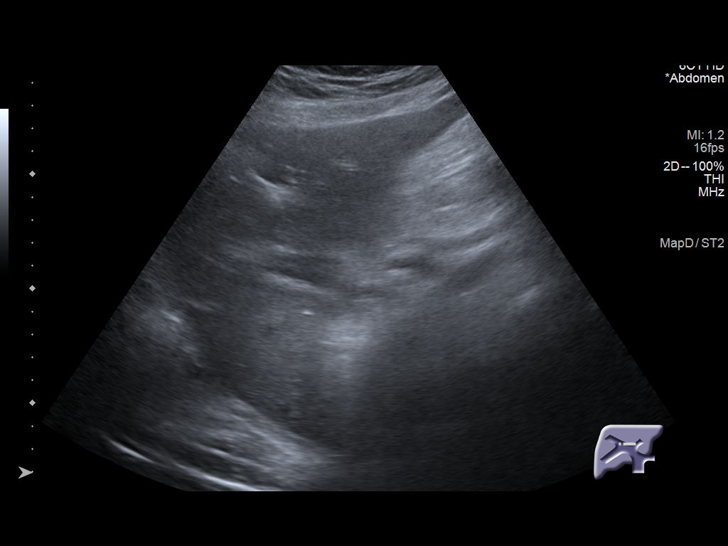
[im 39/39]
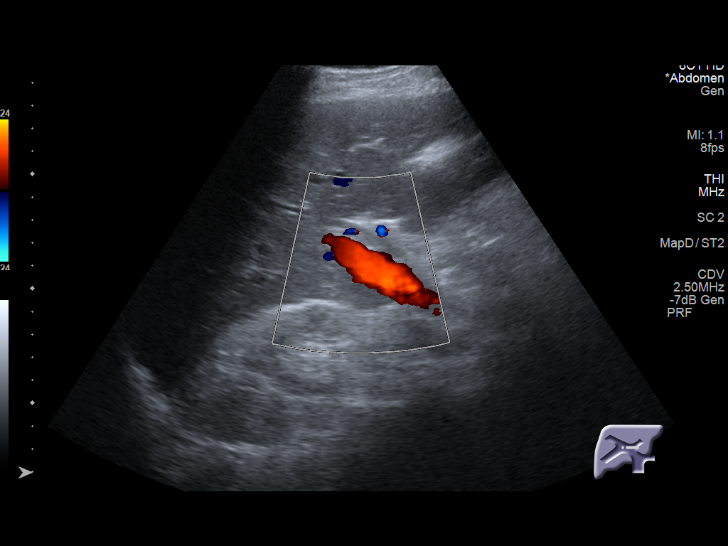

[14 of 25 positions shown; findings below may reference images not displayed]

FINDINGS: Gallbladder:

No gallstones or wall thickening visualized. No sonographic Murphy
sign noted by sonographer.

Common bile duct:

Diameter: Normal for age at 6 mm

Liver:

No focal lesion identified. Within normal limits in parenchymal
echogenicity. Portal vein is patent on color Doppler imaging with
normal direction of blood flow towards the liver.

Other: Incidental imaging of the right kidney demonstrates increased
parenchymal echogenicity.
IMPRESSION: 1. Normal sonographic appearance of the liver.
2. Increased parenchymal echogenicity of the right kidney suggesting
underlying medical renal disease.

## 2020-07-14 IMAGING — DX DG CHEST 1V PORT
1 series · 1 of 1 positions shown · non-contrast
Comparison: Chest radiographs 03/11/2019 and earlier

CLINICAL DATA: Hypoxia

EXAM:
PORTABLE CHEST 1 VIEW

[chest]
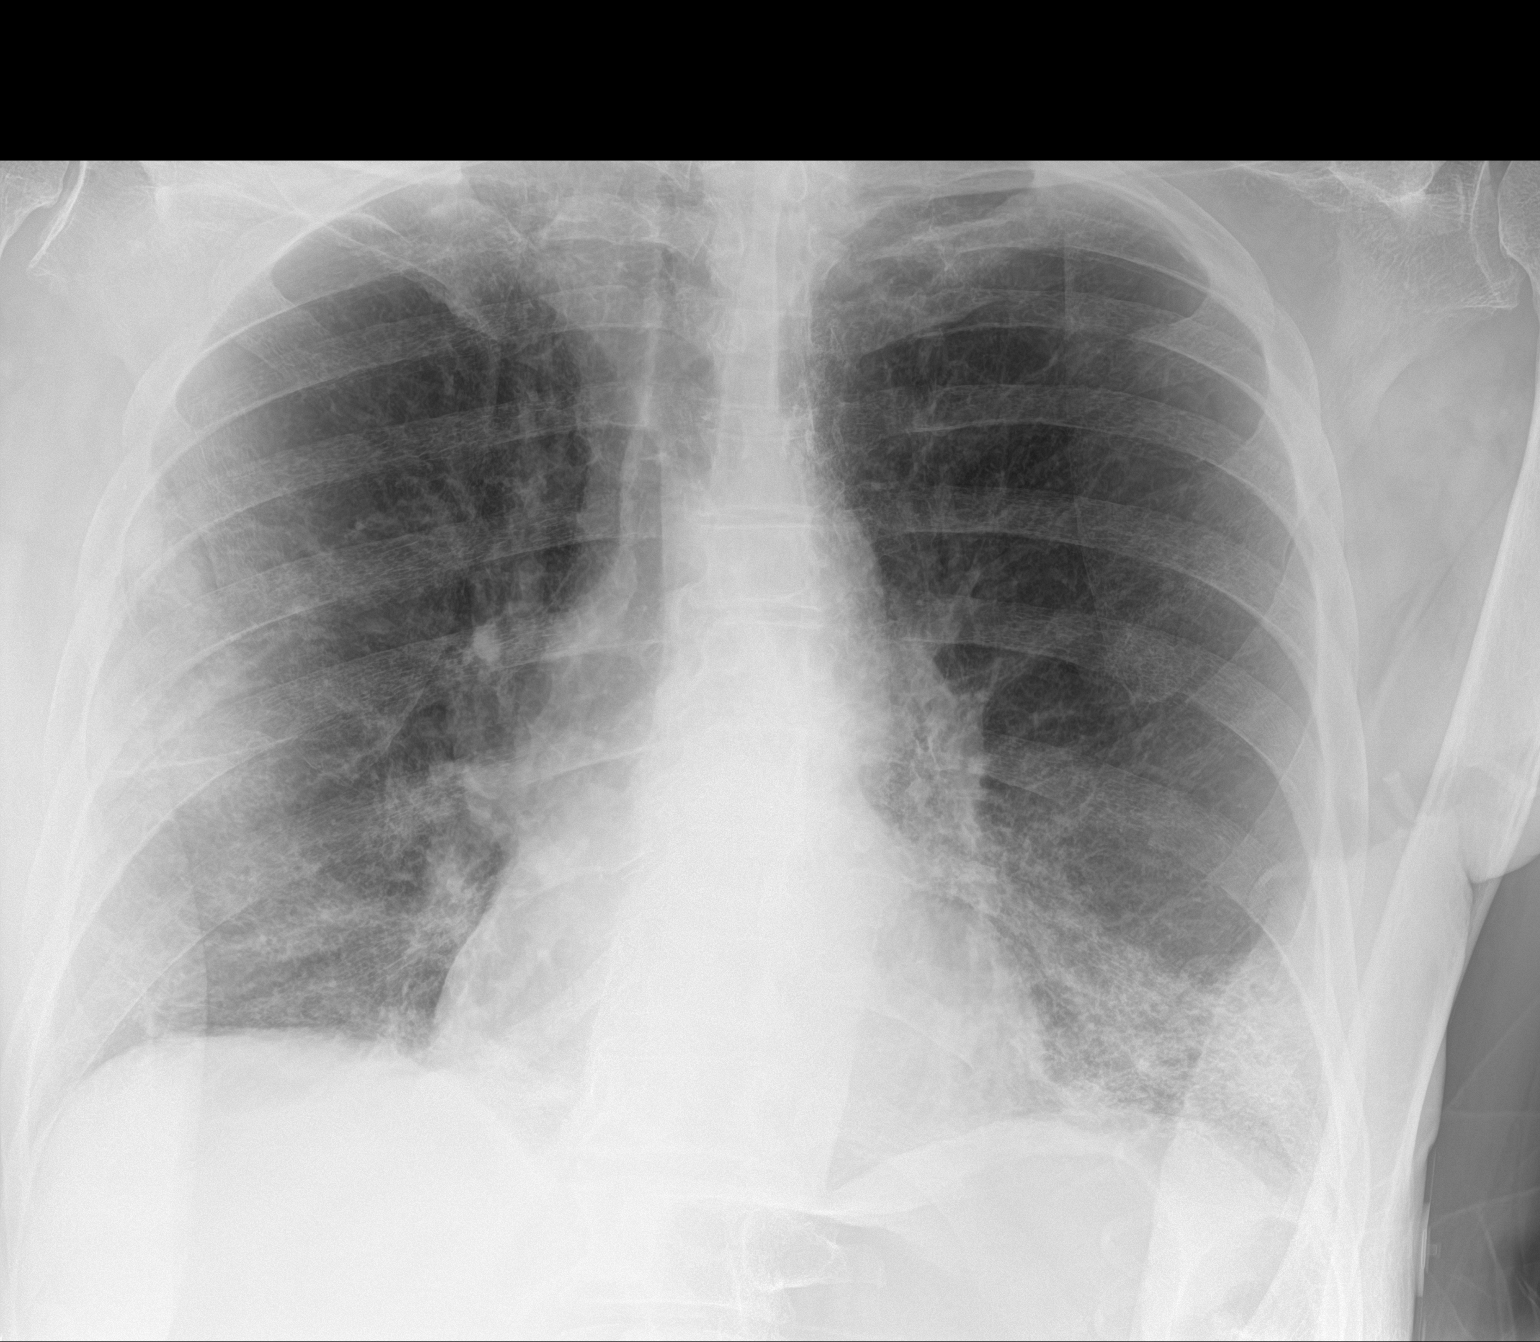

[1 of 1 positions shown; findings below may reference images not displayed]

FINDINGS: Heart size within normal limits. Again demonstrated are patchy
airspace opacities within the right mid to lower lung field and left
lung base. Findings are more conspicuous on the right as compared to
examination 03/11/2019. No evidence of pleural effusion or
pneumothorax.
IMPRESSION: Redemonstrated patchy airspace opacities within the right mid to
lower lung field and at the left lung base. Findings have increased
in conspicuity on the right as compared to prior examination
03/11/2019.

## 2020-10-21 DIAGNOSIS — N183 Chronic kidney disease, stage 3 unspecified: Secondary | ICD-10-CM | POA: Diagnosis not present

## 2020-10-21 DIAGNOSIS — D631 Anemia in chronic kidney disease: Secondary | ICD-10-CM | POA: Diagnosis not present

## 2020-10-21 DIAGNOSIS — I10 Essential (primary) hypertension: Secondary | ICD-10-CM | POA: Diagnosis not present

## 2020-10-21 DIAGNOSIS — N184 Chronic kidney disease, stage 4 (severe): Secondary | ICD-10-CM | POA: Diagnosis not present

## 2020-10-21 DIAGNOSIS — E1169 Type 2 diabetes mellitus with other specified complication: Secondary | ICD-10-CM | POA: Diagnosis not present

## 2020-10-21 DIAGNOSIS — E1122 Type 2 diabetes mellitus with diabetic chronic kidney disease: Secondary | ICD-10-CM | POA: Diagnosis not present

## 2020-10-21 DIAGNOSIS — J449 Chronic obstructive pulmonary disease, unspecified: Secondary | ICD-10-CM | POA: Diagnosis not present

## 2020-11-04 DIAGNOSIS — Z961 Presence of intraocular lens: Secondary | ICD-10-CM | POA: Diagnosis not present

## 2020-11-04 DIAGNOSIS — H40023 Open angle with borderline findings, high risk, bilateral: Secondary | ICD-10-CM | POA: Diagnosis not present

## 2020-11-08 DIAGNOSIS — R809 Proteinuria, unspecified: Secondary | ICD-10-CM | POA: Diagnosis not present

## 2020-11-08 DIAGNOSIS — D638 Anemia in other chronic diseases classified elsewhere: Secondary | ICD-10-CM | POA: Diagnosis not present

## 2020-11-08 DIAGNOSIS — E875 Hyperkalemia: Secondary | ICD-10-CM | POA: Diagnosis not present

## 2020-11-08 DIAGNOSIS — N184 Chronic kidney disease, stage 4 (severe): Secondary | ICD-10-CM | POA: Diagnosis not present

## 2020-11-08 DIAGNOSIS — I129 Hypertensive chronic kidney disease with stage 1 through stage 4 chronic kidney disease, or unspecified chronic kidney disease: Secondary | ICD-10-CM | POA: Diagnosis not present

## 2020-11-12 DIAGNOSIS — I129 Hypertensive chronic kidney disease with stage 1 through stage 4 chronic kidney disease, or unspecified chronic kidney disease: Secondary | ICD-10-CM | POA: Diagnosis not present

## 2020-11-12 DIAGNOSIS — R809 Proteinuria, unspecified: Secondary | ICD-10-CM | POA: Diagnosis not present

## 2020-11-12 DIAGNOSIS — E875 Hyperkalemia: Secondary | ICD-10-CM | POA: Diagnosis not present

## 2020-11-12 DIAGNOSIS — N184 Chronic kidney disease, stage 4 (severe): Secondary | ICD-10-CM | POA: Diagnosis not present

## 2020-11-12 DIAGNOSIS — Z79899 Other long term (current) drug therapy: Secondary | ICD-10-CM | POA: Diagnosis not present

## 2020-11-21 ENCOUNTER — Other Ambulatory Visit (HOSPITAL_COMMUNITY): Payer: Self-pay | Admitting: Nephrology

## 2020-11-21 DIAGNOSIS — N184 Chronic kidney disease, stage 4 (severe): Secondary | ICD-10-CM

## 2020-11-29 ENCOUNTER — Other Ambulatory Visit: Payer: Self-pay

## 2020-11-29 ENCOUNTER — Ambulatory Visit (HOSPITAL_COMMUNITY)
Admission: RE | Admit: 2020-11-29 | Discharge: 2020-11-29 | Disposition: A | Discharge status: Home / Self Care | Payer: Medicare Other | Source: Ambulatory Visit | Attending: Nephrology | Admitting: Nephrology

## 2020-11-29 DIAGNOSIS — N189 Chronic kidney disease, unspecified: Secondary | ICD-10-CM | POA: Diagnosis not present

## 2020-11-29 DIAGNOSIS — N281 Cyst of kidney, acquired: Secondary | ICD-10-CM | POA: Diagnosis not present

## 2020-11-29 DIAGNOSIS — N184 Chronic kidney disease, stage 4 (severe): Secondary | ICD-10-CM | POA: Insufficient documentation

## 2020-12-13 DIAGNOSIS — J449 Chronic obstructive pulmonary disease, unspecified: Secondary | ICD-10-CM | POA: Diagnosis not present

## 2020-12-13 DIAGNOSIS — E1122 Type 2 diabetes mellitus with diabetic chronic kidney disease: Secondary | ICD-10-CM | POA: Diagnosis not present

## 2020-12-13 DIAGNOSIS — N1831 Chronic kidney disease, stage 3a: Secondary | ICD-10-CM | POA: Diagnosis not present

## 2020-12-13 DIAGNOSIS — I129 Hypertensive chronic kidney disease with stage 1 through stage 4 chronic kidney disease, or unspecified chronic kidney disease: Secondary | ICD-10-CM | POA: Diagnosis not present

## 2020-12-18 DIAGNOSIS — E8722 Chronic metabolic acidosis: Secondary | ICD-10-CM | POA: Diagnosis not present

## 2020-12-18 DIAGNOSIS — N281 Cyst of kidney, acquired: Secondary | ICD-10-CM | POA: Diagnosis not present

## 2020-12-18 DIAGNOSIS — N170 Acute kidney failure with tubular necrosis: Secondary | ICD-10-CM | POA: Diagnosis not present

## 2020-12-18 DIAGNOSIS — I129 Hypertensive chronic kidney disease with stage 1 through stage 4 chronic kidney disease, or unspecified chronic kidney disease: Secondary | ICD-10-CM | POA: Diagnosis not present

## 2020-12-18 DIAGNOSIS — E559 Vitamin D deficiency, unspecified: Secondary | ICD-10-CM | POA: Diagnosis not present

## 2020-12-18 DIAGNOSIS — E211 Secondary hyperparathyroidism, not elsewhere classified: Secondary | ICD-10-CM | POA: Diagnosis not present

## 2020-12-18 DIAGNOSIS — D638 Anemia in other chronic diseases classified elsewhere: Secondary | ICD-10-CM | POA: Diagnosis not present

## 2020-12-18 DIAGNOSIS — N184 Chronic kidney disease, stage 4 (severe): Secondary | ICD-10-CM | POA: Diagnosis not present

## 2020-12-18 DIAGNOSIS — R809 Proteinuria, unspecified: Secondary | ICD-10-CM | POA: Diagnosis not present

## 2020-12-18 DIAGNOSIS — E875 Hyperkalemia: Secondary | ICD-10-CM | POA: Diagnosis not present

## 2021-01-07 ENCOUNTER — Encounter (HOSPITAL_COMMUNITY)
Admission: RE | Admit: 2021-01-07 | Discharge: 2021-01-07 | Disposition: A | Discharge status: Home / Self Care | Payer: Medicare Other | Source: Ambulatory Visit | Attending: Nephrology | Admitting: Nephrology

## 2021-01-07 DIAGNOSIS — D631 Anemia in chronic kidney disease: Secondary | ICD-10-CM | POA: Diagnosis not present

## 2021-01-07 DIAGNOSIS — N184 Chronic kidney disease, stage 4 (severe): Secondary | ICD-10-CM | POA: Insufficient documentation

## 2021-01-07 MED ORDER — EPOETIN ALFA-EPBX 3000 UNIT/ML IJ SOLN
3000.0000 [IU] | Freq: Once | INTRAMUSCULAR | Status: AC
  Administered 2021-01-07: 3000 [IU] via SUBCUTANEOUS

## 2021-01-07 MED ORDER — EPOETIN ALFA-EPBX 3000 UNIT/ML IJ SOLN
INTRAMUSCULAR | Status: AC
  Filled 2021-01-07: qty 1

## 2021-01-08 LAB — POCT HEMOGLOBIN-HEMACUE: Hemoglobin: 9.5 g/dL — ABNORMAL LOW (ref 13.0–17.0)

## 2021-01-13 DIAGNOSIS — N1831 Chronic kidney disease, stage 3a: Secondary | ICD-10-CM | POA: Diagnosis not present

## 2021-01-13 DIAGNOSIS — E1122 Type 2 diabetes mellitus with diabetic chronic kidney disease: Secondary | ICD-10-CM | POA: Diagnosis not present

## 2021-01-13 DIAGNOSIS — I129 Hypertensive chronic kidney disease with stage 1 through stage 4 chronic kidney disease, or unspecified chronic kidney disease: Secondary | ICD-10-CM | POA: Diagnosis not present

## 2021-01-21 ENCOUNTER — Other Ambulatory Visit: Payer: Self-pay

## 2021-01-21 ENCOUNTER — Encounter (HOSPITAL_COMMUNITY): Payer: Self-pay

## 2021-01-21 ENCOUNTER — Encounter (HOSPITAL_COMMUNITY)
Admission: RE | Admit: 2021-01-21 | Discharge: 2021-01-21 | Disposition: A | Discharge status: Home / Self Care | Payer: Medicare Other | Source: Ambulatory Visit | Attending: Nephrology | Admitting: Nephrology

## 2021-01-21 VITALS — BP 183/72 | HR 67 | Temp 98.3°F | Resp 16

## 2021-01-21 DIAGNOSIS — N184 Chronic kidney disease, stage 4 (severe): Secondary | ICD-10-CM | POA: Insufficient documentation

## 2021-01-21 DIAGNOSIS — I129 Hypertensive chronic kidney disease with stage 1 through stage 4 chronic kidney disease, or unspecified chronic kidney disease: Secondary | ICD-10-CM | POA: Diagnosis not present

## 2021-01-21 DIAGNOSIS — N179 Acute kidney failure, unspecified: Secondary | ICD-10-CM | POA: Insufficient documentation

## 2021-01-21 DIAGNOSIS — D631 Anemia in chronic kidney disease: Secondary | ICD-10-CM | POA: Diagnosis not present

## 2021-01-21 LAB — RENAL FUNCTION PANEL
Albumin: 4.1 g/dL (ref 3.5–5.0)
Anion gap: 8 (ref 5–15)
BUN: 51 mg/dL — ABNORMAL HIGH (ref 8–23)
CO2: 23 mmol/L (ref 22–32)
Calcium: 8.7 mg/dL — ABNORMAL LOW (ref 8.9–10.3)
Chloride: 104 mmol/L (ref 98–111)
Creatinine, Ser: 3.62 mg/dL — ABNORMAL HIGH (ref 0.61–1.24)
GFR, Estimated: 15 mL/min — ABNORMAL LOW (ref >60)
Glucose, Bld: 127 mg/dL — ABNORMAL HIGH (ref 70–99)
Phosphorus: 4.4 mg/dL (ref 2.5–4.6)
Potassium: 5.3 mmol/L — ABNORMAL HIGH (ref 3.5–5.1)
Sodium: 135 mmol/L (ref 135–145)

## 2021-01-21 LAB — POCT HEMOGLOBIN-HEMACUE: Hemoglobin: 10.4 g/dL — ABNORMAL LOW (ref 13.0–17.0)

## 2021-01-21 MED ORDER — EPOETIN ALFA-EPBX 3000 UNIT/ML IJ SOLN: 3000.0000 [IU] | Freq: Once | INTRAMUSCULAR | Status: DC

## 2021-01-23 ENCOUNTER — Other Ambulatory Visit (HOSPITAL_COMMUNITY): Payer: Self-pay | Admitting: Nephrology

## 2021-01-23 ENCOUNTER — Other Ambulatory Visit (HOSPITAL_BASED_OUTPATIENT_CLINIC_OR_DEPARTMENT_OTHER): Payer: Self-pay | Admitting: Nephrology

## 2021-01-23 DIAGNOSIS — N184 Chronic kidney disease, stage 4 (severe): Secondary | ICD-10-CM | POA: Diagnosis not present

## 2021-01-23 DIAGNOSIS — N170 Acute kidney failure with tubular necrosis: Secondary | ICD-10-CM

## 2021-01-23 DIAGNOSIS — I129 Hypertensive chronic kidney disease with stage 1 through stage 4 chronic kidney disease, or unspecified chronic kidney disease: Secondary | ICD-10-CM | POA: Diagnosis not present

## 2021-01-23 DIAGNOSIS — E875 Hyperkalemia: Secondary | ICD-10-CM | POA: Diagnosis not present

## 2021-01-30 ENCOUNTER — Ambulatory Visit (HOSPITAL_COMMUNITY)
Admission: RE | Admit: 2021-01-30 | Discharge: 2021-01-30 | Disposition: A | Discharge status: Home / Self Care | Payer: Medicare Other | Source: Ambulatory Visit | Attending: Nephrology | Admitting: Nephrology

## 2021-01-30 ENCOUNTER — Other Ambulatory Visit: Payer: Self-pay

## 2021-01-30 DIAGNOSIS — I129 Hypertensive chronic kidney disease with stage 1 through stage 4 chronic kidney disease, or unspecified chronic kidney disease: Secondary | ICD-10-CM | POA: Diagnosis not present

## 2021-01-30 DIAGNOSIS — N170 Acute kidney failure with tubular necrosis: Secondary | ICD-10-CM | POA: Insufficient documentation

## 2021-01-30 DIAGNOSIS — N189 Chronic kidney disease, unspecified: Secondary | ICD-10-CM | POA: Diagnosis not present

## 2021-01-30 DIAGNOSIS — E875 Hyperkalemia: Secondary | ICD-10-CM | POA: Diagnosis not present

## 2021-01-30 DIAGNOSIS — N281 Cyst of kidney, acquired: Secondary | ICD-10-CM | POA: Diagnosis not present

## 2021-01-30 DIAGNOSIS — N184 Chronic kidney disease, stage 4 (severe): Secondary | ICD-10-CM | POA: Diagnosis not present

## 2021-02-04 ENCOUNTER — Encounter (HOSPITAL_COMMUNITY)
Admission: RE | Admit: 2021-02-04 | Discharge: 2021-02-04 | Disposition: A | Discharge status: Home / Self Care | Payer: Medicare Other | Source: Ambulatory Visit | Attending: Nephrology | Admitting: Nephrology

## 2021-02-04 ENCOUNTER — Other Ambulatory Visit (HOSPITAL_COMMUNITY)
Admission: RE | Admit: 2021-02-04 | Discharge: 2021-02-04 | Disposition: A | Discharge status: Home / Self Care | Payer: Medicare Other | Source: Ambulatory Visit | Attending: Nephrology | Admitting: Nephrology

## 2021-02-04 ENCOUNTER — Other Ambulatory Visit: Payer: Self-pay

## 2021-02-04 DIAGNOSIS — E875 Hyperkalemia: Secondary | ICD-10-CM | POA: Diagnosis not present

## 2021-02-04 DIAGNOSIS — N170 Acute kidney failure with tubular necrosis: Secondary | ICD-10-CM | POA: Diagnosis not present

## 2021-02-04 DIAGNOSIS — N184 Chronic kidney disease, stage 4 (severe): Secondary | ICD-10-CM | POA: Diagnosis not present

## 2021-02-04 DIAGNOSIS — D638 Anemia in other chronic diseases classified elsewhere: Secondary | ICD-10-CM | POA: Diagnosis not present

## 2021-02-04 DIAGNOSIS — E8722 Chronic metabolic acidosis: Secondary | ICD-10-CM | POA: Diagnosis not present

## 2021-02-04 DIAGNOSIS — D631 Anemia in chronic kidney disease: Secondary | ICD-10-CM | POA: Insufficient documentation

## 2021-02-04 DIAGNOSIS — I129 Hypertensive chronic kidney disease with stage 1 through stage 4 chronic kidney disease, or unspecified chronic kidney disease: Secondary | ICD-10-CM | POA: Diagnosis not present

## 2021-02-04 LAB — RENAL FUNCTION PANEL
Albumin: 4.3 g/dL (ref 3.5–5.0)
Albumin: 4.3 g/dL (ref 3.5–5.0)
Anion gap: 10 (ref 5–15)
Anion gap: 9 (ref 5–15)
BUN: 56 mg/dL — ABNORMAL HIGH (ref 8–23)
BUN: 57 mg/dL — ABNORMAL HIGH (ref 8–23)
CO2: 22 mmol/L (ref 22–32)
CO2: 23 mmol/L (ref 22–32)
Calcium: 8.9 mg/dL (ref 8.9–10.3)
Calcium: 9.1 mg/dL (ref 8.9–10.3)
Chloride: 104 mmol/L (ref 98–111)
Chloride: 104 mmol/L (ref 98–111)
Creatinine, Ser: 3.55 mg/dL — ABNORMAL HIGH (ref 0.61–1.24)
Creatinine, Ser: 3.71 mg/dL — ABNORMAL HIGH (ref 0.61–1.24)
GFR, Estimated: 15 mL/min — ABNORMAL LOW (ref >60)
GFR, Estimated: 16 mL/min — ABNORMAL LOW (ref >60)
Glucose, Bld: 137 mg/dL — ABNORMAL HIGH (ref 70–99)
Glucose, Bld: 137 mg/dL — ABNORMAL HIGH (ref 70–99)
Phosphorus: 4.1 mg/dL (ref 2.5–4.6)
Phosphorus: 4.1 mg/dL (ref 2.5–4.6)
Potassium: 4.8 mmol/L (ref 3.5–5.1)
Potassium: 4.9 mmol/L (ref 3.5–5.1)
Sodium: 136 mmol/L (ref 135–145)
Sodium: 136 mmol/L (ref 135–145)

## 2021-02-04 LAB — CBC
HCT: 31.6 % — ABNORMAL LOW (ref 39.0–52.0)
Hemoglobin: 10.2 g/dL — ABNORMAL LOW (ref 13.0–17.0)
MCH: 30.1 pg (ref 26.0–34.0)
MCHC: 32.3 g/dL (ref 30.0–36.0)
MCV: 93.2 fL (ref 80.0–100.0)
Platelets: 261 10*3/uL (ref 150–400)
RBC: 3.39 MIL/uL — ABNORMAL LOW (ref 4.22–5.81)
RDW: 13.2 % (ref 11.5–15.5)
WBC: 7.3 10*3/uL (ref 4.0–10.5)
nRBC: 0 % (ref 0.0–0.2)

## 2021-02-04 LAB — HEPATITIS B CORE ANTIBODY, IGM: Hep B C IgM: NONREACTIVE

## 2021-02-04 LAB — IRON AND TIBC
Iron: 87 ug/dL (ref 45–182)
Saturation Ratios: 32 % (ref 17.9–39.5)
TIBC: 270 ug/dL (ref 250–450)
UIBC: 183 ug/dL

## 2021-02-04 LAB — FERRITIN: Ferritin: 48 ng/mL (ref 24–336)

## 2021-02-04 LAB — PROTEIN / CREATININE RATIO, URINE
Creatinine, Urine: 63.08 mg/dL
Protein Creatinine Ratio: 0.29 mg/mg{Cre} — ABNORMAL HIGH (ref 0.00–0.15)
Total Protein, Urine: 18 mg/dL

## 2021-02-04 LAB — HEPATITIS B SURFACE ANTIGEN: Hepatitis B Surface Ag: NONREACTIVE

## 2021-02-04 LAB — POCT HEMOGLOBIN-HEMACUE: Hemoglobin: 10.2 g/dL — ABNORMAL LOW (ref 13.0–17.0)

## 2021-02-04 LAB — VITAMIN D 25 HYDROXY (VIT D DEFICIENCY, FRACTURES): Vit D, 25-Hydroxy: 37.51 ng/mL (ref 30–100)

## 2021-02-04 MED ORDER — EPOETIN ALFA-EPBX 3000 UNIT/ML IJ SOLN: 3000.0000 [IU] | Freq: Once | INTRAMUSCULAR | Status: DC

## 2021-02-05 LAB — HEPATITIS B SURFACE ANTIBODY, QUANTITATIVE: Hep B S AB Quant (Post): 87.1 m[IU]/mL (ref >9.9)

## 2021-02-05 LAB — C4 COMPLEMENT: Complement C4, Body Fluid: 22 mg/dL (ref 12–38)

## 2021-02-05 LAB — ANA: Anti Nuclear Antibody (ANA): POSITIVE — AB

## 2021-02-05 LAB — PARATHYROID HORMONE, INTACT (NO CA): PTH: 146 pg/mL — ABNORMAL HIGH (ref 15–65)

## 2021-02-05 LAB — C3 COMPLEMENT: C3 Complement: 108 mg/dL (ref 82–167)

## 2021-02-12 DIAGNOSIS — E1122 Type 2 diabetes mellitus with diabetic chronic kidney disease: Secondary | ICD-10-CM | POA: Diagnosis not present

## 2021-02-12 DIAGNOSIS — N1832 Chronic kidney disease, stage 3b: Secondary | ICD-10-CM | POA: Diagnosis not present

## 2021-02-12 DIAGNOSIS — I129 Hypertensive chronic kidney disease with stage 1 through stage 4 chronic kidney disease, or unspecified chronic kidney disease: Secondary | ICD-10-CM | POA: Diagnosis not present

## 2021-02-18 ENCOUNTER — Other Ambulatory Visit: Payer: Self-pay

## 2021-02-18 ENCOUNTER — Encounter (HOSPITAL_COMMUNITY)
Admission: RE | Admit: 2021-02-18 | Discharge: 2021-02-18 | Disposition: A | Discharge status: Home / Self Care | Payer: Medicare Other | Source: Ambulatory Visit | Attending: Nephrology | Admitting: Nephrology

## 2021-02-18 DIAGNOSIS — D631 Anemia in chronic kidney disease: Secondary | ICD-10-CM | POA: Diagnosis not present

## 2021-02-18 DIAGNOSIS — N184 Chronic kidney disease, stage 4 (severe): Secondary | ICD-10-CM | POA: Diagnosis not present

## 2021-02-18 LAB — POCT HEMOGLOBIN-HEMACUE: Hemoglobin: 9.7 g/dL — ABNORMAL LOW (ref 13.0–17.0)

## 2021-02-18 MED ORDER — EPOETIN ALFA-EPBX 3000 UNIT/ML IJ SOLN
3000.0000 [IU] | Freq: Once | INTRAMUSCULAR | Status: AC
  Administered 2021-02-18: 3000 [IU] via SUBCUTANEOUS
  Filled 2021-02-18: qty 1

## 2021-03-04 ENCOUNTER — Encounter (HOSPITAL_COMMUNITY)
Admission: RE | Admit: 2021-03-04 | Discharge: 2021-03-04 | Disposition: A | Discharge status: Home / Self Care | Payer: Medicare Other | Source: Ambulatory Visit | Attending: Nephrology | Admitting: Nephrology

## 2021-03-04 ENCOUNTER — Other Ambulatory Visit: Payer: Self-pay

## 2021-03-04 ENCOUNTER — Encounter (HOSPITAL_COMMUNITY): Payer: Self-pay

## 2021-03-04 DIAGNOSIS — I129 Hypertensive chronic kidney disease with stage 1 through stage 4 chronic kidney disease, or unspecified chronic kidney disease: Secondary | ICD-10-CM | POA: Diagnosis not present

## 2021-03-04 DIAGNOSIS — E1122 Type 2 diabetes mellitus with diabetic chronic kidney disease: Secondary | ICD-10-CM | POA: Diagnosis not present

## 2021-03-04 DIAGNOSIS — N185 Chronic kidney disease, stage 5: Secondary | ICD-10-CM | POA: Diagnosis not present

## 2021-03-04 DIAGNOSIS — E211 Secondary hyperparathyroidism, not elsewhere classified: Secondary | ICD-10-CM | POA: Diagnosis not present

## 2021-03-04 DIAGNOSIS — D638 Anemia in other chronic diseases classified elsewhere: Secondary | ICD-10-CM | POA: Diagnosis not present

## 2021-03-04 DIAGNOSIS — D631 Anemia in chronic kidney disease: Secondary | ICD-10-CM | POA: Diagnosis not present

## 2021-03-04 DIAGNOSIS — E875 Hyperkalemia: Secondary | ICD-10-CM | POA: Diagnosis not present

## 2021-03-04 DIAGNOSIS — N184 Chronic kidney disease, stage 4 (severe): Secondary | ICD-10-CM | POA: Diagnosis not present

## 2021-03-04 DIAGNOSIS — E8722 Chronic metabolic acidosis: Secondary | ICD-10-CM | POA: Diagnosis not present

## 2021-03-04 LAB — RENAL FUNCTION PANEL
Albumin: 4 g/dL (ref 3.5–5.0)
Anion gap: 8 (ref 5–15)
BUN: 50 mg/dL — ABNORMAL HIGH (ref 8–23)
CO2: 21 mmol/L — ABNORMAL LOW (ref 22–32)
Calcium: 8.1 mg/dL — ABNORMAL LOW (ref 8.9–10.3)
Chloride: 106 mmol/L (ref 98–111)
Creatinine, Ser: 3.95 mg/dL — ABNORMAL HIGH (ref 0.61–1.24)
GFR, Estimated: 14 mL/min — ABNORMAL LOW (ref >60)
Glucose, Bld: 147 mg/dL — ABNORMAL HIGH (ref 70–99)
Phosphorus: 3.8 mg/dL (ref 2.5–4.6)
Potassium: 5.5 mmol/L — ABNORMAL HIGH (ref 3.5–5.1)
Sodium: 135 mmol/L (ref 135–145)

## 2021-03-04 LAB — CBC WITH DIFFERENTIAL/PLATELET
Abs Immature Granulocytes: 0.02 10*3/uL (ref 0.00–0.07)
Basophils Absolute: 0.1 10*3/uL (ref 0.0–0.1)
Basophils Relative: 1 %
Eosinophils Absolute: 0.3 10*3/uL (ref 0.0–0.5)
Eosinophils Relative: 5 %
HCT: 31.5 % — ABNORMAL LOW (ref 39.0–52.0)
Hemoglobin: 10.2 g/dL — ABNORMAL LOW (ref 13.0–17.0)
Immature Granulocytes: 0 %
Lymphocytes Relative: 26 %
Lymphs Abs: 1.6 10*3/uL (ref 0.7–4.0)
MCH: 31 pg (ref 26.0–34.0)
MCHC: 32.4 g/dL (ref 30.0–36.0)
MCV: 95.7 fL (ref 80.0–100.0)
Monocytes Absolute: 0.5 10*3/uL (ref 0.1–1.0)
Monocytes Relative: 9 %
Neutro Abs: 3.5 10*3/uL (ref 1.7–7.7)
Neutrophils Relative %: 59 %
Platelets: 295 10*3/uL (ref 150–400)
RBC: 3.29 MIL/uL — ABNORMAL LOW (ref 4.22–5.81)
RDW: 13.4 % (ref 11.5–15.5)
WBC: 6 10*3/uL (ref 4.0–10.5)
nRBC: 0 % (ref 0.0–0.2)

## 2021-03-04 LAB — POCT HEMOGLOBIN-HEMACUE: Hemoglobin: 10.1 g/dL — ABNORMAL LOW (ref 13.0–17.0)

## 2021-03-04 LAB — IRON AND TIBC
Iron: 61 ug/dL (ref 45–182)
Saturation Ratios: 24 % (ref 17.9–39.5)
TIBC: 254 ug/dL (ref 250–450)
UIBC: 193 ug/dL

## 2021-03-04 LAB — FERRITIN: Ferritin: 48 ng/mL (ref 24–336)

## 2021-03-04 LAB — VITAMIN D 25 HYDROXY (VIT D DEFICIENCY, FRACTURES): Vit D, 25-Hydroxy: 50.31 ng/mL (ref 30–100)

## 2021-03-04 MED ORDER — EPOETIN ALFA-EPBX 3000 UNIT/ML IJ SOLN: 3000.0000 [IU] | Freq: Once | INTRAMUSCULAR | Status: DC

## 2021-03-05 LAB — PTH, INTACT AND CALCIUM
Calcium, Total (PTH): 8.5 mg/dL — ABNORMAL LOW (ref 8.6–10.2)
PTH: 162 pg/mL — ABNORMAL HIGH (ref 15–65)

## 2021-03-14 DIAGNOSIS — N1831 Chronic kidney disease, stage 3a: Secondary | ICD-10-CM | POA: Diagnosis not present

## 2021-03-14 DIAGNOSIS — I129 Hypertensive chronic kidney disease with stage 1 through stage 4 chronic kidney disease, or unspecified chronic kidney disease: Secondary | ICD-10-CM | POA: Diagnosis not present

## 2021-03-14 DIAGNOSIS — E1122 Type 2 diabetes mellitus with diabetic chronic kidney disease: Secondary | ICD-10-CM | POA: Diagnosis not present

## 2021-03-14 DIAGNOSIS — J449 Chronic obstructive pulmonary disease, unspecified: Secondary | ICD-10-CM | POA: Diagnosis not present

## 2021-03-18 ENCOUNTER — Encounter (HOSPITAL_COMMUNITY)
Admission: RE | Admit: 2021-03-18 | Discharge: 2021-03-18 | Disposition: A | Discharge status: Home / Self Care | Payer: Medicare Other | Source: Ambulatory Visit | Attending: Nephrology | Admitting: Nephrology

## 2021-03-18 ENCOUNTER — Encounter (HOSPITAL_COMMUNITY): Payer: Self-pay

## 2021-03-18 DIAGNOSIS — D631 Anemia in chronic kidney disease: Secondary | ICD-10-CM | POA: Insufficient documentation

## 2021-03-18 DIAGNOSIS — N184 Chronic kidney disease, stage 4 (severe): Secondary | ICD-10-CM | POA: Insufficient documentation

## 2021-03-18 LAB — POCT HEMOGLOBIN-HEMACUE: Hemoglobin: 9.8 g/dL — ABNORMAL LOW (ref 13.0–17.0)

## 2021-03-18 MED ORDER — EPOETIN ALFA-EPBX 3000 UNIT/ML IJ SOLN
INTRAMUSCULAR | Status: AC
  Filled 2021-03-18: qty 1

## 2021-03-18 MED ORDER — EPOETIN ALFA-EPBX 3000 UNIT/ML IJ SOLN
3000.0000 [IU] | Freq: Once | INTRAMUSCULAR | Status: AC
  Administered 2021-03-18: 3000 [IU] via SUBCUTANEOUS

## 2021-04-01 ENCOUNTER — Encounter (HOSPITAL_COMMUNITY)
Admission: RE | Admit: 2021-04-01 | Discharge: 2021-04-01 | Disposition: A | Discharge status: Home / Self Care | Payer: Medicare Other | Source: Ambulatory Visit | Attending: Nephrology | Admitting: Nephrology

## 2021-04-01 ENCOUNTER — Encounter (HOSPITAL_COMMUNITY): Payer: Self-pay

## 2021-04-01 ENCOUNTER — Other Ambulatory Visit: Payer: Self-pay

## 2021-04-01 DIAGNOSIS — N184 Chronic kidney disease, stage 4 (severe): Secondary | ICD-10-CM | POA: Diagnosis not present

## 2021-04-01 DIAGNOSIS — D631 Anemia in chronic kidney disease: Secondary | ICD-10-CM | POA: Diagnosis not present

## 2021-04-01 LAB — POCT HEMOGLOBIN-HEMACUE: Hemoglobin: 10.3 g/dL — ABNORMAL LOW (ref 13.0–17.0)

## 2021-04-01 MED ORDER — EPOETIN ALFA-EPBX 40000 UNIT/ML IJ SOLN: 30000.0000 [IU] | Freq: Once | INTRAMUSCULAR | Status: DC

## 2021-04-08 DIAGNOSIS — E1122 Type 2 diabetes mellitus with diabetic chronic kidney disease: Secondary | ICD-10-CM | POA: Diagnosis not present

## 2021-04-08 DIAGNOSIS — N184 Chronic kidney disease, stage 4 (severe): Secondary | ICD-10-CM | POA: Diagnosis not present

## 2021-04-08 DIAGNOSIS — J449 Chronic obstructive pulmonary disease, unspecified: Secondary | ICD-10-CM | POA: Diagnosis not present

## 2021-04-08 DIAGNOSIS — I10 Essential (primary) hypertension: Secondary | ICD-10-CM | POA: Diagnosis not present

## 2021-04-13 DIAGNOSIS — E1122 Type 2 diabetes mellitus with diabetic chronic kidney disease: Secondary | ICD-10-CM | POA: Diagnosis not present

## 2021-04-13 DIAGNOSIS — J449 Chronic obstructive pulmonary disease, unspecified: Secondary | ICD-10-CM | POA: Diagnosis not present

## 2021-04-13 DIAGNOSIS — I129 Hypertensive chronic kidney disease with stage 1 through stage 4 chronic kidney disease, or unspecified chronic kidney disease: Secondary | ICD-10-CM | POA: Diagnosis not present

## 2021-04-13 DIAGNOSIS — N1831 Chronic kidney disease, stage 3a: Secondary | ICD-10-CM | POA: Diagnosis not present

## 2021-04-15 ENCOUNTER — Other Ambulatory Visit: Payer: Self-pay

## 2021-04-15 ENCOUNTER — Encounter (HOSPITAL_COMMUNITY)
Admission: RE | Admit: 2021-04-15 | Discharge: 2021-04-15 | Disposition: A | Discharge status: Home / Self Care | Payer: Medicare Other | Source: Ambulatory Visit | Attending: Nephrology | Admitting: Nephrology

## 2021-04-15 DIAGNOSIS — E875 Hyperkalemia: Secondary | ICD-10-CM | POA: Diagnosis not present

## 2021-04-15 DIAGNOSIS — D631 Anemia in chronic kidney disease: Secondary | ICD-10-CM | POA: Diagnosis not present

## 2021-04-15 DIAGNOSIS — N185 Chronic kidney disease, stage 5: Secondary | ICD-10-CM | POA: Diagnosis not present

## 2021-04-15 DIAGNOSIS — D638 Anemia in other chronic diseases classified elsewhere: Secondary | ICD-10-CM | POA: Diagnosis not present

## 2021-04-15 DIAGNOSIS — E1122 Type 2 diabetes mellitus with diabetic chronic kidney disease: Secondary | ICD-10-CM | POA: Diagnosis not present

## 2021-04-15 DIAGNOSIS — E211 Secondary hyperparathyroidism, not elsewhere classified: Secondary | ICD-10-CM | POA: Diagnosis not present

## 2021-04-15 DIAGNOSIS — N184 Chronic kidney disease, stage 4 (severe): Secondary | ICD-10-CM | POA: Diagnosis not present

## 2021-04-15 DIAGNOSIS — I129 Hypertensive chronic kidney disease with stage 1 through stage 4 chronic kidney disease, or unspecified chronic kidney disease: Secondary | ICD-10-CM | POA: Diagnosis not present

## 2021-04-15 LAB — CBC WITH DIFFERENTIAL/PLATELET
Abs Immature Granulocytes: 0.03 10*3/uL (ref 0.00–0.07)
Basophils Absolute: 0 10*3/uL (ref 0.0–0.1)
Basophils Relative: 0 %
Eosinophils Absolute: 0.2 10*3/uL (ref 0.0–0.5)
Eosinophils Relative: 3 %
HCT: 30.5 % — ABNORMAL LOW (ref 39.0–52.0)
Hemoglobin: 9.5 g/dL — ABNORMAL LOW (ref 13.0–17.0)
Immature Granulocytes: 0 %
Lymphocytes Relative: 25 %
Lymphs Abs: 1.8 10*3/uL (ref 0.7–4.0)
MCH: 29.5 pg (ref 26.0–34.0)
MCHC: 31.1 g/dL (ref 30.0–36.0)
MCV: 94.7 fL (ref 80.0–100.0)
Monocytes Absolute: 0.7 10*3/uL (ref 0.1–1.0)
Monocytes Relative: 9 %
Neutro Abs: 4.5 10*3/uL (ref 1.7–7.7)
Neutrophils Relative %: 63 %
Platelets: 281 10*3/uL (ref 150–400)
RBC: 3.22 MIL/uL — ABNORMAL LOW (ref 4.22–5.81)
RDW: 13.4 % (ref 11.5–15.5)
WBC: 7.4 10*3/uL (ref 4.0–10.5)
nRBC: 0 % (ref 0.0–0.2)

## 2021-04-15 LAB — RENAL FUNCTION PANEL
Albumin: 3.9 g/dL (ref 3.5–5.0)
Anion gap: 8 (ref 5–15)
BUN: 52 mg/dL — ABNORMAL HIGH (ref 8–23)
CO2: 24 mmol/L (ref 22–32)
Calcium: 7.9 mg/dL — ABNORMAL LOW (ref 8.9–10.3)
Chloride: 103 mmol/L (ref 98–111)
Creatinine, Ser: 4.02 mg/dL — ABNORMAL HIGH (ref 0.61–1.24)
GFR, Estimated: 14 mL/min — ABNORMAL LOW (ref >60)
Glucose, Bld: 178 mg/dL — ABNORMAL HIGH (ref 70–99)
Phosphorus: 4.4 mg/dL (ref 2.5–4.6)
Potassium: 4.5 mmol/L (ref 3.5–5.1)
Sodium: 135 mmol/L (ref 135–145)

## 2021-04-15 LAB — IRON AND TIBC
Iron: 47 ug/dL (ref 45–182)
Saturation Ratios: 18 % (ref 17.9–39.5)
TIBC: 256 ug/dL (ref 250–450)
UIBC: 209 ug/dL

## 2021-04-15 LAB — FERRITIN: Ferritin: 51 ng/mL (ref 24–336)

## 2021-04-15 LAB — PROTEIN / CREATININE RATIO, URINE
Creatinine, Urine: 172.46 mg/dL
Protein Creatinine Ratio: 0.21 mg/mg{Cre} — ABNORMAL HIGH (ref 0.00–0.15)
Total Protein, Urine: 37 mg/dL

## 2021-04-15 LAB — POCT HEMOGLOBIN-HEMACUE: Hemoglobin: 9.7 g/dL — ABNORMAL LOW (ref 13.0–17.0)

## 2021-04-15 LAB — VITAMIN D 25 HYDROXY (VIT D DEFICIENCY, FRACTURES): Vit D, 25-Hydroxy: 37.48 ng/mL (ref 30–100)

## 2021-04-15 MED ORDER — EPOETIN ALFA-EPBX 3000 UNIT/ML IJ SOLN
INTRAMUSCULAR | Status: AC
  Filled 2021-04-15: qty 1

## 2021-04-15 MED ORDER — EPOETIN ALFA-EPBX 3000 UNIT/ML IJ SOLN
3000.0000 [IU] | Freq: Once | INTRAMUSCULAR | Status: AC
  Administered 2021-04-15: 3000 [IU] via SUBCUTANEOUS

## 2021-04-17 LAB — PTH, INTACT AND CALCIUM
Calcium, Total (PTH): 8.3 mg/dL — ABNORMAL LOW (ref 8.6–10.2)
PTH: 178 pg/mL — ABNORMAL HIGH (ref 15–65)

## 2021-04-29 ENCOUNTER — Ambulatory Visit (HOSPITAL_COMMUNITY): Payer: Medicare Other

## 2021-04-29 ENCOUNTER — Encounter (HOSPITAL_COMMUNITY)
Admission: RE | Admit: 2021-04-29 | Discharge: 2021-04-29 | Disposition: A | Discharge status: Home / Self Care | Payer: Medicare Other | Source: Ambulatory Visit | Attending: Nephrology | Admitting: Nephrology

## 2021-04-29 DIAGNOSIS — N184 Chronic kidney disease, stage 4 (severe): Secondary | ICD-10-CM | POA: Insufficient documentation

## 2021-04-29 DIAGNOSIS — D631 Anemia in chronic kidney disease: Secondary | ICD-10-CM | POA: Diagnosis not present

## 2021-04-29 LAB — POCT HEMOGLOBIN-HEMACUE: Hemoglobin: 9.6 g/dL — ABNORMAL LOW (ref 13.0–17.0)

## 2021-04-29 MED ORDER — EPOETIN ALFA-EPBX 3000 UNIT/ML IJ SOLN
INTRAMUSCULAR | Status: AC
  Filled 2021-04-29: qty 1

## 2021-04-29 MED ORDER — EPOETIN ALFA-EPBX 3000 UNIT/ML IJ SOLN
3000.0000 [IU] | Freq: Once | INTRAMUSCULAR | Status: AC
  Administered 2021-04-29: 3000 [IU] via SUBCUTANEOUS

## 2021-05-13 ENCOUNTER — Encounter (HOSPITAL_COMMUNITY)
Admission: RE | Admit: 2021-05-13 | Discharge: 2021-05-13 | Disposition: A | Discharge status: Home / Self Care | Payer: Medicare Other | Source: Ambulatory Visit | Attending: Nephrology | Admitting: Nephrology

## 2021-05-13 ENCOUNTER — Encounter (HOSPITAL_COMMUNITY): Payer: Self-pay

## 2021-05-13 DIAGNOSIS — N184 Chronic kidney disease, stage 4 (severe): Secondary | ICD-10-CM | POA: Diagnosis not present

## 2021-05-13 DIAGNOSIS — J449 Chronic obstructive pulmonary disease, unspecified: Secondary | ICD-10-CM | POA: Diagnosis not present

## 2021-05-13 DIAGNOSIS — N1831 Chronic kidney disease, stage 3a: Secondary | ICD-10-CM | POA: Diagnosis not present

## 2021-05-13 DIAGNOSIS — D631 Anemia in chronic kidney disease: Secondary | ICD-10-CM | POA: Diagnosis not present

## 2021-05-13 DIAGNOSIS — E1122 Type 2 diabetes mellitus with diabetic chronic kidney disease: Secondary | ICD-10-CM | POA: Diagnosis not present

## 2021-05-13 DIAGNOSIS — I129 Hypertensive chronic kidney disease with stage 1 through stage 4 chronic kidney disease, or unspecified chronic kidney disease: Secondary | ICD-10-CM | POA: Diagnosis not present

## 2021-05-13 LAB — CBC WITH DIFFERENTIAL/PLATELET
Abs Immature Granulocytes: 0.02 10*3/uL (ref 0.00–0.07)
Basophils Absolute: 0.1 10*3/uL (ref 0.0–0.1)
Basophils Relative: 1 %
Eosinophils Absolute: 0.3 10*3/uL (ref 0.0–0.5)
Eosinophils Relative: 4 %
HCT: 31 % — ABNORMAL LOW (ref 39.0–52.0)
Hemoglobin: 9.8 g/dL — ABNORMAL LOW (ref 13.0–17.0)
Immature Granulocytes: 0 %
Lymphocytes Relative: 30 %
Lymphs Abs: 2.1 10*3/uL (ref 0.7–4.0)
MCH: 29.8 pg (ref 26.0–34.0)
MCHC: 31.6 g/dL (ref 30.0–36.0)
MCV: 94.2 fL (ref 80.0–100.0)
Monocytes Absolute: 0.6 10*3/uL (ref 0.1–1.0)
Monocytes Relative: 9 %
Neutro Abs: 3.9 10*3/uL (ref 1.7–7.7)
Neutrophils Relative %: 56 %
Platelets: 260 10*3/uL (ref 150–400)
RBC: 3.29 MIL/uL — ABNORMAL LOW (ref 4.22–5.81)
RDW: 13.6 % (ref 11.5–15.5)
WBC: 6.9 10*3/uL (ref 4.0–10.5)
nRBC: 0 % (ref 0.0–0.2)

## 2021-05-13 LAB — RENAL FUNCTION PANEL
Albumin: 3.8 g/dL (ref 3.5–5.0)
Anion gap: 11 (ref 5–15)
BUN: 60 mg/dL — ABNORMAL HIGH (ref 8–23)
CO2: 22 mmol/L (ref 22–32)
Calcium: 8.3 mg/dL — ABNORMAL LOW (ref 8.9–10.3)
Chloride: 105 mmol/L (ref 98–111)
Creatinine, Ser: 4.01 mg/dL — ABNORMAL HIGH (ref 0.61–1.24)
GFR, Estimated: 14 mL/min — ABNORMAL LOW (ref >60)
Glucose, Bld: 171 mg/dL — ABNORMAL HIGH (ref 70–99)
Phosphorus: 3.8 mg/dL (ref 2.5–4.6)
Potassium: 4.5 mmol/L (ref 3.5–5.1)
Sodium: 138 mmol/L (ref 135–145)

## 2021-05-13 LAB — IRON AND TIBC
Iron: 89 ug/dL (ref 45–182)
Saturation Ratios: 34 % (ref 17.9–39.5)
TIBC: 262 ug/dL (ref 250–450)
UIBC: 173 ug/dL

## 2021-05-13 LAB — VITAMIN D 25 HYDROXY (VIT D DEFICIENCY, FRACTURES): Vit D, 25-Hydroxy: 27.85 ng/mL — ABNORMAL LOW (ref 30–100)

## 2021-05-13 LAB — POCT HEMOGLOBIN-HEMACUE: Hemoglobin: 10.1 g/dL — ABNORMAL LOW (ref 13.0–17.0)

## 2021-05-13 LAB — FERRITIN: Ferritin: 45 ng/mL (ref 24–336)

## 2021-05-13 MED ORDER — EPOETIN ALFA-EPBX 3000 UNIT/ML IJ SOLN: 3000.0000 [IU] | Freq: Once | INTRAMUSCULAR | Status: DC

## 2021-05-14 LAB — PTH, INTACT AND CALCIUM
Calcium, Total (PTH): 8.4 mg/dL — ABNORMAL LOW (ref 8.6–10.2)
PTH: 158 pg/mL — ABNORMAL HIGH (ref 15–65)

## 2021-05-27 ENCOUNTER — Encounter (HOSPITAL_COMMUNITY): Payer: Self-pay

## 2021-05-27 ENCOUNTER — Encounter (HOSPITAL_COMMUNITY)
Admission: RE | Admit: 2021-05-27 | Discharge: 2021-05-27 | Disposition: A | Discharge status: Home / Self Care | Payer: Medicare Other | Source: Ambulatory Visit | Attending: Nephrology | Admitting: Nephrology

## 2021-05-27 DIAGNOSIS — N184 Chronic kidney disease, stage 4 (severe): Secondary | ICD-10-CM | POA: Diagnosis not present

## 2021-05-27 DIAGNOSIS — D631 Anemia in chronic kidney disease: Secondary | ICD-10-CM | POA: Diagnosis not present

## 2021-05-27 LAB — PROTEIN / CREATININE RATIO, URINE
Creatinine, Urine: 189.04 mg/dL
Protein Creatinine Ratio: 0.25 mg/mg{Cre} — ABNORMAL HIGH (ref 0.00–0.15)
Total Protein, Urine: 47 mg/dL

## 2021-05-27 LAB — POCT HEMOGLOBIN-HEMACUE: Hemoglobin: 10.6 g/dL — ABNORMAL LOW (ref 13.0–17.0)

## 2021-05-27 MED ORDER — EPOETIN ALFA-EPBX 3000 UNIT/ML IJ SOLN: 3000.0000 [IU] | Freq: Once | INTRAMUSCULAR | Status: DC

## 2021-05-29 DIAGNOSIS — E1129 Type 2 diabetes mellitus with other diabetic kidney complication: Secondary | ICD-10-CM | POA: Diagnosis not present

## 2021-05-29 DIAGNOSIS — E8722 Chronic metabolic acidosis: Secondary | ICD-10-CM | POA: Diagnosis not present

## 2021-05-29 DIAGNOSIS — E1122 Type 2 diabetes mellitus with diabetic chronic kidney disease: Secondary | ICD-10-CM | POA: Diagnosis not present

## 2021-05-29 DIAGNOSIS — I129 Hypertensive chronic kidney disease with stage 1 through stage 4 chronic kidney disease, or unspecified chronic kidney disease: Secondary | ICD-10-CM | POA: Diagnosis not present

## 2021-05-29 DIAGNOSIS — E559 Vitamin D deficiency, unspecified: Secondary | ICD-10-CM | POA: Diagnosis not present

## 2021-05-29 DIAGNOSIS — R809 Proteinuria, unspecified: Secondary | ICD-10-CM | POA: Diagnosis not present

## 2021-05-29 DIAGNOSIS — E211 Secondary hyperparathyroidism, not elsewhere classified: Secondary | ICD-10-CM | POA: Diagnosis not present

## 2021-05-29 DIAGNOSIS — N185 Chronic kidney disease, stage 5: Secondary | ICD-10-CM | POA: Diagnosis not present

## 2021-05-29 DIAGNOSIS — D638 Anemia in other chronic diseases classified elsewhere: Secondary | ICD-10-CM | POA: Diagnosis not present

## 2021-06-10 ENCOUNTER — Encounter (HOSPITAL_COMMUNITY): Payer: Self-pay

## 2021-06-10 ENCOUNTER — Encounter (HOSPITAL_COMMUNITY)
Admission: RE | Admit: 2021-06-10 | Discharge: 2021-06-10 | Disposition: A | Discharge status: Home / Self Care | Payer: Medicare Other | Source: Ambulatory Visit | Attending: Nephrology | Admitting: Nephrology

## 2021-06-10 DIAGNOSIS — N184 Chronic kidney disease, stage 4 (severe): Secondary | ICD-10-CM | POA: Diagnosis not present

## 2021-06-10 DIAGNOSIS — D631 Anemia in chronic kidney disease: Secondary | ICD-10-CM | POA: Diagnosis not present

## 2021-06-10 LAB — POCT HEMOGLOBIN-HEMACUE: Hemoglobin: 10.4 g/dL — ABNORMAL LOW (ref 13.0–17.0)

## 2021-06-10 MED ORDER — EPOETIN ALFA-EPBX 3000 UNIT/ML IJ SOLN: 3000.0000 [IU] | Freq: Once | INTRAMUSCULAR | Status: DC

## 2021-06-13 DIAGNOSIS — E1122 Type 2 diabetes mellitus with diabetic chronic kidney disease: Secondary | ICD-10-CM | POA: Diagnosis not present

## 2021-06-13 DIAGNOSIS — I129 Hypertensive chronic kidney disease with stage 1 through stage 4 chronic kidney disease, or unspecified chronic kidney disease: Secondary | ICD-10-CM | POA: Diagnosis not present

## 2021-06-24 ENCOUNTER — Encounter (HOSPITAL_COMMUNITY)
Admission: RE | Admit: 2021-06-24 | Discharge: 2021-06-24 | Disposition: A | Discharge status: Home / Self Care | Payer: Medicare Other | Source: Ambulatory Visit | Attending: Nephrology | Admitting: Nephrology

## 2021-06-24 DIAGNOSIS — D631 Anemia in chronic kidney disease: Secondary | ICD-10-CM | POA: Insufficient documentation

## 2021-06-24 DIAGNOSIS — N184 Chronic kidney disease, stage 4 (severe): Secondary | ICD-10-CM | POA: Insufficient documentation

## 2021-06-24 LAB — POCT HEMOGLOBIN-HEMACUE: Hemoglobin: 9.5 g/dL — ABNORMAL LOW (ref 13.0–17.0)

## 2021-06-24 MED ORDER — EPOETIN ALFA-EPBX 3000 UNIT/ML IJ SOLN
INTRAMUSCULAR | Status: AC
Start: 2021-06-24 — End: 2021-06-24
  Administered 2021-06-24: 3000 [IU]
  Filled 2021-06-24: qty 1

## 2021-06-24 MED ORDER — EPOETIN ALFA-EPBX 3000 UNIT/ML IJ SOLN: 3000.0000 [IU] | Freq: Once | INTRAMUSCULAR | Status: AC

## 2021-07-08 ENCOUNTER — Encounter (HOSPITAL_COMMUNITY)
Admission: RE | Admit: 2021-07-08 | Discharge: 2021-07-08 | Disposition: A | Discharge status: Home / Self Care | Payer: Medicare Other | Source: Ambulatory Visit | Attending: Nephrology | Admitting: Nephrology

## 2021-07-08 ENCOUNTER — Encounter (HOSPITAL_COMMUNITY): Payer: Self-pay

## 2021-07-08 DIAGNOSIS — N184 Chronic kidney disease, stage 4 (severe): Secondary | ICD-10-CM | POA: Diagnosis not present

## 2021-07-08 DIAGNOSIS — D631 Anemia in chronic kidney disease: Secondary | ICD-10-CM | POA: Diagnosis not present

## 2021-07-08 LAB — POCT HEMOGLOBIN-HEMACUE: Hemoglobin: 10.9 g/dL — ABNORMAL LOW (ref 13.0–17.0)

## 2021-07-08 MED ORDER — EPOETIN ALFA-EPBX 3000 UNIT/ML IJ SOLN: 3000.0000 [IU] | Freq: Once | INTRAMUSCULAR | Status: DC

## 2021-07-17 ENCOUNTER — Other Ambulatory Visit (HOSPITAL_COMMUNITY)
Admission: RE | Admit: 2021-07-17 | Discharge: 2021-07-17 | Disposition: A | Discharge status: Home / Self Care | Payer: Medicare Other | Source: Ambulatory Visit | Attending: Nephrology | Admitting: Nephrology

## 2021-07-17 DIAGNOSIS — D638 Anemia in other chronic diseases classified elsewhere: Secondary | ICD-10-CM | POA: Diagnosis not present

## 2021-07-17 DIAGNOSIS — I129 Hypertensive chronic kidney disease with stage 1 through stage 4 chronic kidney disease, or unspecified chronic kidney disease: Secondary | ICD-10-CM | POA: Diagnosis not present

## 2021-07-17 DIAGNOSIS — E8722 Chronic metabolic acidosis: Secondary | ICD-10-CM | POA: Diagnosis not present

## 2021-07-17 DIAGNOSIS — E211 Secondary hyperparathyroidism, not elsewhere classified: Secondary | ICD-10-CM | POA: Diagnosis not present

## 2021-07-17 DIAGNOSIS — N189 Chronic kidney disease, unspecified: Secondary | ICD-10-CM | POA: Diagnosis not present

## 2021-07-17 DIAGNOSIS — N185 Chronic kidney disease, stage 5: Secondary | ICD-10-CM | POA: Insufficient documentation

## 2021-07-17 DIAGNOSIS — R809 Proteinuria, unspecified: Secondary | ICD-10-CM | POA: Diagnosis not present

## 2021-07-17 DIAGNOSIS — E1122 Type 2 diabetes mellitus with diabetic chronic kidney disease: Secondary | ICD-10-CM | POA: Insufficient documentation

## 2021-07-17 DIAGNOSIS — E1129 Type 2 diabetes mellitus with other diabetic kidney complication: Secondary | ICD-10-CM | POA: Diagnosis not present

## 2021-07-17 DIAGNOSIS — D631 Anemia in chronic kidney disease: Secondary | ICD-10-CM | POA: Diagnosis not present

## 2021-07-17 LAB — PROTEIN / CREATININE RATIO, URINE
Creatinine, Urine: 72.79 mg/dL
Protein Creatinine Ratio: 0.32 mg/mg{Cre} — ABNORMAL HIGH (ref 0.00–0.15)
Total Protein, Urine: 23 mg/dL

## 2021-07-17 LAB — IRON AND TIBC
Iron: 80 ug/dL (ref 45–182)
Saturation Ratios: 34 % (ref 17.9–39.5)
TIBC: 237 ug/dL — ABNORMAL LOW (ref 250–450)
UIBC: 157 ug/dL

## 2021-07-17 LAB — RENAL FUNCTION PANEL
Albumin: 3.6 g/dL (ref 3.5–5.0)
Anion gap: 6 (ref 5–15)
BUN: 51 mg/dL — ABNORMAL HIGH (ref 8–23)
CO2: 25 mmol/L (ref 22–32)
Calcium: 8.2 mg/dL — ABNORMAL LOW (ref 8.9–10.3)
Chloride: 104 mmol/L (ref 98–111)
Creatinine, Ser: 3.45 mg/dL — ABNORMAL HIGH (ref 0.61–1.24)
GFR, Estimated: 16 mL/min — ABNORMAL LOW (ref >60)
Glucose, Bld: 180 mg/dL — ABNORMAL HIGH (ref 70–99)
Phosphorus: 3.6 mg/dL (ref 2.5–4.6)
Potassium: 4.7 mmol/L (ref 3.5–5.1)
Sodium: 135 mmol/L (ref 135–145)

## 2021-07-17 LAB — CBC
HCT: 28.8 % — ABNORMAL LOW (ref 39.0–52.0)
Hemoglobin: 9.1 g/dL — ABNORMAL LOW (ref 13.0–17.0)
MCH: 29.7 pg (ref 26.0–34.0)
MCHC: 31.6 g/dL (ref 30.0–36.0)
MCV: 94.1 fL (ref 80.0–100.0)
Platelets: 265 10*3/uL (ref 150–400)
RBC: 3.06 MIL/uL — ABNORMAL LOW (ref 4.22–5.81)
RDW: 13.2 % (ref 11.5–15.5)
WBC: 5.6 10*3/uL (ref 4.0–10.5)
nRBC: 0 % (ref 0.0–0.2)

## 2021-07-18 LAB — PTH, INTACT AND CALCIUM
Calcium, Total (PTH): 8.6 mg/dL (ref 8.6–10.2)
PTH: 145 pg/mL — ABNORMAL HIGH (ref 15–65)

## 2021-07-22 ENCOUNTER — Encounter (HOSPITAL_COMMUNITY)
Admission: RE | Admit: 2021-07-22 | Discharge: 2021-07-22 | Disposition: A | Discharge status: Home / Self Care | Payer: Medicare Other | Source: Ambulatory Visit | Attending: Nephrology | Admitting: Nephrology

## 2021-07-22 ENCOUNTER — Encounter (HOSPITAL_COMMUNITY): Payer: Self-pay

## 2021-07-22 DIAGNOSIS — D631 Anemia in chronic kidney disease: Secondary | ICD-10-CM | POA: Diagnosis not present

## 2021-07-22 DIAGNOSIS — N184 Chronic kidney disease, stage 4 (severe): Secondary | ICD-10-CM | POA: Diagnosis not present

## 2021-07-22 LAB — POCT HEMOGLOBIN-HEMACUE: Hemoglobin: 9.7 g/dL — ABNORMAL LOW (ref 13.0–17.0)

## 2021-07-22 MED ORDER — EPOETIN ALFA-EPBX 3000 UNIT/ML IJ SOLN
3000.0000 [IU] | Freq: Once | INTRAMUSCULAR | Status: AC
  Administered 2021-07-22: 3000 [IU] via SUBCUTANEOUS

## 2021-07-22 MED ORDER — EPOETIN ALFA-EPBX 3000 UNIT/ML IJ SOLN
INTRAMUSCULAR | Status: AC
  Filled 2021-07-22: qty 1

## 2021-08-05 ENCOUNTER — Encounter (HOSPITAL_COMMUNITY)
Admission: RE | Admit: 2021-08-05 | Discharge: 2021-08-05 | Disposition: A | Discharge status: Home / Self Care | Payer: Medicare Other | Source: Ambulatory Visit | Attending: Nephrology | Admitting: Nephrology

## 2021-08-05 DIAGNOSIS — N184 Chronic kidney disease, stage 4 (severe): Secondary | ICD-10-CM | POA: Diagnosis not present

## 2021-08-05 DIAGNOSIS — N179 Acute kidney failure, unspecified: Secondary | ICD-10-CM

## 2021-08-05 DIAGNOSIS — D631 Anemia in chronic kidney disease: Secondary | ICD-10-CM | POA: Diagnosis not present

## 2021-08-05 LAB — POCT HEMOGLOBIN-HEMACUE: Hemoglobin: 9.6 g/dL — ABNORMAL LOW (ref 13.0–17.0)

## 2021-08-05 MED ORDER — EPOETIN ALFA-EPBX 3000 UNIT/ML IJ SOLN: 3000.0000 [IU] | Freq: Once | INTRAMUSCULAR | Status: AC

## 2021-08-05 MED ORDER — EPOETIN ALFA-EPBX 2000 UNIT/ML IJ SOLN
INTRAMUSCULAR | Status: AC
  Administered 2021-08-05: 3000 [IU]
  Filled 2021-08-05: qty 2

## 2021-08-13 DIAGNOSIS — I129 Hypertensive chronic kidney disease with stage 1 through stage 4 chronic kidney disease, or unspecified chronic kidney disease: Secondary | ICD-10-CM | POA: Diagnosis not present

## 2021-08-13 DIAGNOSIS — E1122 Type 2 diabetes mellitus with diabetic chronic kidney disease: Secondary | ICD-10-CM | POA: Diagnosis not present

## 2021-08-17 ENCOUNTER — Encounter (HOSPITAL_COMMUNITY): Payer: Self-pay

## 2021-08-17 ENCOUNTER — Other Ambulatory Visit: Payer: Self-pay

## 2021-08-17 ENCOUNTER — Emergency Department (HOSPITAL_COMMUNITY): Payer: Medicare Other

## 2021-08-17 ENCOUNTER — Emergency Department (HOSPITAL_COMMUNITY)
Admission: EM | Admit: 2021-08-17 | Discharge: 2021-08-17 | Disposition: A | Discharge status: Home / Self Care | Payer: Medicare Other | Attending: Emergency Medicine | Admitting: Emergency Medicine

## 2021-08-17 DIAGNOSIS — R519 Headache, unspecified: Secondary | ICD-10-CM | POA: Diagnosis not present

## 2021-08-17 DIAGNOSIS — Z79899 Other long term (current) drug therapy: Secondary | ICD-10-CM | POA: Diagnosis not present

## 2021-08-17 DIAGNOSIS — Z7984 Long term (current) use of oral hypoglycemic drugs: Secondary | ICD-10-CM | POA: Diagnosis not present

## 2021-08-17 DIAGNOSIS — Z7982 Long term (current) use of aspirin: Secondary | ICD-10-CM | POA: Insufficient documentation

## 2021-08-17 DIAGNOSIS — E119 Type 2 diabetes mellitus without complications: Secondary | ICD-10-CM | POA: Diagnosis not present

## 2021-08-17 DIAGNOSIS — I10 Essential (primary) hypertension: Secondary | ICD-10-CM | POA: Insufficient documentation

## 2021-08-17 DIAGNOSIS — M2578 Osteophyte, vertebrae: Secondary | ICD-10-CM | POA: Diagnosis not present

## 2021-08-17 DIAGNOSIS — Y92410 Unspecified street and highway as the place of occurrence of the external cause: Secondary | ICD-10-CM | POA: Insufficient documentation

## 2021-08-17 DIAGNOSIS — Z041 Encounter for examination and observation following transport accident: Secondary | ICD-10-CM | POA: Diagnosis not present

## 2021-08-17 DIAGNOSIS — S161XXA Strain of muscle, fascia and tendon at neck level, initial encounter: Secondary | ICD-10-CM | POA: Insufficient documentation

## 2021-08-17 DIAGNOSIS — S169XXA Unspecified injury of muscle, fascia and tendon at neck level, initial encounter: Secondary | ICD-10-CM | POA: Diagnosis present

## 2021-08-17 DIAGNOSIS — S0990XA Unspecified injury of head, initial encounter: Secondary | ICD-10-CM | POA: Diagnosis not present

## 2021-08-17 MED ORDER — ACETAMINOPHEN 325 MG PO TABS
650.0000 mg | ORAL_TABLET | Freq: Once | ORAL | Status: AC
  Administered 2021-08-17: 650 mg via ORAL
  Filled 2021-08-17: qty 2

## 2021-08-17 NOTE — ED Notes (Signed)
Registration is with pt at this time

## 2021-08-17 NOTE — Discharge Instructions (Signed)
Like we discussed, you continue to take Tylenol as needed for management of your pain.  Please follow the instructions on the bottle.  Please continue to monitor your symptoms closely.  If you develop any new or worsening symptoms whatsoever please come back to the emergency department.
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
## 2021-08-17 NOTE — ED Notes (Signed)
Patient transported to CT 
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
## 2021-08-18 DIAGNOSIS — R0789 Other chest pain: Secondary | ICD-10-CM | POA: Diagnosis not present

## 2021-08-18 DIAGNOSIS — K4090 Unilateral inguinal hernia, without obstruction or gangrene, not specified as recurrent: Secondary | ICD-10-CM | POA: Diagnosis not present

## 2021-08-18 DIAGNOSIS — S20213A Contusion of bilateral front wall of thorax, initial encounter: Secondary | ICD-10-CM | POA: Diagnosis not present

## 2021-08-19 ENCOUNTER — Encounter (HOSPITAL_COMMUNITY)
Admission: RE | Admit: 2021-08-19 | Discharge: 2021-08-19 | Disposition: A | Discharge status: Home / Self Care | Payer: Medicare Other | Source: Ambulatory Visit | Attending: Nephrology | Admitting: Nephrology

## 2021-08-19 ENCOUNTER — Encounter (HOSPITAL_COMMUNITY): Payer: Self-pay

## 2021-08-19 VITALS — BP 130/54 | HR 62 | Temp 98.4°F | Resp 18

## 2021-08-19 DIAGNOSIS — N184 Chronic kidney disease, stage 4 (severe): Secondary | ICD-10-CM | POA: Diagnosis not present

## 2021-08-19 DIAGNOSIS — N179 Acute kidney failure, unspecified: Secondary | ICD-10-CM | POA: Diagnosis not present

## 2021-08-19 LAB — POCT HEMOGLOBIN-HEMACUE: Hemoglobin: 9.8 g/dL — ABNORMAL LOW (ref 13.0–17.0)

## 2021-08-19 MED ORDER — EPOETIN ALFA-EPBX 3000 UNIT/ML IJ SOLN: 3000.0000 [IU] | Freq: Once | INTRAMUSCULAR | Status: AC

## 2021-08-19 MED ORDER — EPOETIN ALFA-EPBX 3000 UNIT/ML IJ SOLN
INTRAMUSCULAR | Status: AC
Start: 2021-08-19 — End: 2021-08-19
  Administered 2021-08-19: 3000 [IU] via SUBCUTANEOUS
  Filled 2021-08-19: qty 1

## 2021-08-25 DIAGNOSIS — S20213D Contusion of bilateral front wall of thorax, subsequent encounter: Secondary | ICD-10-CM | POA: Diagnosis not present

## 2021-08-25 DIAGNOSIS — R0789 Other chest pain: Secondary | ICD-10-CM | POA: Diagnosis not present

## 2021-08-25 DIAGNOSIS — Z0000 Encounter for general adult medical examination without abnormal findings: Secondary | ICD-10-CM | POA: Diagnosis not present

## 2021-09-02 ENCOUNTER — Encounter (HOSPITAL_COMMUNITY)
Admission: RE | Admit: 2021-09-02 | Discharge: 2021-09-02 | Disposition: A | Discharge status: Home / Self Care | Payer: Medicare Other | Source: Ambulatory Visit | Attending: Nephrology | Admitting: Nephrology

## 2021-09-02 VITALS — BP 148/72 | HR 60 | Temp 97.8°F | Resp 18 | Ht 73.0 in | Wt 189.0 lb

## 2021-09-02 DIAGNOSIS — N184 Chronic kidney disease, stage 4 (severe): Secondary | ICD-10-CM | POA: Diagnosis not present

## 2021-09-02 DIAGNOSIS — N179 Acute kidney failure, unspecified: Secondary | ICD-10-CM

## 2021-09-02 LAB — POCT HEMOGLOBIN-HEMACUE: Hemoglobin: 9.7 g/dL — ABNORMAL LOW (ref 13.0–17.0)

## 2021-09-02 MED ORDER — EPOETIN ALFA-EPBX 3000 UNIT/ML IJ SOLN
INTRAMUSCULAR | Status: AC
  Filled 2021-09-02: qty 1

## 2021-09-02 MED ORDER — EPOETIN ALFA-EPBX 3000 UNIT/ML IJ SOLN
3000.0000 [IU] | Freq: Once | INTRAMUSCULAR | Status: AC
  Administered 2021-09-02: 3000 [IU] via SUBCUTANEOUS

## 2021-09-12 DIAGNOSIS — E1122 Type 2 diabetes mellitus with diabetic chronic kidney disease: Secondary | ICD-10-CM | POA: Diagnosis not present

## 2021-09-12 DIAGNOSIS — I129 Hypertensive chronic kidney disease with stage 1 through stage 4 chronic kidney disease, or unspecified chronic kidney disease: Secondary | ICD-10-CM | POA: Diagnosis not present

## 2021-09-22 DIAGNOSIS — I10 Essential (primary) hypertension: Secondary | ICD-10-CM | POA: Diagnosis not present

## 2021-09-22 DIAGNOSIS — R079 Chest pain, unspecified: Secondary | ICD-10-CM | POA: Diagnosis not present

## 2021-09-22 DIAGNOSIS — S20213D Contusion of bilateral front wall of thorax, subsequent encounter: Secondary | ICD-10-CM | POA: Diagnosis not present

## 2021-09-23 ENCOUNTER — Encounter (HOSPITAL_COMMUNITY)
Admission: RE | Admit: 2021-09-23 | Discharge: 2021-09-23 | Disposition: A | Discharge status: Home / Self Care | Payer: Medicare Other | Source: Ambulatory Visit | Attending: Nephrology | Admitting: Nephrology

## 2021-09-23 VITALS — BP 131/54 | Temp 98.7°F

## 2021-09-23 DIAGNOSIS — D631 Anemia in chronic kidney disease: Secondary | ICD-10-CM | POA: Insufficient documentation

## 2021-09-23 DIAGNOSIS — N184 Chronic kidney disease, stage 4 (severe): Secondary | ICD-10-CM | POA: Insufficient documentation

## 2021-09-23 DIAGNOSIS — N179 Acute kidney failure, unspecified: Secondary | ICD-10-CM

## 2021-09-23 DIAGNOSIS — Z01812 Encounter for preprocedural laboratory examination: Secondary | ICD-10-CM | POA: Insufficient documentation

## 2021-09-23 LAB — POCT HEMOGLOBIN-HEMACUE: Hemoglobin: 9.7 g/dL — ABNORMAL LOW (ref 13.0–17.0)

## 2021-09-23 MED ORDER — EPOETIN ALFA-EPBX 3000 UNIT/ML IJ SOLN
INTRAMUSCULAR | Status: AC
  Filled 2021-09-23: qty 1

## 2021-09-23 MED ORDER — EPOETIN ALFA-EPBX 3000 UNIT/ML IJ SOLN
3000.0000 [IU] | Freq: Once | INTRAMUSCULAR | Status: AC
  Administered 2021-09-23: 3000 [IU] via SUBCUTANEOUS

## 2021-09-24 ENCOUNTER — Other Ambulatory Visit (HOSPITAL_COMMUNITY)
Admission: RE | Admit: 2021-09-24 | Discharge: 2021-09-24 | Disposition: A | Discharge status: Home / Self Care | Payer: Medicare Other | Source: Ambulatory Visit | Attending: Nephrology | Admitting: Nephrology

## 2021-09-24 DIAGNOSIS — E1129 Type 2 diabetes mellitus with other diabetic kidney complication: Secondary | ICD-10-CM | POA: Diagnosis not present

## 2021-09-24 DIAGNOSIS — E211 Secondary hyperparathyroidism, not elsewhere classified: Secondary | ICD-10-CM | POA: Diagnosis not present

## 2021-09-24 DIAGNOSIS — I129 Hypertensive chronic kidney disease with stage 1 through stage 4 chronic kidney disease, or unspecified chronic kidney disease: Secondary | ICD-10-CM | POA: Diagnosis not present

## 2021-09-24 DIAGNOSIS — N189 Chronic kidney disease, unspecified: Secondary | ICD-10-CM | POA: Diagnosis not present

## 2021-09-24 DIAGNOSIS — D638 Anemia in other chronic diseases classified elsewhere: Secondary | ICD-10-CM | POA: Diagnosis not present

## 2021-09-24 DIAGNOSIS — R809 Proteinuria, unspecified: Secondary | ICD-10-CM | POA: Diagnosis not present

## 2021-09-24 DIAGNOSIS — N185 Chronic kidney disease, stage 5: Secondary | ICD-10-CM | POA: Diagnosis not present

## 2021-09-24 DIAGNOSIS — E1122 Type 2 diabetes mellitus with diabetic chronic kidney disease: Secondary | ICD-10-CM | POA: Diagnosis not present

## 2021-09-24 LAB — RENAL FUNCTION PANEL
Albumin: 3.6 g/dL (ref 3.5–5.0)
Anion gap: 7 (ref 5–15)
BUN: 71 mg/dL — ABNORMAL HIGH (ref 8–23)
CO2: 25 mmol/L (ref 22–32)
Calcium: 8.5 mg/dL — ABNORMAL LOW (ref 8.9–10.3)
Chloride: 103 mmol/L (ref 98–111)
Creatinine, Ser: 4.13 mg/dL — ABNORMAL HIGH (ref 0.61–1.24)
GFR, Estimated: 13 mL/min — ABNORMAL LOW (ref >60)
Glucose, Bld: 168 mg/dL — ABNORMAL HIGH (ref 70–99)
Phosphorus: 3.2 mg/dL (ref 2.5–4.6)
Potassium: 4.5 mmol/L (ref 3.5–5.1)
Sodium: 135 mmol/L (ref 135–145)

## 2021-09-24 LAB — PROTEIN / CREATININE RATIO, URINE
Creatinine, Urine: 84.25 mg/dL
Protein Creatinine Ratio: 0.17 mg/mg{Cre} — ABNORMAL HIGH (ref 0.00–0.15)
Total Protein, Urine: 14 mg/dL

## 2021-09-24 LAB — CBC
HCT: 28.8 % — ABNORMAL LOW (ref 39.0–52.0)
Hemoglobin: 9.1 g/dL — ABNORMAL LOW (ref 13.0–17.0)
MCH: 30.1 pg (ref 26.0–34.0)
MCHC: 31.6 g/dL (ref 30.0–36.0)
MCV: 95.4 fL (ref 80.0–100.0)
Platelets: 245 10*3/uL (ref 150–400)
RBC: 3.02 MIL/uL — ABNORMAL LOW (ref 4.22–5.81)
RDW: 13.4 % (ref 11.5–15.5)
WBC: 6.8 10*3/uL (ref 4.0–10.5)
nRBC: 0 % (ref 0.0–0.2)

## 2021-09-25 LAB — PTH, INTACT AND CALCIUM
Calcium, Total (PTH): 8.8 mg/dL (ref 8.6–10.2)
PTH: 163 pg/mL — ABNORMAL HIGH (ref 15–65)

## 2021-10-07 ENCOUNTER — Encounter (HOSPITAL_COMMUNITY): Payer: Medicare Other

## 2021-10-09 ENCOUNTER — Encounter (HOSPITAL_COMMUNITY): Admission: RE | Admit: 2021-10-09 | Payer: Medicare Other | Source: Ambulatory Visit

## 2021-10-09 DIAGNOSIS — N184 Chronic kidney disease, stage 4 (severe): Secondary | ICD-10-CM | POA: Insufficient documentation

## 2021-10-09 DIAGNOSIS — N179 Acute kidney failure, unspecified: Secondary | ICD-10-CM

## 2021-10-10 ENCOUNTER — Other Ambulatory Visit (HOSPITAL_COMMUNITY)
Admission: RE | Admit: 2021-10-10 | Discharge: 2021-10-10 | Disposition: A | Discharge status: Home / Self Care | Payer: Medicare Other | Source: Ambulatory Visit | Attending: Nephrology | Admitting: Nephrology

## 2021-10-10 ENCOUNTER — Ambulatory Visit (HOSPITAL_COMMUNITY)
Admission: RE | Admit: 2021-10-10 | Discharge: 2021-10-10 | Disposition: A | Discharge status: Home / Self Care | Payer: Medicare Other | Source: Ambulatory Visit | Attending: Nephrology | Admitting: Nephrology

## 2021-10-10 DIAGNOSIS — N179 Acute kidney failure, unspecified: Secondary | ICD-10-CM | POA: Diagnosis not present

## 2021-10-10 DIAGNOSIS — Z01818 Encounter for other preprocedural examination: Secondary | ICD-10-CM | POA: Insufficient documentation

## 2021-10-10 LAB — POCT HEMOGLOBIN-HEMACUE: Hemoglobin: 10.4 g/dL — ABNORMAL LOW (ref 13.0–17.0)

## 2021-10-10 MED ORDER — EPOETIN ALFA-EPBX 3000 UNIT/ML IJ SOLN: 3000.0000 [IU] | Freq: Once | INTRAMUSCULAR | Status: DC

## 2021-10-23 ENCOUNTER — Encounter (HOSPITAL_COMMUNITY)
Admission: RE | Admit: 2021-10-23 | Discharge: 2021-10-23 | Disposition: A | Discharge status: Home / Self Care | Payer: Medicare Other | Source: Ambulatory Visit | Attending: Nephrology | Admitting: Nephrology

## 2021-10-23 VITALS — BP 140/60 | HR 62 | Temp 97.9°F | Resp 16

## 2021-10-23 DIAGNOSIS — N179 Acute kidney failure, unspecified: Secondary | ICD-10-CM | POA: Insufficient documentation

## 2021-10-23 DIAGNOSIS — N184 Chronic kidney disease, stage 4 (severe): Secondary | ICD-10-CM | POA: Diagnosis not present

## 2021-10-23 DIAGNOSIS — D631 Anemia in chronic kidney disease: Secondary | ICD-10-CM | POA: Insufficient documentation

## 2021-10-23 LAB — CBC WITH DIFFERENTIAL/PLATELET
Abs Immature Granulocytes: 0.01 10*3/uL (ref 0.00–0.07)
Basophils Absolute: 0.1 10*3/uL (ref 0.0–0.1)
Basophils Relative: 1 %
Eosinophils Absolute: 0.3 10*3/uL (ref 0.0–0.5)
Eosinophils Relative: 4 %
HCT: 30.2 % — ABNORMAL LOW (ref 39.0–52.0)
Hemoglobin: 9.6 g/dL — ABNORMAL LOW (ref 13.0–17.0)
Immature Granulocytes: 0 %
Lymphocytes Relative: 31 %
Lymphs Abs: 2.1 10*3/uL (ref 0.7–4.0)
MCH: 31.1 pg (ref 26.0–34.0)
MCHC: 31.8 g/dL (ref 30.0–36.0)
MCV: 97.7 fL (ref 80.0–100.0)
Monocytes Absolute: 0.7 10*3/uL (ref 0.1–1.0)
Monocytes Relative: 10 %
Neutro Abs: 3.5 10*3/uL (ref 1.7–7.7)
Neutrophils Relative %: 54 %
Platelets: 260 10*3/uL (ref 150–400)
RBC: 3.09 MIL/uL — ABNORMAL LOW (ref 4.22–5.81)
RDW: 13 % (ref 11.5–15.5)
WBC: 6.6 10*3/uL (ref 4.0–10.5)
nRBC: 0 % (ref 0.0–0.2)

## 2021-10-23 LAB — RENAL FUNCTION PANEL
Albumin: 3.4 g/dL — ABNORMAL LOW (ref 3.5–5.0)
Anion gap: 11 (ref 5–15)
BUN: 50 mg/dL — ABNORMAL HIGH (ref 8–23)
CO2: 17 mmol/L — ABNORMAL LOW (ref 22–32)
Calcium: 7.9 mg/dL — ABNORMAL LOW (ref 8.9–10.3)
Chloride: 105 mmol/L (ref 98–111)
Creatinine, Ser: 3.67 mg/dL — ABNORMAL HIGH (ref 0.61–1.24)
GFR, Estimated: 15 mL/min — ABNORMAL LOW (ref >60)
Glucose, Bld: 168 mg/dL — ABNORMAL HIGH (ref 70–99)
Phosphorus: 3.3 mg/dL (ref 2.5–4.6)
Potassium: 4.6 mmol/L (ref 3.5–5.1)
Sodium: 133 mmol/L — ABNORMAL LOW (ref 135–145)

## 2021-10-23 LAB — IRON AND TIBC
Iron: 90 ug/dL (ref 45–182)
Saturation Ratios: 36 % (ref 17.9–39.5)
TIBC: 249 ug/dL — ABNORMAL LOW (ref 250–450)
UIBC: 159 ug/dL

## 2021-10-23 LAB — FERRITIN: Ferritin: 56 ng/mL (ref 24–336)

## 2021-10-23 LAB — POCT HEMOGLOBIN-HEMACUE: Hemoglobin: 9.8 g/dL — ABNORMAL LOW (ref 13.0–17.0)

## 2021-10-23 MED ORDER — EPOETIN ALFA-EPBX 3000 UNIT/ML IJ SOLN
INTRAMUSCULAR | Status: AC
  Filled 2021-10-23: qty 1

## 2021-10-23 MED ORDER — EPOETIN ALFA-EPBX 3000 UNIT/ML IJ SOLN
3000.0000 [IU] | Freq: Once | INTRAMUSCULAR | Status: AC
  Administered 2021-10-23: 3000 [IU] via SUBCUTANEOUS

## 2021-10-25 LAB — PTH, INTACT AND CALCIUM
Calcium, Total (PTH): 8.3 mg/dL — ABNORMAL LOW (ref 8.6–10.2)
PTH: 32 pg/mL (ref 15–65)

## 2021-10-27 DIAGNOSIS — S20213D Contusion of bilateral front wall of thorax, subsequent encounter: Secondary | ICD-10-CM | POA: Diagnosis not present

## 2021-10-27 DIAGNOSIS — N189 Chronic kidney disease, unspecified: Secondary | ICD-10-CM | POA: Diagnosis not present

## 2021-10-27 DIAGNOSIS — I129 Hypertensive chronic kidney disease with stage 1 through stage 4 chronic kidney disease, or unspecified chronic kidney disease: Secondary | ICD-10-CM | POA: Diagnosis not present

## 2021-10-27 DIAGNOSIS — E1122 Type 2 diabetes mellitus with diabetic chronic kidney disease: Secondary | ICD-10-CM | POA: Diagnosis not present

## 2021-10-27 DIAGNOSIS — R0789 Other chest pain: Secondary | ICD-10-CM | POA: Diagnosis not present

## 2021-10-29 DIAGNOSIS — E211 Secondary hyperparathyroidism, not elsewhere classified: Secondary | ICD-10-CM | POA: Diagnosis not present

## 2021-10-29 DIAGNOSIS — D638 Anemia in other chronic diseases classified elsewhere: Secondary | ICD-10-CM | POA: Diagnosis not present

## 2021-10-29 DIAGNOSIS — E1122 Type 2 diabetes mellitus with diabetic chronic kidney disease: Secondary | ICD-10-CM | POA: Diagnosis not present

## 2021-10-29 DIAGNOSIS — N189 Chronic kidney disease, unspecified: Secondary | ICD-10-CM | POA: Diagnosis not present

## 2021-10-29 DIAGNOSIS — E8722 Chronic metabolic acidosis: Secondary | ICD-10-CM | POA: Diagnosis not present

## 2021-10-29 DIAGNOSIS — I129 Hypertensive chronic kidney disease with stage 1 through stage 4 chronic kidney disease, or unspecified chronic kidney disease: Secondary | ICD-10-CM | POA: Diagnosis not present

## 2021-10-29 DIAGNOSIS — E871 Hypo-osmolality and hyponatremia: Secondary | ICD-10-CM | POA: Diagnosis not present

## 2021-11-06 ENCOUNTER — Emergency Department (HOSPITAL_COMMUNITY)
Admission: EM | Admit: 2021-11-06 | Discharge: 2021-11-06 | Disposition: A | Discharge status: Home / Self Care | Payer: Medicare Other | Attending: Emergency Medicine | Admitting: Emergency Medicine

## 2021-11-06 ENCOUNTER — Encounter (HOSPITAL_COMMUNITY): Payer: Self-pay

## 2021-11-06 ENCOUNTER — Other Ambulatory Visit: Payer: Self-pay

## 2021-11-06 ENCOUNTER — Encounter (HOSPITAL_COMMUNITY)
Admission: RE | Admit: 2021-11-06 | Discharge: 2021-11-06 | Disposition: A | Discharge status: Home / Self Care | Payer: Medicare Other | Source: Ambulatory Visit | Attending: Nephrology | Admitting: Nephrology

## 2021-11-06 ENCOUNTER — Emergency Department (HOSPITAL_COMMUNITY): Payer: Medicare Other

## 2021-11-06 DIAGNOSIS — S0990XA Unspecified injury of head, initial encounter: Secondary | ICD-10-CM | POA: Diagnosis present

## 2021-11-06 DIAGNOSIS — S0181XA Laceration without foreign body of other part of head, initial encounter: Secondary | ICD-10-CM | POA: Insufficient documentation

## 2021-11-06 DIAGNOSIS — N179 Acute kidney failure, unspecified: Secondary | ICD-10-CM

## 2021-11-06 DIAGNOSIS — S199XXA Unspecified injury of neck, initial encounter: Secondary | ICD-10-CM | POA: Diagnosis not present

## 2021-11-06 DIAGNOSIS — Z79899 Other long term (current) drug therapy: Secondary | ICD-10-CM | POA: Insufficient documentation

## 2021-11-06 DIAGNOSIS — J449 Chronic obstructive pulmonary disease, unspecified: Secondary | ICD-10-CM | POA: Insufficient documentation

## 2021-11-06 DIAGNOSIS — I6529 Occlusion and stenosis of unspecified carotid artery: Secondary | ICD-10-CM | POA: Diagnosis not present

## 2021-11-06 DIAGNOSIS — Z7982 Long term (current) use of aspirin: Secondary | ICD-10-CM | POA: Diagnosis not present

## 2021-11-06 DIAGNOSIS — I10 Essential (primary) hypertension: Secondary | ICD-10-CM | POA: Diagnosis not present

## 2021-11-06 DIAGNOSIS — M4312 Spondylolisthesis, cervical region: Secondary | ICD-10-CM | POA: Diagnosis not present

## 2021-11-06 DIAGNOSIS — J439 Emphysema, unspecified: Secondary | ICD-10-CM | POA: Diagnosis not present

## 2021-11-06 DIAGNOSIS — W01198A Fall on same level from slipping, tripping and stumbling with subsequent striking against other object, initial encounter: Secondary | ICD-10-CM | POA: Diagnosis not present

## 2021-11-06 DIAGNOSIS — E119 Type 2 diabetes mellitus without complications: Secondary | ICD-10-CM | POA: Insufficient documentation

## 2021-11-06 DIAGNOSIS — Z7984 Long term (current) use of oral hypoglycemic drugs: Secondary | ICD-10-CM | POA: Insufficient documentation

## 2021-11-06 DIAGNOSIS — S0101XA Laceration without foreign body of scalp, initial encounter: Secondary | ICD-10-CM | POA: Diagnosis not present

## 2021-11-06 DIAGNOSIS — G319 Degenerative disease of nervous system, unspecified: Secondary | ICD-10-CM | POA: Diagnosis not present

## 2021-11-06 DIAGNOSIS — Y92009 Unspecified place in unspecified non-institutional (private) residence as the place of occurrence of the external cause: Secondary | ICD-10-CM | POA: Diagnosis not present

## 2021-11-06 DIAGNOSIS — W19XXXA Unspecified fall, initial encounter: Secondary | ICD-10-CM

## 2021-11-06 NOTE — ED Provider Notes (Signed)
[facility] Provider Note   CSN: [id number] Arrival date & time: 11/06/21  3329     History  Chief Complaint  Patient presents with   Nicholas Swanson    Nicholas Swanson is a 88 y.o. male.  Patient has a history of hypertension COPD and diabetes.  He slipped and fell and hit his head but no loss of consciousness  The history is provided by the patient and a relative. No language interpreter was used.  Fall This is a new problem. The current episode started 12 to 24 hours ago. The problem occurs rarely. The problem has been resolved. Pertinent negatives include no chest pain, no abdominal pain and no headaches. Nothing aggravates the symptoms. He has tried nothing for the symptoms. The treatment provided no relief.       Home Medications Prior to Admission medications   Medication Sig Start Date End Date Taking? Authorizing Provider  albuterol (PROVENTIL) (2.5 MG/3ML) 0.083% nebulizer solution Take 3 mLs (2.5 mg total) by nebulization every 6 (six) hours as needed for wheezing or shortness of breath. 03/20/19   Elodia Florence., MD  albuterol (VENTOLIN HFA) 108 (90 Base) MCG/ACT inhaler Inhale 1-2 puffs into the lungs every 6 (six) hours as needed for wheezing or shortness of breath.  02/20/19   [provider]  amLODipine (NORVASC) 10 MG tablet Take 10 mg by mouth daily. 02/06/19   [provider]  aspirin EC 81 MG tablet Take 81 mg by mouth daily.    [provider]  atorvastatin (LIPITOR) 40 MG tablet Take 1 tablet (40 mg total) by mouth daily. 03/20/19 04/19/19  Elodia Florence., MD  glipiZIDE (GLUCOTROL XL) 2.5 MG 24 hr tablet Take 2.5 mg by mouth daily.    [provider]  metFORMIN (GLUCOPHAGE) 1000 MG tablet Take 1,000 mg by mouth 2 (two) times daily with a meal.    [provider]  metoprolol succinate (TOPROL-XL) 50 MG 24 hr tablet Take 1 tablet (50 mg total) by mouth daily. 03/21/19 04/20/19  Elodia Florence.,  MD  sodium polystyrene (KAYEXALATE) powder Take 15 grams daily for 7 days.  Please follow up with your PCP for repeat labs once this is complete. 03/20/19   Elodia Florence., MD  terazosin (HYTRIN) 5 MG capsule Take 5 mg by mouth at bedtime.    [provider]      Allergies    Ace inhibitors and Simvastatin    Review of Systems   Review of Systems  Constitutional:  Negative for appetite change and fatigue.  HENT:  Negative for congestion, ear discharge and sinus pressure.        Headache  Eyes:  Negative for discharge.  Respiratory:  Negative for cough.   Cardiovascular:  Negative for chest pain.  Gastrointestinal:  Negative for abdominal pain and diarrhea.  Genitourinary:  Negative for frequency and hematuria.  Musculoskeletal:  Negative for back pain.  Skin:  Negative for rash.  Neurological:  Negative for seizures and headaches.  Psychiatric/Behavioral:  Negative for hallucinations.     Physical Exam Updated Vital Signs BP (!) 183/70   Pulse 66   Temp 97.8 F (36.6 C)   Resp 16   Ht '6\' 1"'$  (1.854 m)   Wt 81.6 kg   SpO2 99%   BMI 23.75 kg/m  Physical Exam Vitals and nursing note reviewed.  Constitutional:      Appearance: He is well-developed.  HENT:  Head: Normocephalic.     Comments: 3 cm laceration to forehead    Nose: Nose normal.  Eyes:     General: No scleral icterus.    Conjunctiva/sclera: Conjunctivae normal.  Neck:     Thyroid: No thyromegaly.  Cardiovascular:     Rate and Rhythm: Normal rate and regular rhythm.     Heart sounds: No murmur heard.    No friction rub. No gallop.  Pulmonary:     Breath sounds: No stridor. No wheezing or rales.  Chest:     Chest wall: No tenderness.  Abdominal:     General: There is no distension.     Tenderness: There is no abdominal tenderness. There is no rebound.  Musculoskeletal:        General: Normal range of motion.     Cervical back: Neck supple.  Lymphadenopathy:     Cervical: No  cervical adenopathy.  Skin:    Findings: No erythema or rash.  Neurological:     Mental Status: He is alert and oriented to person, place, and time.     Motor: No abnormal muscle tone.     Coordination: Coordination normal.  Psychiatric:        Behavior: Behavior normal.     ED Results / Procedures / Treatments   Labs (all labs ordered are listed, but only abnormal results are displayed) Labs Reviewed - No data to display  EKG None  Radiology CT Head Wo Contrast  Result Date: 11/06/2021 CLINICAL DATA:  Fall. EXAM: CT HEAD WITHOUT CONTRAST CT CERVICAL SPINE WITHOUT CONTRAST TECHNIQUE: Multidetector CT imaging of the head and cervical spine was performed following the standard protocol without intravenous contrast. Multiplanar CT image reconstructions of the cervical spine were also generated. RADIATION DOSE REDUCTION: This exam was performed according to the departmental dose-optimization program which includes automated exposure control, adjustment of the mA and/or kV according to patient size and/or use of iterative reconstruction technique. COMPARISON:  CT head and cervical spine dated August 17, 2021. FINDINGS: CT HEAD FINDINGS Brain: No evidence of acute infarction, hemorrhage, hydrocephalus, extra-axial collection or mass lesion/mass effect. Stable atrophy and chronic microvascular ischemic changes. Vascular: Atherosclerotic vascular calcification of the carotid siphons. No hyperdense vessel. Skull: Normal. Negative for fracture or focal lesion. Sinuses/Orbits: No acute finding. Other: Small frontal scalp laceration and hematoma. CT CERVICAL SPINE FINDINGS Alignment: No traumatic malalignment. Unchanged trace anterolisthesis at C5-C6. Skull base and vertebrae: No acute fracture. No primary bone lesion or focal pathologic process. Soft tissues and spinal canal: No prevertebral fluid or swelling. No visible canal hematoma. Disc levels: Similar mild-to-moderate multilevel degenerative changes.  Upper chest: Unchanged scarring and emphysematous changes at the lung apices. Other: None. IMPRESSION: 1. No acute intracranial abnormality. Small frontal scalp laceration and hematoma. 2. No acute cervical spine fracture or traumatic listhesis. Electronically Signed   By: Titus Dubin M.D.   On: 11/06/2021 12:21   CT Cervical Spine Wo Contrast  Result Date: 11/06/2021 CLINICAL DATA:  Fall. EXAM: CT HEAD WITHOUT CONTRAST CT CERVICAL SPINE WITHOUT CONTRAST TECHNIQUE: Multidetector CT imaging of the head and cervical spine was performed following the standard protocol without intravenous contrast. Multiplanar CT image reconstructions of the cervical spine were also generated. RADIATION DOSE REDUCTION: This exam was performed according to the departmental dose-optimization program which includes automated exposure control, adjustment of the mA and/or kV according to patient size and/or use of iterative reconstruction technique. COMPARISON:  CT head and cervical spine dated August 17, 2021. FINDINGS: CT  HEAD FINDINGS Brain: No evidence of acute infarction, hemorrhage, hydrocephalus, extra-axial collection or mass lesion/mass effect. Stable atrophy and chronic microvascular ischemic changes. Vascular: Atherosclerotic vascular calcification of the carotid siphons. No hyperdense vessel. Skull: Normal. Negative for fracture or focal lesion. Sinuses/Orbits: No acute finding. Other: Small frontal scalp laceration and hematoma. CT CERVICAL SPINE FINDINGS Alignment: No traumatic malalignment. Unchanged trace anterolisthesis at C5-C6. Skull base and vertebrae: No acute fracture. No primary bone lesion or focal pathologic process. Soft tissues and spinal canal: No prevertebral fluid or swelling. No visible canal hematoma. Disc levels: Similar mild-to-moderate multilevel degenerative changes. Upper chest: Unchanged scarring and emphysematous changes at the lung apices. Other: None. IMPRESSION: 1. No acute intracranial  abnormality. Small frontal scalp laceration and hematoma. 2. No acute cervical spine fracture or traumatic listhesis. Electronically Signed   By: Titus Dubin M.D.   On: 11/06/2021 12:21    Procedures .Marland KitchenLaceration Repair  Date/Time: 11/06/2021 6:22 PM  Performed by: Milton Ferguson, MD Authorized by: Milton Ferguson, MD   Comments:     Patient has a 3 cm laceration to the forehead.  Area was cleaned thoroughly with Betadine.  No anesthesia was used.  Laceration was closed with 3 staples.  The patient tolerated the procedure well     Medications Ordered in ED Medications - No data to display  ED Course/ Medical Decision Making/ A&P                           Medical Decision Making Amount and/or Complexity of Data Reviewed Radiology: ordered.    This patient presents to the ED for concern of fall, this involves an extensive number of treatment options, and is a complaint that carries with it a high risk of complications and morbidity.  The differential diagnosis includes head injury, cervical fracture   Co morbidities that complicate the patient evaluation  Diabetes COPD   Additional history obtained:  Additional history obtained from friend External records from outside source obtained and reviewed including hospital records   Lab Tests:  No labs done Imaging Studies ordered:  I ordered imaging studies including CT head cervical spine I independently visualized and interpreted imaging which showed unremarkable I agree with the radiologist interpretation   Cardiac Monitoring: / EKG:  The patient was maintained on a cardiac monitor.  I personally viewed and interpreted the cardiac monitored which showed an underlying rhythm of: Normal sinus rhythm   Consultations Obtained:  No consultant Problem List / ED Course / Critical interventions / Medication management  Fall and COPD and diabetes No medicines given Reevaluation of the patient after these medicines  showed that the patient improved I have reviewed the patients home medicines and have made adjustments as needed   Social Determinants of Health:  None   Test / Admission - Considered:  None     Patient with a head injury causing a 3 cm laceration to his forehead.  CT scan negative.  3 staples used to close laceration        Final Clinical Impression(s) / ED Diagnoses Final diagnoses:  Fall, initial encounter    Rx / DC Orders ED Discharge Orders     None         Milton Ferguson, MD 11/06/21 [phone]

## 2021-11-06 NOTE — ED Triage Notes (Addendum)
Pt to ED after a mechanical fall at home in driveway. Pt normally walks fine without assistive device. He tripped and fell today. No blood thinners. Hit front of head, small laceration to forehead and abrasions to forehead and nose. Bleeding controlled. Also, abrasions to left knee. A/Ox4.

## 2021-11-06 NOTE — Discharge Instructions (Signed)
Take Tylenol for pain.  Clean your abrasions and cuts twice a day with soap and water gently.  Get your staples taken out in 5 to 7 days.  You can go to your family doctor or an urgent care or can return here if necessary

## 2021-11-11 ENCOUNTER — Encounter (HOSPITAL_COMMUNITY)
Admission: RE | Admit: 2021-11-11 | Discharge: 2021-11-11 | Disposition: A | Discharge status: Home / Self Care | Payer: Medicare Other | Source: Ambulatory Visit | Attending: Nephrology | Admitting: Nephrology

## 2021-11-11 DIAGNOSIS — D631 Anemia in chronic kidney disease: Secondary | ICD-10-CM | POA: Diagnosis not present

## 2021-11-11 DIAGNOSIS — N184 Chronic kidney disease, stage 4 (severe): Secondary | ICD-10-CM | POA: Diagnosis not present

## 2021-11-11 DIAGNOSIS — N179 Acute kidney failure, unspecified: Secondary | ICD-10-CM | POA: Diagnosis not present

## 2021-11-11 LAB — POCT HEMOGLOBIN-HEMACUE: Hemoglobin: 9.7 g/dL — ABNORMAL LOW (ref 13.0–17.0)

## 2021-11-11 MED ORDER — EPOETIN ALFA-EPBX 3000 UNIT/ML IJ SOLN
3000.0000 [IU] | Freq: Once | INTRAMUSCULAR | Status: AC
  Administered 2021-11-11: 3000 [IU] via SUBCUTANEOUS

## 2021-11-11 MED ORDER — EPOETIN ALFA-EPBX 3000 UNIT/ML IJ SOLN
INTRAMUSCULAR | Status: AC
  Filled 2021-11-11: qty 1

## 2021-11-14 ENCOUNTER — Emergency Department (HOSPITAL_COMMUNITY)
Admission: EM | Admit: 2021-11-14 | Discharge: 2021-11-14 | Disposition: A | Discharge status: Home / Self Care | Payer: Medicare Other | Attending: Emergency Medicine | Admitting: Emergency Medicine

## 2021-11-14 ENCOUNTER — Encounter (HOSPITAL_COMMUNITY): Payer: Self-pay

## 2021-11-14 ENCOUNTER — Other Ambulatory Visit: Payer: Self-pay

## 2021-11-14 DIAGNOSIS — Z7982 Long term (current) use of aspirin: Secondary | ICD-10-CM | POA: Insufficient documentation

## 2021-11-14 DIAGNOSIS — Z4802 Encounter for removal of sutures: Secondary | ICD-10-CM | POA: Diagnosis not present

## 2021-11-14 DIAGNOSIS — S0181XD Laceration without foreign body of other part of head, subsequent encounter: Secondary | ICD-10-CM | POA: Diagnosis not present

## 2021-11-14 NOTE — ED Triage Notes (Signed)
Pt arrived from home to have staples removed. Pt recently fell on 8/25 and had laceration and abrasion to forehead.

## 2021-11-14 NOTE — Discharge Instructions (Addendum)
You were seen in the emergency department for suture/staple removal.  Your wound appears to be healing well. Please keep it clean and appropriately dressed.

## 2021-11-14 NOTE — ED Provider Notes (Signed)
[facility] EMERGENCY DEPARTMENT Provider Note   CSN: [id number] Arrival date & time: 11/14/21  1158     History  Chief Complaint  Patient presents with   Suture / Staple Removal    Nicholas Swanson is a 88 y.o. male who presents to the emergency department for staple removal. Had 3 staples placed in laceration on his forehead on 8/24. Reports normal wound healing, keeping it clean and applying neosporin. No concerns today.    Suture / Staple Removal       Home Medications Prior to Admission medications   Medication Sig Start Date End Date Taking? Authorizing Provider  albuterol (PROVENTIL) (2.5 MG/3ML) 0.083% nebulizer solution Take 3 mLs (2.5 mg total) by nebulization every 6 (six) hours as needed for wheezing or shortness of breath. 03/20/19   Elodia Florence., MD  albuterol (VENTOLIN HFA) 108 (90 Base) MCG/ACT inhaler Inhale 1-2 puffs into the lungs every 6 (six) hours as needed for wheezing or shortness of breath.  02/20/19   [provider]  amLODipine (NORVASC) 10 MG tablet Take 10 mg by mouth daily. 02/06/19   [provider]  aspirin EC 81 MG tablet Take 81 mg by mouth daily.    [provider]  atorvastatin (LIPITOR) 40 MG tablet Take 1 tablet (40 mg total) by mouth daily. 03/20/19 04/19/19  Elodia Florence., MD  glipiZIDE (GLUCOTROL XL) 2.5 MG 24 hr tablet Take 2.5 mg by mouth daily.    [provider]  metFORMIN (GLUCOPHAGE) 1000 MG tablet Take 1,000 mg by mouth 2 (two) times daily with a meal.    [provider]  metoprolol succinate (TOPROL-XL) 50 MG 24 hr tablet Take 1 tablet (50 mg total) by mouth daily. 03/21/19 04/20/19  Elodia Florence., MD  sodium polystyrene (KAYEXALATE) powder Take 15 grams daily for 7 days.  Please follow up with your PCP for repeat labs once this is complete. 03/20/19   Elodia Florence., MD  terazosin (HYTRIN) 5 MG capsule Take 5 mg by mouth at bedtime.    [provider]       Allergies    Ace inhibitors and Simvastatin    Review of Systems   Review of Systems  Skin:  Positive for wound.  All other systems reviewed and are negative.   Physical Exam Updated Vital Signs BP (!) 166/78 (BP Location: Right Arm)   Pulse 74   Temp 97.8 F (36.6 C) (Oral)   Resp 17   Ht '5\' 8"'$  (1.727 m)   Wt 84.8 kg   SpO2 100%   BMI 28.43 kg/m  Physical Exam Vitals and nursing note reviewed.  Constitutional:      Appearance: Normal appearance.  HENT:     Head: Normocephalic.     Comments: Partially healed laceration to right forehead with 3 staples in place, normal wound healing Eyes:     Conjunctiva/sclera: Conjunctivae normal.  Pulmonary:     Effort: Pulmonary effort is normal. No respiratory distress.  Skin:    General: Skin is warm and dry.  Neurological:     Mental Status: He is alert.  Psychiatric:        Mood and Affect: Mood normal.        Behavior: Behavior normal.     ED Results / Procedures / Treatments   Labs (all labs ordered are listed, but only abnormal results are displayed) Labs Reviewed - No data to display  EKG None  Radiology No results found.  Procedures .Suture Removal  Date/Time: 11/14/2021 2:35 PM  Performed by: Kateri Plummer, PA-C Authorized by: Kateri Plummer, PA-C   Consent:    Consent obtained:  Verbal   Consent given by:  Patient   Risks, benefits, and alternatives were discussed: yes     Risks discussed:  Bleeding, pain and wound separation Universal protocol:    Patient identity confirmed:  Provided demographic data Location:    Location:  Head/neck   Head/neck location:  Forehead Procedure details:    Wound appearance:  No signs of infection, good wound healing and clean   Number of staples removed:  3 Post-procedure details:    Post-removal:  No dressing applied   Procedure completion:  Tolerated well, no immediate complications     Medications Ordered in ED Medications - No data to  display  ED Course/ Medical Decision Making/ A&P                           Medical Decision Making  Patient is an 88 year old male who presents to the emergency department for suture removal.  On exam, patient has a partially healed laceration to the forehead with 3 staples in place.   Staples removed and patient tolerated procedure well. Discharged in stable condition. Given instructions for wound care.          Final Clinical Impression(s) / ED Diagnoses Final diagnoses:  Encounter for staple removal    Rx / DC Orders ED Discharge Orders     None      Portions of this report may have been transcribed using voice recognition software. Every effort was made to ensure accuracy; however, inadvertent computerized transcription errors may be present.    Estill Cotta 11/14/21 1435    Wyvonnia Dusky, MD 11/14/21 1712

## 2021-11-24 DIAGNOSIS — S20213D Contusion of bilateral front wall of thorax, subsequent encounter: Secondary | ICD-10-CM | POA: Diagnosis not present

## 2021-11-24 DIAGNOSIS — R079 Chest pain, unspecified: Secondary | ICD-10-CM | POA: Diagnosis not present

## 2021-11-25 ENCOUNTER — Encounter (HOSPITAL_COMMUNITY)
Admission: RE | Admit: 2021-11-25 | Discharge: 2021-11-25 | Disposition: A | Discharge status: Home / Self Care | Payer: Medicare Other | Source: Ambulatory Visit | Attending: Nephrology | Admitting: Nephrology

## 2021-11-25 ENCOUNTER — Encounter (HOSPITAL_COMMUNITY): Payer: Self-pay

## 2021-11-25 VITALS — BP 136/54 | HR 68 | Temp 97.8°F | Resp 18

## 2021-11-25 DIAGNOSIS — N184 Chronic kidney disease, stage 4 (severe): Secondary | ICD-10-CM | POA: Diagnosis not present

## 2021-11-25 DIAGNOSIS — D631 Anemia in chronic kidney disease: Secondary | ICD-10-CM | POA: Diagnosis not present

## 2021-11-25 DIAGNOSIS — N179 Acute kidney failure, unspecified: Secondary | ICD-10-CM | POA: Insufficient documentation

## 2021-11-25 LAB — POCT HEMOGLOBIN-HEMACUE: Hemoglobin: 9 g/dL — ABNORMAL LOW (ref 13.0–17.0)

## 2021-11-25 MED ORDER — EPOETIN ALFA-EPBX 3000 UNIT/ML IJ SOLN
INTRAMUSCULAR | Status: AC
  Filled 2021-11-25: qty 1

## 2021-11-25 MED ORDER — EPOETIN ALFA-EPBX 3000 UNIT/ML IJ SOLN
3000.0000 [IU] | Freq: Once | INTRAMUSCULAR | Status: AC
  Administered 2021-11-25: 3000 [IU] via SUBCUTANEOUS

## 2021-12-04 MED ORDER — EPOETIN ALFA-EPBX 3000 UNIT/ML IJ SOLN
3000.0000 [IU] | Freq: Once | INTRAMUSCULAR | Status: AC
  Administered 2021-12-09: 3000 [IU] via SUBCUTANEOUS
  Filled 2021-12-04: qty 1

## 2021-12-09 ENCOUNTER — Encounter (HOSPITAL_COMMUNITY)
Admission: RE | Admit: 2021-12-09 | Discharge: 2021-12-09 | Disposition: A | Discharge status: Home / Self Care | Payer: Medicare Other | Source: Ambulatory Visit | Attending: Nephrology | Admitting: Nephrology

## 2021-12-09 VITALS — BP 156/48 | HR 67 | Temp 97.6°F | Resp 16 | Ht 73.0 in | Wt 180.0 lb

## 2021-12-09 DIAGNOSIS — N179 Acute kidney failure, unspecified: Secondary | ICD-10-CM

## 2021-12-09 DIAGNOSIS — D631 Anemia in chronic kidney disease: Secondary | ICD-10-CM | POA: Diagnosis not present

## 2021-12-09 DIAGNOSIS — N184 Chronic kidney disease, stage 4 (severe): Secondary | ICD-10-CM | POA: Diagnosis not present

## 2021-12-09 LAB — POCT HEMOGLOBIN-HEMACUE: Hemoglobin: 9.5 g/dL — ABNORMAL LOW (ref 13.0–17.0)

## 2021-12-23 ENCOUNTER — Encounter (HOSPITAL_COMMUNITY): Payer: Medicare Other

## 2021-12-23 ENCOUNTER — Encounter (HOSPITAL_COMMUNITY)
Admission: RE | Admit: 2021-12-23 | Discharge: 2021-12-23 | Disposition: A | Discharge status: Home / Self Care | Payer: Medicare Other | Source: Ambulatory Visit | Attending: Nephrology | Admitting: Nephrology

## 2021-12-23 VITALS — HR 66 | Temp 98.4°F | Resp 16 | Ht 73.0 in | Wt 180.0 lb

## 2021-12-23 DIAGNOSIS — N184 Chronic kidney disease, stage 4 (severe): Secondary | ICD-10-CM | POA: Diagnosis not present

## 2021-12-23 DIAGNOSIS — N179 Acute kidney failure, unspecified: Secondary | ICD-10-CM

## 2021-12-23 DIAGNOSIS — D631 Anemia in chronic kidney disease: Secondary | ICD-10-CM | POA: Diagnosis not present

## 2021-12-23 LAB — POCT HEMOGLOBIN-HEMACUE: Hemoglobin: 9.2 g/dL — ABNORMAL LOW (ref 13.0–17.0)

## 2021-12-23 MED ORDER — EPOETIN ALFA-EPBX 3000 UNIT/ML IJ SOLN
INTRAMUSCULAR | Status: AC
  Filled 2021-12-23: qty 1

## 2021-12-23 MED ORDER — EPOETIN ALFA-EPBX 3000 UNIT/ML IJ SOLN
3000.0000 [IU] | Freq: Once | INTRAMUSCULAR | Status: AC
  Administered 2021-12-23: 3000 [IU] via SUBCUTANEOUS

## 2022-01-06 ENCOUNTER — Encounter (HOSPITAL_COMMUNITY)
Admission: RE | Admit: 2022-01-06 | Discharge: 2022-01-06 | Disposition: A | Discharge status: Home / Self Care | Payer: Medicare Other | Source: Ambulatory Visit | Attending: Nephrology | Admitting: Nephrology

## 2022-01-06 VITALS — BP 134/60 | HR 60 | Temp 98.5°F | Resp 18

## 2022-01-06 DIAGNOSIS — N184 Chronic kidney disease, stage 4 (severe): Secondary | ICD-10-CM | POA: Diagnosis not present

## 2022-01-06 DIAGNOSIS — D631 Anemia in chronic kidney disease: Secondary | ICD-10-CM | POA: Diagnosis not present

## 2022-01-06 DIAGNOSIS — N179 Acute kidney failure, unspecified: Secondary | ICD-10-CM

## 2022-01-06 LAB — CBC
HCT: 30.2 % — ABNORMAL LOW (ref 39.0–52.0)
Hemoglobin: 9.7 g/dL — ABNORMAL LOW (ref 13.0–17.0)
MCH: 30.6 pg (ref 26.0–34.0)
MCHC: 32.1 g/dL (ref 30.0–36.0)
MCV: 95.3 fL (ref 80.0–100.0)
Platelets: 312 10*3/uL (ref 150–400)
RBC: 3.17 MIL/uL — ABNORMAL LOW (ref 4.22–5.81)
RDW: 13.2 % (ref 11.5–15.5)
WBC: 7.1 10*3/uL (ref 4.0–10.5)
nRBC: 0 % (ref 0.0–0.2)

## 2022-01-06 LAB — RENAL FUNCTION PANEL
Albumin: 3.9 g/dL (ref 3.5–5.0)
Anion gap: 8 (ref 5–15)
BUN: 61 mg/dL — ABNORMAL HIGH (ref 8–23)
CO2: 21 mmol/L — ABNORMAL LOW (ref 22–32)
Calcium: 8.7 mg/dL — ABNORMAL LOW (ref 8.9–10.3)
Chloride: 106 mmol/L (ref 98–111)
Creatinine, Ser: 3.56 mg/dL — ABNORMAL HIGH (ref 0.61–1.24)
GFR, Estimated: 16 mL/min — ABNORMAL LOW (ref >60)
Glucose, Bld: 138 mg/dL — ABNORMAL HIGH (ref 70–99)
Phosphorus: 3.9 mg/dL (ref 2.5–4.6)
Potassium: 5.2 mmol/L — ABNORMAL HIGH (ref 3.5–5.1)
Sodium: 135 mmol/L (ref 135–145)

## 2022-01-06 MED ORDER — EPOETIN ALFA-EPBX 3000 UNIT/ML IJ SOLN
3000.0000 [IU] | Freq: Once | INTRAMUSCULAR | Status: AC
  Administered 2022-01-06: 3000 [IU] via SUBCUTANEOUS

## 2022-01-06 MED ORDER — EPOETIN ALFA-EPBX 3000 UNIT/ML IJ SOLN
INTRAMUSCULAR | Status: AC
  Filled 2022-01-06: qty 1

## 2022-01-06 NOTE — Progress Notes (Signed)
Unable to void. No urine collected for protein/CREAT results.

## 2022-01-07 DIAGNOSIS — N189 Chronic kidney disease, unspecified: Secondary | ICD-10-CM | POA: Diagnosis not present

## 2022-01-07 DIAGNOSIS — D638 Anemia in other chronic diseases classified elsewhere: Secondary | ICD-10-CM | POA: Diagnosis not present

## 2022-01-07 DIAGNOSIS — E1129 Type 2 diabetes mellitus with other diabetic kidney complication: Secondary | ICD-10-CM | POA: Diagnosis not present

## 2022-01-07 DIAGNOSIS — E1122 Type 2 diabetes mellitus with diabetic chronic kidney disease: Secondary | ICD-10-CM | POA: Diagnosis not present

## 2022-01-07 DIAGNOSIS — D631 Anemia in chronic kidney disease: Secondary | ICD-10-CM | POA: Diagnosis not present

## 2022-01-07 DIAGNOSIS — E875 Hyperkalemia: Secondary | ICD-10-CM | POA: Diagnosis not present

## 2022-01-07 DIAGNOSIS — I129 Hypertensive chronic kidney disease with stage 1 through stage 4 chronic kidney disease, or unspecified chronic kidney disease: Secondary | ICD-10-CM | POA: Diagnosis not present

## 2022-01-07 DIAGNOSIS — R809 Proteinuria, unspecified: Secondary | ICD-10-CM | POA: Diagnosis not present

## 2022-01-07 LAB — POCT HEMOGLOBIN-HEMACUE: Hemoglobin: 9.6 g/dL — ABNORMAL LOW (ref 13.0–17.0)

## 2022-01-20 ENCOUNTER — Encounter (HOSPITAL_COMMUNITY)
Admission: RE | Admit: 2022-01-20 | Discharge: 2022-01-20 | Disposition: A | Discharge status: Home / Self Care | Payer: Medicare Other | Source: Ambulatory Visit | Attending: Nephrology | Admitting: Nephrology

## 2022-01-20 VITALS — BP 122/43 | HR 64 | Temp 98.3°F | Resp 18

## 2022-01-20 DIAGNOSIS — N179 Acute kidney failure, unspecified: Secondary | ICD-10-CM | POA: Diagnosis not present

## 2022-01-20 LAB — POCT HEMOGLOBIN-HEMACUE: Hemoglobin: 9.7 g/dL — ABNORMAL LOW (ref 13.0–17.0)

## 2022-01-20 MED ORDER — EPOETIN ALFA-EPBX 3000 UNIT/ML IJ SOLN
3000.0000 [IU] | Freq: Once | INTRAMUSCULAR | Status: AC
  Administered 2022-01-20: 3000 [IU] via SUBCUTANEOUS

## 2022-01-20 MED ORDER — EPOETIN ALFA-EPBX 3000 UNIT/ML IJ SOLN
INTRAMUSCULAR | Status: AC
  Filled 2022-01-20: qty 1

## 2022-02-03 ENCOUNTER — Encounter (HOSPITAL_COMMUNITY)
Admission: RE | Admit: 2022-02-03 | Discharge: 2022-02-03 | Disposition: A | Discharge status: Home / Self Care | Payer: Medicare Other | Source: Ambulatory Visit | Attending: Nephrology | Admitting: Nephrology

## 2022-02-03 VITALS — BP 136/70 | HR 64 | Temp 98.0°F | Resp 16

## 2022-02-03 DIAGNOSIS — N179 Acute kidney failure, unspecified: Secondary | ICD-10-CM

## 2022-02-03 LAB — POCT HEMOGLOBIN-HEMACUE: Hemoglobin: 10.2 g/dL — ABNORMAL LOW (ref 13.0–17.0)

## 2022-02-03 MED ORDER — EPOETIN ALFA-EPBX 3000 UNIT/ML IJ SOLN
3000.0000 [IU] | Freq: Once | INTRAMUSCULAR | Status: DC
  Filled 2022-02-03: qty 1

## 2022-02-12 DIAGNOSIS — D631 Anemia in chronic kidney disease: Secondary | ICD-10-CM | POA: Insufficient documentation

## 2022-02-17 ENCOUNTER — Encounter (HOSPITAL_COMMUNITY)
Admission: RE | Admit: 2022-02-17 | Discharge: 2022-02-17 | Disposition: A | Discharge status: Home / Self Care | Payer: Medicare Other | Source: Ambulatory Visit | Attending: Nephrology | Admitting: Nephrology

## 2022-02-17 VITALS — BP 114/59 | HR 61 | Temp 98.3°F | Resp 16

## 2022-02-17 DIAGNOSIS — N184 Chronic kidney disease, stage 4 (severe): Secondary | ICD-10-CM | POA: Diagnosis not present

## 2022-02-17 DIAGNOSIS — D631 Anemia in chronic kidney disease: Secondary | ICD-10-CM | POA: Insufficient documentation

## 2022-02-17 LAB — POCT HEMOGLOBIN-HEMACUE: Hemoglobin: 9.5 g/dL — ABNORMAL LOW (ref 13.0–17.0)

## 2022-02-17 MED ORDER — EPOETIN ALFA-EPBX 3000 UNIT/ML IJ SOLN
3000.0000 [IU] | Freq: Once | INTRAMUSCULAR | Status: AC
  Administered 2022-02-17: 3000 [IU] via SUBCUTANEOUS
  Filled 2022-02-17: qty 1

## 2022-02-17 NOTE — Progress Notes (Signed)
Diagnosis: Anemia in Chronic Kidney Disease  Provider:  Manpreet Bhutani MD  Procedure: Injection  Procrit (epoetin alfa), Dose: 3000 Units, Site: subcutaneous, Number of injections: 1  HGB 9.5  Post Care: Observation period completed  Discharge: Condition: Good, Destination: Home . AVS provided to patient.   Performed by:  Hughie Closs, RN

## 2022-03-03 ENCOUNTER — Encounter (HOSPITAL_COMMUNITY)
Admission: RE | Admit: 2022-03-03 | Discharge: 2022-03-03 | Disposition: A | Discharge status: Home / Self Care | Payer: Medicare Other | Source: Ambulatory Visit | Attending: Nephrology | Admitting: Nephrology

## 2022-03-03 VITALS — BP 158/51 | HR 68 | Temp 98.5°F | Resp 18

## 2022-03-03 DIAGNOSIS — N184 Chronic kidney disease, stage 4 (severe): Secondary | ICD-10-CM

## 2022-03-03 DIAGNOSIS — D631 Anemia in chronic kidney disease: Secondary | ICD-10-CM | POA: Diagnosis not present

## 2022-03-03 LAB — POCT HEMOGLOBIN-HEMACUE: Hemoglobin: 10.6 g/dL — ABNORMAL LOW (ref 13.0–17.0)

## 2022-03-03 MED ORDER — EPOETIN ALFA-EPBX 3000 UNIT/ML IJ SOLN
3000.0000 [IU] | Freq: Once | INTRAMUSCULAR | Status: DC
  Filled 2022-03-03: qty 1

## 2022-03-03 NOTE — Progress Notes (Signed)
Diagnosis: Anemia in Chronic Kidney Disease  Provider:  Manpreet Bhutani MD  Procedure: Injection  Retacrit (epoetin alfa-epbx), Dose: 3000 Units, Site: subcutaneous, Number of injections: 0  Retacrit was not administered. Hgb 10.6  Post Care: Patient declined observation  Discharge: Condition: Good, Destination: Home . AVS provided to patient.   Performed by:  Binnie Kand, RN

## 2022-03-03 NOTE — Addendum Note (Signed)
Encounter addended by: Baxter Hire, RN on: 03/03/2022 1:50 PM  Actions taken: Therapy plan modified

## 2022-03-17 ENCOUNTER — Encounter (HOSPITAL_COMMUNITY)
Admission: RE | Admit: 2022-03-17 | Discharge: 2022-03-17 | Disposition: A | Discharge status: Home / Self Care | Payer: Medicare Other | Source: Ambulatory Visit | Attending: Nephrology | Admitting: Nephrology

## 2022-03-17 VITALS — BP 145/60 | HR 66 | Temp 98.5°F | Resp 16

## 2022-03-17 DIAGNOSIS — D631 Anemia in chronic kidney disease: Secondary | ICD-10-CM

## 2022-03-17 DIAGNOSIS — N184 Chronic kidney disease, stage 4 (severe): Secondary | ICD-10-CM | POA: Insufficient documentation

## 2022-03-17 LAB — POCT HEMOGLOBIN-HEMACUE: Hemoglobin: 9.4 g/dL — ABNORMAL LOW (ref 13.0–17.0)

## 2022-03-17 MED ORDER — EPOETIN ALFA-EPBX 3000 UNIT/ML IJ SOLN
3000.0000 [IU] | Freq: Once | INTRAMUSCULAR | Status: AC
  Administered 2022-03-17: 3000 [IU] via SUBCUTANEOUS

## 2022-03-17 NOTE — Progress Notes (Signed)
Diagnosis: Anemia in Chronic Kidney Disease  Provider:  Manpreet Bhutani MD  Procedure: Injection  Retacrit (epoetin alfa-epbx), Dose: 3000 Units, Site: subcutaneous, Number of injections: 1  HGB 9.4   Post Care: Patient declined observation  Discharge: Condition: Good, Destination: Home . AVS provided to patient.   Performed by:  Baxter Hire, RN

## 2022-03-31 ENCOUNTER — Encounter (HOSPITAL_COMMUNITY)
Admission: RE | Admit: 2022-03-31 | Discharge: 2022-03-31 | Disposition: A | Discharge status: Home / Self Care | Payer: Medicare Other | Source: Ambulatory Visit | Attending: Nephrology | Admitting: Nephrology

## 2022-03-31 VITALS — BP 139/59 | HR 63 | Temp 98.3°F | Resp 18

## 2022-03-31 DIAGNOSIS — D631 Anemia in chronic kidney disease: Secondary | ICD-10-CM | POA: Diagnosis not present

## 2022-03-31 DIAGNOSIS — N184 Chronic kidney disease, stage 4 (severe): Secondary | ICD-10-CM | POA: Diagnosis not present

## 2022-03-31 LAB — POCT HEMOGLOBIN-HEMACUE: Hemoglobin: 8.7 g/dL — ABNORMAL LOW (ref 13.0–17.0)

## 2022-03-31 MED ORDER — EPOETIN ALFA-EPBX 3000 UNIT/ML IJ SOLN
3000.0000 [IU] | Freq: Once | INTRAMUSCULAR | Status: AC
  Administered 2022-03-31: 3000 [IU] via SUBCUTANEOUS
  Filled 2022-03-31: qty 1

## 2022-03-31 NOTE — Progress Notes (Signed)
Diagnosis: Anemia in Chronic Kidney Disease  Provider:  Manpreet Bhutani MD  Procedure: Injection  Retacrit (epoetin alfa-epbx), Dose: 300 mg, Site: subcutaneous, Number of injections: 1  HGB 8.6   Post Care: Observation period completed  Discharge: Condition: Good, Destination: Home . AVS provided to patient.   Performed by:  Hughie Closs, RN

## 2022-03-31 NOTE — Addendum Note (Signed)
Encounter addended by: Baxter Hire, RN on: 03/31/2022 2:08 PM  Actions taken: Therapy plan modified

## 2022-04-10 ENCOUNTER — Other Ambulatory Visit (HOSPITAL_COMMUNITY)
Admission: RE | Admit: 2022-04-10 | Discharge: 2022-04-10 | Disposition: A | Discharge status: Home / Self Care | Payer: Medicare Other | Source: Ambulatory Visit | Attending: Nephrology | Admitting: Nephrology

## 2022-04-10 DIAGNOSIS — N189 Chronic kidney disease, unspecified: Secondary | ICD-10-CM | POA: Insufficient documentation

## 2022-04-10 LAB — CBC
HCT: 28.6 % — ABNORMAL LOW (ref 39.0–52.0)
Hemoglobin: 9.2 g/dL — ABNORMAL LOW (ref 13.0–17.0)
MCH: 30.5 pg (ref 26.0–34.0)
MCHC: 32.2 g/dL (ref 30.0–36.0)
MCV: 94.7 fL (ref 80.0–100.0)
Platelets: 234 10*3/uL (ref 150–400)
RBC: 3.02 MIL/uL — ABNORMAL LOW (ref 4.22–5.81)
RDW: 14 % (ref 11.5–15.5)
WBC: 6.6 10*3/uL (ref 4.0–10.5)
nRBC: 0 % (ref 0.0–0.2)

## 2022-04-10 LAB — PROTEIN / CREATININE RATIO, URINE
Creatinine, Urine: 102.88 mg/dL
Protein Creatinine Ratio: 0.28 mg/mg{Cre} — ABNORMAL HIGH (ref 0.00–0.15)
Total Protein, Urine: 29 mg/dL

## 2022-04-10 LAB — RENAL FUNCTION PANEL
Albumin: 3.7 g/dL (ref 3.5–5.0)
Anion gap: 9 (ref 5–15)
BUN: 59 mg/dL — ABNORMAL HIGH (ref 8–23)
CO2: 21 mmol/L — ABNORMAL LOW (ref 22–32)
Calcium: 8.4 mg/dL — ABNORMAL LOW (ref 8.9–10.3)
Chloride: 106 mmol/L (ref 98–111)
Creatinine, Ser: 3.93 mg/dL — ABNORMAL HIGH (ref 0.61–1.24)
GFR, Estimated: 14 mL/min — ABNORMAL LOW (ref >60)
Glucose, Bld: 171 mg/dL — ABNORMAL HIGH (ref 70–99)
Phosphorus: 3.3 mg/dL (ref 2.5–4.6)
Potassium: 4.7 mmol/L (ref 3.5–5.1)
Sodium: 136 mmol/L (ref 135–145)

## 2022-04-14 ENCOUNTER — Encounter (HOSPITAL_COMMUNITY)
Admission: RE | Admit: 2022-04-14 | Discharge: 2022-04-14 | Disposition: A | Discharge status: Home / Self Care | Payer: Medicare Other | Source: Ambulatory Visit | Attending: Nephrology | Admitting: Nephrology

## 2022-04-14 VITALS — BP 137/60 | HR 70 | Temp 98.3°F | Resp 20

## 2022-04-14 DIAGNOSIS — D631 Anemia in chronic kidney disease: Secondary | ICD-10-CM | POA: Diagnosis not present

## 2022-04-14 DIAGNOSIS — N184 Chronic kidney disease, stage 4 (severe): Secondary | ICD-10-CM

## 2022-04-14 LAB — POCT HEMOGLOBIN-HEMACUE: Hemoglobin: 10.4 g/dL — ABNORMAL LOW (ref 13.0–17.0)

## 2022-04-14 MED ORDER — EPOETIN ALFA-EPBX 3000 UNIT/ML IJ SOLN: 3000.0000 [IU] | Freq: Once | INTRAMUSCULAR | Status: DC

## 2022-04-14 NOTE — Progress Notes (Signed)
Diagnosis: Anemia in Chronic Kidney Disease  Provider:  Manpreet Bhutani MD  Procedure: Injection  Retacrit (epoetin alfa-epbx), Dose: 3000 Units, Site: subcutaneous, Number of injections: Not Given  Hgb 10.4  Post Care: Patient declined observation  Discharge: Condition: Good, Destination: Home . AVS provided to patient.   Performed by:  Baxter Hire, RN

## 2022-04-23 ENCOUNTER — Encounter (HOSPITAL_COMMUNITY): Payer: Self-pay | Admitting: Nephrology

## 2022-04-28 ENCOUNTER — Encounter (HOSPITAL_COMMUNITY)
Admission: RE | Admit: 2022-04-28 | Discharge: 2022-04-28 | Disposition: A | Discharge status: Home / Self Care | Payer: Medicare Other | Source: Ambulatory Visit | Attending: Nephrology | Admitting: Nephrology

## 2022-04-28 VITALS — BP 159/62 | HR 64 | Temp 97.5°F | Resp 16

## 2022-04-28 DIAGNOSIS — D631 Anemia in chronic kidney disease: Secondary | ICD-10-CM | POA: Insufficient documentation

## 2022-04-28 DIAGNOSIS — N184 Chronic kidney disease, stage 4 (severe): Secondary | ICD-10-CM | POA: Insufficient documentation

## 2022-04-28 LAB — POCT HEMOGLOBIN-HEMACUE: Hemoglobin: 10.4 g/dL — ABNORMAL LOW (ref 13.0–17.0)

## 2022-04-28 MED ORDER — EPOETIN ALFA-EPBX 3000 UNIT/ML IJ SOLN: 3000.0000 [IU] | Freq: Once | INTRAMUSCULAR | Status: DC

## 2022-04-28 NOTE — Progress Notes (Signed)
Diagnosis: Anemia in Chronic Kidney Disease  Provider:  Manpreet Bhutani MD  Procedure: Injection  Retacrit (epoetin alfa-epbx), Dose: 3000 Units, Site: subcutaneous, Number of injections: NOT GIVEN  Hgb 10.4  Post Care: Patient declined observation  Discharge: Condition: Good, Destination: Home . AVS Provided  Performed by:  Baxter Hire, RN

## 2022-05-12 ENCOUNTER — Ambulatory Visit (HOSPITAL_COMMUNITY): Payer: Medicare Other

## 2022-05-12 ENCOUNTER — Encounter (HOSPITAL_COMMUNITY)
Admission: RE | Admit: 2022-05-12 | Discharge: 2022-05-12 | Disposition: A | Discharge status: Home / Self Care | Payer: Medicare Other | Source: Ambulatory Visit | Attending: Nephrology | Admitting: Nephrology

## 2022-05-12 VITALS — BP 136/44 | HR 67 | Temp 98.6°F | Resp 18

## 2022-05-12 DIAGNOSIS — D631 Anemia in chronic kidney disease: Secondary | ICD-10-CM | POA: Diagnosis not present

## 2022-05-12 DIAGNOSIS — N184 Chronic kidney disease, stage 4 (severe): Secondary | ICD-10-CM | POA: Diagnosis not present

## 2022-05-12 LAB — POCT HEMOGLOBIN-HEMACUE: Hemoglobin: 9.8 g/dL — ABNORMAL LOW (ref 13.0–17.0)

## 2022-05-12 MED ORDER — EPOETIN ALFA-EPBX 3000 UNIT/ML IJ SOLN
3000.0000 [IU] | Freq: Once | INTRAMUSCULAR | Status: AC
  Administered 2022-05-12: 3000 [IU] via SUBCUTANEOUS

## 2022-05-12 NOTE — Progress Notes (Signed)
Diagnosis: Anemia in Chronic Kidney Disease  Provider:  Manpreet Bhutani MD  Procedure: Injection  Retacrit (epoetin alfa-epbx), Dose: 3000 Units, Site: subcutaneous, Number of injections: 1  HGB 9.8  Post Care: Patient declined observation  Discharge: Condition: Good, Destination: Home . AVS Provided  Performed by:  Grayland Ormond, RN

## 2022-05-14 DIAGNOSIS — E1122 Type 2 diabetes mellitus with diabetic chronic kidney disease: Secondary | ICD-10-CM | POA: Diagnosis not present

## 2022-05-14 DIAGNOSIS — I129 Hypertensive chronic kidney disease with stage 1 through stage 4 chronic kidney disease, or unspecified chronic kidney disease: Secondary | ICD-10-CM | POA: Diagnosis not present

## 2022-05-14 DIAGNOSIS — E1129 Type 2 diabetes mellitus with other diabetic kidney complication: Secondary | ICD-10-CM | POA: Diagnosis not present

## 2022-05-14 DIAGNOSIS — D638 Anemia in other chronic diseases classified elsewhere: Secondary | ICD-10-CM | POA: Diagnosis not present

## 2022-05-14 DIAGNOSIS — E8722 Chronic metabolic acidosis: Secondary | ICD-10-CM | POA: Diagnosis not present

## 2022-05-14 DIAGNOSIS — E875 Hyperkalemia: Secondary | ICD-10-CM | POA: Diagnosis not present

## 2022-05-14 DIAGNOSIS — R809 Proteinuria, unspecified: Secondary | ICD-10-CM | POA: Diagnosis not present

## 2022-05-14 DIAGNOSIS — N185 Chronic kidney disease, stage 5: Secondary | ICD-10-CM | POA: Diagnosis not present

## 2022-05-14 DIAGNOSIS — E211 Secondary hyperparathyroidism, not elsewhere classified: Secondary | ICD-10-CM | POA: Diagnosis not present

## 2022-05-26 ENCOUNTER — Encounter (HOSPITAL_COMMUNITY)
Admission: RE | Admit: 2022-05-26 | Discharge: 2022-05-26 | Disposition: A | Discharge status: Home / Self Care | Payer: Medicare Other | Source: Ambulatory Visit | Attending: Nephrology | Admitting: Nephrology

## 2022-05-26 VITALS — BP 158/50 | HR 67 | Temp 99.0°F | Resp 24

## 2022-05-26 DIAGNOSIS — D631 Anemia in chronic kidney disease: Secondary | ICD-10-CM | POA: Diagnosis not present

## 2022-05-26 DIAGNOSIS — N184 Chronic kidney disease, stage 4 (severe): Secondary | ICD-10-CM | POA: Diagnosis not present

## 2022-05-26 LAB — POCT HEMOGLOBIN-HEMACUE: Hemoglobin: 8.7 g/dL — ABNORMAL LOW (ref 13.0–17.0)

## 2022-05-26 MED ORDER — EPOETIN ALFA-EPBX 3000 UNIT/ML IJ SOLN
3000.0000 [IU] | Freq: Once | INTRAMUSCULAR | Status: AC
  Administered 2022-05-26: 3000 [IU] via SUBCUTANEOUS

## 2022-05-26 NOTE — Progress Notes (Signed)
Diagnosis: Anemia in Chronic Kidney Disease  Provider:  Manpreet Bhutani MD  Procedure: Injection  Retacrit (epoetin alfa-epbx), Dose: 3000 Units, Site: subcutaneous, Number of injections: 1  Hgb 8.7  Post Care: Patient declined observation  Discharge: Condition: Good, Destination: Home . AVS Provided  Performed by:  Binnie Kand, RN

## 2022-06-09 ENCOUNTER — Encounter (HOSPITAL_COMMUNITY)
Admission: RE | Admit: 2022-06-09 | Discharge: 2022-06-09 | Disposition: A | Discharge status: Home / Self Care | Payer: Medicare Other | Source: Ambulatory Visit | Attending: Nephrology | Admitting: Nephrology

## 2022-06-09 VITALS — BP 158/50 | HR 66 | Temp 97.8°F | Resp 16

## 2022-06-09 DIAGNOSIS — N184 Chronic kidney disease, stage 4 (severe): Secondary | ICD-10-CM

## 2022-06-09 DIAGNOSIS — D631 Anemia in chronic kidney disease: Secondary | ICD-10-CM | POA: Diagnosis not present

## 2022-06-09 LAB — POCT HEMOGLOBIN-HEMACUE: Hemoglobin: 9.7 g/dL — ABNORMAL LOW (ref 13.0–17.0)

## 2022-06-09 MED ORDER — EPOETIN ALFA-EPBX 3000 UNIT/ML IJ SOLN
3000.0000 [IU] | Freq: Once | INTRAMUSCULAR | Status: AC
  Administered 2022-06-09: 3000 [IU] via SUBCUTANEOUS
  Filled 2022-06-09: qty 1

## 2022-06-09 NOTE — Progress Notes (Signed)
Diagnosis: Anemia in Chronic Kidney Disease  Provider:  Manpreet Bhutani MD  Procedure: Injection  Retacrit (epoetin alfa-epbx), Dose: 3000 Units, Site: subcutaneous, Number of injections: 1  Hgb 9.7  Post Care: Patient declined observation  Discharge: Condition: Good, Destination: Home . AVS Provided  Performed by:  Binnie Kand, RN

## 2022-06-09 NOTE — Addendum Note (Signed)
Encounter addended by: Baxter Hire, RN on: 06/09/2022 1:27 PM  Actions taken: Therapy plan modified

## 2022-06-09 NOTE — Progress Notes (Signed)
Diagnosis: Anemia in Chronic Kidney Disease  Provider:  Manpreet Bhutani MD  Procedure: Injection  Retacrit (epoetin alfa-epbx), Dose: 3000 Units, Site: subcutaneous, Number of injections: 1  Post Care: Patient declined observation  Discharge: Condition: Good, Destination: Home . AVS Provided  Performed by:  Ailanie Ruttan E, RN        

## 2022-06-19 ENCOUNTER — Other Ambulatory Visit (HOSPITAL_COMMUNITY)
Admission: RE | Admit: 2022-06-19 | Discharge: 2022-06-19 | Disposition: A | Discharge status: Home / Self Care | Payer: Medicare Other | Source: Ambulatory Visit | Attending: Nephrology | Admitting: Nephrology

## 2022-06-19 DIAGNOSIS — E875 Hyperkalemia: Secondary | ICD-10-CM | POA: Diagnosis not present

## 2022-06-19 DIAGNOSIS — R809 Proteinuria, unspecified: Secondary | ICD-10-CM | POA: Insufficient documentation

## 2022-06-19 DIAGNOSIS — N185 Chronic kidney disease, stage 5: Secondary | ICD-10-CM | POA: Diagnosis not present

## 2022-06-19 DIAGNOSIS — D631 Anemia in chronic kidney disease: Secondary | ICD-10-CM | POA: Insufficient documentation

## 2022-06-19 DIAGNOSIS — E211 Secondary hyperparathyroidism, not elsewhere classified: Secondary | ICD-10-CM | POA: Diagnosis not present

## 2022-06-19 DIAGNOSIS — E1122 Type 2 diabetes mellitus with diabetic chronic kidney disease: Secondary | ICD-10-CM | POA: Diagnosis not present

## 2022-06-19 DIAGNOSIS — I129 Hypertensive chronic kidney disease with stage 1 through stage 4 chronic kidney disease, or unspecified chronic kidney disease: Secondary | ICD-10-CM | POA: Insufficient documentation

## 2022-06-19 DIAGNOSIS — D638 Anemia in other chronic diseases classified elsewhere: Secondary | ICD-10-CM | POA: Diagnosis not present

## 2022-06-19 DIAGNOSIS — N189 Chronic kidney disease, unspecified: Secondary | ICD-10-CM | POA: Diagnosis not present

## 2022-06-19 DIAGNOSIS — E1129 Type 2 diabetes mellitus with other diabetic kidney complication: Secondary | ICD-10-CM | POA: Diagnosis not present

## 2022-06-19 LAB — CBC
HCT: 28.7 % — ABNORMAL LOW (ref 39.0–52.0)
Hemoglobin: 9.5 g/dL — ABNORMAL LOW (ref 13.0–17.0)
MCH: 31.1 pg (ref 26.0–34.0)
MCHC: 33.1 g/dL (ref 30.0–36.0)
MCV: 94.1 fL (ref 80.0–100.0)
Platelets: 270 10*3/uL (ref 150–400)
RBC: 3.05 MIL/uL — ABNORMAL LOW (ref 4.22–5.81)
RDW: 13.6 % (ref 11.5–15.5)
WBC: 5.3 10*3/uL (ref 4.0–10.5)
nRBC: 0 % (ref 0.0–0.2)

## 2022-06-19 LAB — IRON AND TIBC
Iron: 67 ug/dL (ref 45–182)
Saturation Ratios: 26 % (ref 17.9–39.5)
TIBC: 257 ug/dL (ref 250–450)
UIBC: 190 ug/dL

## 2022-06-19 LAB — PROTEIN / CREATININE RATIO, URINE
Creatinine, Urine: 54 mg/dL
Protein Creatinine Ratio: 0.26 mg/mg{Cre} — ABNORMAL HIGH (ref 0.00–0.15)
Total Protein, Urine: 14 mg/dL

## 2022-06-19 LAB — RENAL FUNCTION PANEL
Albumin: 3.8 g/dL (ref 3.5–5.0)
Anion gap: 11 (ref 5–15)
BUN: 57 mg/dL — ABNORMAL HIGH (ref 8–23)
CO2: 23 mmol/L (ref 22–32)
Calcium: 8.6 mg/dL — ABNORMAL LOW (ref 8.9–10.3)
Chloride: 100 mmol/L (ref 98–111)
Creatinine, Ser: 3.94 mg/dL — ABNORMAL HIGH (ref 0.61–1.24)
GFR, Estimated: 14 mL/min — ABNORMAL LOW (ref >60)
Glucose, Bld: 128 mg/dL — ABNORMAL HIGH (ref 70–99)
Phosphorus: 4.1 mg/dL (ref 2.5–4.6)
Potassium: 4.3 mmol/L (ref 3.5–5.1)
Sodium: 134 mmol/L — ABNORMAL LOW (ref 135–145)

## 2022-06-19 LAB — FERRITIN: Ferritin: 45 ng/mL (ref 24–336)

## 2022-06-21 LAB — PTH, INTACT AND CALCIUM
Calcium, Total (PTH): 8.6 mg/dL (ref 8.6–10.2)
PTH: 104 pg/mL — ABNORMAL HIGH (ref 15–65)

## 2022-06-23 ENCOUNTER — Encounter (HOSPITAL_COMMUNITY)
Admission: RE | Admit: 2022-06-23 | Discharge: 2022-06-23 | Disposition: A | Discharge status: Home / Self Care | Payer: Medicare Other | Source: Ambulatory Visit | Attending: Nephrology | Admitting: Nephrology

## 2022-06-23 VITALS — BP 147/55 | HR 52 | Temp 98.2°F | Resp 16

## 2022-06-23 DIAGNOSIS — N184 Chronic kidney disease, stage 4 (severe): Secondary | ICD-10-CM | POA: Diagnosis not present

## 2022-06-23 DIAGNOSIS — D631 Anemia in chronic kidney disease: Secondary | ICD-10-CM | POA: Insufficient documentation

## 2022-06-23 LAB — POCT HEMOGLOBIN-HEMACUE: Hemoglobin: 9.3 g/dL — ABNORMAL LOW (ref 13.0–17.0)

## 2022-06-23 MED ORDER — EPOETIN ALFA-EPBX 3000 UNIT/ML IJ SOLN
3000.0000 [IU] | Freq: Once | INTRAMUSCULAR | Status: AC
  Administered 2022-06-23: 3000 [IU] via SUBCUTANEOUS
  Filled 2022-06-23: qty 1

## 2022-06-23 NOTE — Progress Notes (Signed)
Diagnosis: Anemia in Chronic Kidney Disease  Provider:  Manpreet Bhutani MD  Procedure: Injection  Retacrit (epoetin alfa-epbx), Dose: 3000 Units, Site: subcutaneous, Number of injections: 1  Hgb 9.3. Administered in left arm.  Post Care: Patient declined observation  Discharge: Condition: Good, Destination: Home . AVS Provided  Performed by:  Wyvonne Lenz, RN

## 2022-06-23 NOTE — Addendum Note (Signed)
Encounter addended by: Cathalina Barcia E, RN on: 06/23/2022 1:57 PM  Actions taken: Therapy plan modified

## 2022-06-30 ENCOUNTER — Encounter (HOSPITAL_COMMUNITY): Payer: Medicare Other

## 2022-07-07 ENCOUNTER — Encounter (HOSPITAL_COMMUNITY)
Admission: RE | Admit: 2022-07-07 | Discharge: 2022-07-07 | Disposition: A | Discharge status: Home / Self Care | Payer: Medicare Other | Source: Ambulatory Visit | Attending: Nephrology | Admitting: Nephrology

## 2022-07-07 VITALS — BP 131/41 | HR 64 | Temp 98.2°F | Resp 18

## 2022-07-07 DIAGNOSIS — D631 Anemia in chronic kidney disease: Secondary | ICD-10-CM

## 2022-07-07 DIAGNOSIS — N184 Chronic kidney disease, stage 4 (severe): Secondary | ICD-10-CM | POA: Diagnosis not present

## 2022-07-07 LAB — POCT HEMOGLOBIN-HEMACUE: Hemoglobin: 9.8 g/dL — ABNORMAL LOW (ref 13.0–17.0)

## 2022-07-07 MED ORDER — EPOETIN ALFA-EPBX 3000 UNIT/ML IJ SOLN
3000.0000 [IU] | Freq: Once | INTRAMUSCULAR | Status: AC
  Administered 2022-07-07: 3000 [IU] via SUBCUTANEOUS

## 2022-07-07 NOTE — Addendum Note (Signed)
Encounter addended by: Tosca Pletz E, RN on: 07/07/2022 1:04 PM  Actions taken: Therapy plan modified

## 2022-07-07 NOTE — Progress Notes (Signed)
Diagnosis: Anemia in Chronic Kidney Disease  Provider:  Manpreet Bhutani MD  Procedure: Injection  Retacrit (epoetin alfa-epbx), Dose: 3000 Units, Site: subcutaneous, Number of injections: 1  Hgb 9.8. Administered in left arm.  Post Care: Patient declined observation  Discharge: Condition: Good, Destination: Home . AVS Provided  Performed by:  Wyvonne Lenz, RN

## 2022-07-21 ENCOUNTER — Encounter (HOSPITAL_COMMUNITY)
Admission: RE | Admit: 2022-07-21 | Discharge: 2022-07-21 | Disposition: A | Discharge status: Home / Self Care | Payer: Medicare Other | Source: Ambulatory Visit | Attending: Nephrology | Admitting: Nephrology

## 2022-07-21 VITALS — BP 149/51 | Temp 98.5°F | Resp 18

## 2022-07-21 DIAGNOSIS — D631 Anemia in chronic kidney disease: Secondary | ICD-10-CM | POA: Insufficient documentation

## 2022-07-21 DIAGNOSIS — N184 Chronic kidney disease, stage 4 (severe): Secondary | ICD-10-CM | POA: Diagnosis not present

## 2022-07-21 LAB — POCT HEMOGLOBIN-HEMACUE: Hemoglobin: 9.4 g/dL — ABNORMAL LOW (ref 13.0–17.0)

## 2022-07-21 MED ORDER — EPOETIN ALFA-EPBX 4000 UNIT/ML IJ SOLN
3000.0000 [IU] | Freq: Once | INTRAMUSCULAR | Status: AC
  Administered 2022-07-21: 3000 [IU] via SUBCUTANEOUS
  Filled 2022-07-21: qty 1

## 2022-07-21 NOTE — Progress Notes (Signed)
Diagnosis: Anemia in Chronic Kidney Disease  Provider:  Manpreet Bhutani MD  Procedure: Injection  Retacrit (epoetin alfa-epbx), Dose: 3000 Units, Site: subcutaneous, Number of injections: 1  Hgb 9.4. Administered in left arm.  Post Care: Patient declined observation  Discharge: Condition: Good, Destination: Home . AVS Provided  Performed by:  Marin Shutter, RN

## 2022-07-21 NOTE — Addendum Note (Signed)
Encounter addended by: Wyvonne Lenz, RN on: 07/21/2022 12:28 PM  Actions taken: Therapy plan modified

## 2022-08-04 ENCOUNTER — Encounter (HOSPITAL_COMMUNITY)
Admission: RE | Admit: 2022-08-04 | Discharge: 2022-08-04 | Disposition: A | Discharge status: Home / Self Care | Payer: Medicare Other | Source: Ambulatory Visit | Attending: Nephrology | Admitting: Nephrology

## 2022-08-04 VITALS — BP 137/41 | HR 63 | Temp 98.7°F | Resp 18

## 2022-08-04 DIAGNOSIS — D631 Anemia in chronic kidney disease: Secondary | ICD-10-CM

## 2022-08-04 DIAGNOSIS — N184 Chronic kidney disease, stage 4 (severe): Secondary | ICD-10-CM | POA: Diagnosis not present

## 2022-08-04 LAB — POCT HEMOGLOBIN-HEMACUE: Hemoglobin: 10.6 g/dL — ABNORMAL LOW (ref 13.0–17.0)

## 2022-08-04 MED ORDER — EPOETIN ALFA-EPBX 4000 UNIT/ML IJ SOLN: 3000.0000 [IU] | Freq: Once | INTRAMUSCULAR | Status: DC

## 2022-08-04 NOTE — Progress Notes (Signed)
Diagnosis: Anemia in Chronic Kidney Disease  Provider:  Manpreet Bhutani MD  Procedure: Injection  Retacrit (epoetin alfa-epbx), Dose: 3000 Units, Site: subcutaneous, Number of injections: Not Given  Hgb 10.6  Post Care: Patient declined observation  Discharge: Condition: Good, Destination: Home . AVS provided to patient.   Performed by:  Marin Shutter, RN

## 2022-08-04 NOTE — Addendum Note (Signed)
Encounter addended by: Wyvonne Lenz, RN on: 08/04/2022 3:21 PM  Actions taken: Therapy plan modified

## 2022-08-18 ENCOUNTER — Telehealth: Payer: Self-pay

## 2022-08-18 ENCOUNTER — Encounter (HOSPITAL_COMMUNITY)
Admission: RE | Admit: 2022-08-18 | Discharge: 2022-08-18 | Disposition: A | Discharge status: Home / Self Care | Payer: Medicare Other | Source: Ambulatory Visit | Attending: Nephrology | Admitting: Nephrology

## 2022-08-18 VITALS — BP 154/51 | HR 64 | Temp 98.7°F | Resp 20

## 2022-08-18 DIAGNOSIS — N184 Chronic kidney disease, stage 4 (severe): Secondary | ICD-10-CM | POA: Diagnosis not present

## 2022-08-18 DIAGNOSIS — D631 Anemia in chronic kidney disease: Secondary | ICD-10-CM | POA: Diagnosis not present

## 2022-08-18 LAB — CBC WITH DIFFERENTIAL/PLATELET
Abs Immature Granulocytes: 0.01 10*3/uL (ref 0.00–0.07)
Basophils Absolute: 0 10*3/uL (ref 0.0–0.1)
Basophils Relative: 1 %
Eosinophils Absolute: 0.3 10*3/uL (ref 0.0–0.5)
Eosinophils Relative: 5 %
HCT: 27.4 % — ABNORMAL LOW (ref 39.0–52.0)
Hemoglobin: 9 g/dL — ABNORMAL LOW (ref 13.0–17.0)
Immature Granulocytes: 0 %
Lymphocytes Relative: 35 %
Lymphs Abs: 2 10*3/uL (ref 0.7–4.0)
MCH: 30.4 pg (ref 26.0–34.0)
MCHC: 32.8 g/dL (ref 30.0–36.0)
MCV: 92.6 fL (ref 80.0–100.0)
Monocytes Absolute: 0.6 10*3/uL (ref 0.1–1.0)
Monocytes Relative: 11 %
Neutro Abs: 2.7 10*3/uL (ref 1.7–7.7)
Neutrophils Relative %: 48 %
Platelets: 261 10*3/uL (ref 150–400)
RBC: 2.96 MIL/uL — ABNORMAL LOW (ref 4.22–5.81)
RDW: 13 % (ref 11.5–15.5)
WBC: 5.6 10*3/uL (ref 4.0–10.5)
nRBC: 0 % (ref 0.0–0.2)

## 2022-08-18 LAB — RENAL FUNCTION PANEL
Albumin: 3.8 g/dL (ref 3.5–5.0)
Anion gap: 8 (ref 5–15)
BUN: 49 mg/dL — ABNORMAL HIGH (ref 8–23)
CO2: 23 mmol/L (ref 22–32)
Calcium: 8.4 mg/dL — ABNORMAL LOW (ref 8.9–10.3)
Chloride: 99 mmol/L (ref 98–111)
Creatinine, Ser: 3.74 mg/dL — ABNORMAL HIGH (ref 0.61–1.24)
GFR, Estimated: 15 mL/min — ABNORMAL LOW (ref >60)
Glucose, Bld: 173 mg/dL — ABNORMAL HIGH (ref 70–99)
Phosphorus: 3.4 mg/dL (ref 2.5–4.6)
Potassium: 4.4 mmol/L (ref 3.5–5.1)
Sodium: 130 mmol/L — ABNORMAL LOW (ref 135–145)

## 2022-08-18 LAB — IRON AND TIBC
Iron: 60 ug/dL (ref 45–182)
Saturation Ratios: 25 % (ref 17.9–39.5)
TIBC: 240 ug/dL — ABNORMAL LOW (ref 250–450)
UIBC: 180 ug/dL

## 2022-08-18 LAB — PROTEIN / CREATININE RATIO, URINE
Creatinine, Urine: 59 mg/dL
Protein Creatinine Ratio: 0.32 mg/mg{Cre} — ABNORMAL HIGH (ref 0.00–0.15)
Total Protein, Urine: 19 mg/dL

## 2022-08-18 LAB — POCT HEMOGLOBIN-HEMACUE: Hemoglobin: 9.2 g/dL — ABNORMAL LOW (ref 13.0–17.0)

## 2022-08-18 LAB — FERRITIN: Ferritin: 61 ng/mL (ref 24–336)

## 2022-08-18 MED ORDER — EPOETIN ALFA-EPBX 3000 UNIT/ML IJ SOLN
3000.0000 [IU] | Freq: Once | INTRAMUSCULAR | Status: AC
  Administered 2022-08-18: 3000 [IU] via SUBCUTANEOUS

## 2022-08-18 NOTE — Progress Notes (Signed)
Diagnosis: Anemia in Chronic Kidney Disease  Provider:  Manpreet Bhutani MD  Procedure: Injection  Retacrit (epoetin alfa-epbx), Dose: 3000 Units, Site: subcutaneous, Number of injections: 1  Hgb 9.2  Post Care: Patient declined observation  Discharge: Condition: Good, Destination: Home . AVS provided to patient.   Performed by:  Wyvonne Lenz, RN

## 2022-08-18 NOTE — Addendum Note (Signed)
Encounter addended by: Wyvonne Lenz, RN on: 08/18/2022 2:20 PM  Actions taken: Therapy plan modified

## 2022-08-18 NOTE — Telephone Encounter (Signed)
Spoke with Liborio Nixon at [facility]. Per verbal order from Dr. Wolfgang Phoenix, requested that we collect the following labs at the patient's appointment today: CBC with differential, renal panel, iron panel, ferritin, intact PTH, and urine for protein/creatinine. Orders confirmed via read back.  Wyvonne Lenz, RN

## 2022-08-19 LAB — PARATHYROID HORMONE, INTACT (NO CA): PTH: 109 pg/mL — ABNORMAL HIGH (ref 15–65)

## 2022-08-28 DIAGNOSIS — N2581 Secondary hyperparathyroidism of renal origin: Secondary | ICD-10-CM | POA: Diagnosis not present

## 2022-08-28 DIAGNOSIS — N185 Chronic kidney disease, stage 5: Secondary | ICD-10-CM | POA: Diagnosis not present

## 2022-08-28 DIAGNOSIS — E1122 Type 2 diabetes mellitus with diabetic chronic kidney disease: Secondary | ICD-10-CM | POA: Diagnosis not present

## 2022-08-28 DIAGNOSIS — E8722 Chronic metabolic acidosis: Secondary | ICD-10-CM | POA: Diagnosis not present

## 2022-09-01 ENCOUNTER — Encounter (HOSPITAL_COMMUNITY)
Admission: RE | Admit: 2022-09-01 | Discharge: 2022-09-01 | Disposition: A | Discharge status: Home / Self Care | Payer: Medicare Other | Source: Ambulatory Visit | Attending: Nephrology | Admitting: Nephrology

## 2022-09-01 VITALS — BP 154/59 | HR 71 | Temp 99.0°F | Resp 22

## 2022-09-01 DIAGNOSIS — D631 Anemia in chronic kidney disease: Secondary | ICD-10-CM | POA: Diagnosis not present

## 2022-09-01 DIAGNOSIS — N184 Chronic kidney disease, stage 4 (severe): Secondary | ICD-10-CM | POA: Diagnosis not present

## 2022-09-01 LAB — POCT HEMOGLOBIN-HEMACUE: Hemoglobin: 9.5 g/dL — ABNORMAL LOW (ref 13.0–17.0)

## 2022-09-01 MED ORDER — EPOETIN ALFA-EPBX 3000 UNIT/ML IJ SOLN
3000.0000 [IU] | Freq: Once | INTRAMUSCULAR | Status: AC
  Administered 2022-09-01: 3000 [IU] via SUBCUTANEOUS

## 2022-09-01 NOTE — Progress Notes (Signed)
Diagnosis: Anemia in Chronic Kidney Disease  Provider:  Manpreet Bhutani MD  Procedure: Injection  Retacrit (epoetin alfa-epbx), Dose: 3000 Units, Site: subcutaneous, Number of injections: 1  Hgb 9.5  Post Care: Patient declined observation  Discharge: Condition: Good, Destination: Home . AVS provided to patient.   Performed by:  Wyvonne Lenz, RN

## 2022-09-01 NOTE — Addendum Note (Signed)
Encounter addended by: Wyvonne Lenz, RN on: 09/01/2022 12:46 PM  Actions taken: Therapy plan modified

## 2022-09-02 DIAGNOSIS — D509 Iron deficiency anemia, unspecified: Secondary | ICD-10-CM | POA: Diagnosis not present

## 2022-09-02 DIAGNOSIS — I10 Essential (primary) hypertension: Secondary | ICD-10-CM | POA: Diagnosis not present

## 2022-09-02 DIAGNOSIS — I129 Hypertensive chronic kidney disease with stage 1 through stage 4 chronic kidney disease, or unspecified chronic kidney disease: Secondary | ICD-10-CM | POA: Diagnosis not present

## 2022-09-02 DIAGNOSIS — Z0001 Encounter for general adult medical examination with abnormal findings: Secondary | ICD-10-CM | POA: Diagnosis not present

## 2022-09-02 DIAGNOSIS — E7849 Other hyperlipidemia: Secondary | ICD-10-CM | POA: Diagnosis not present

## 2022-09-15 ENCOUNTER — Encounter (HOSPITAL_COMMUNITY)
Admission: RE | Admit: 2022-09-15 | Discharge: 2022-09-15 | Disposition: A | Discharge status: Home / Self Care | Payer: Medicare Other | Source: Ambulatory Visit | Attending: Nephrology | Admitting: Nephrology

## 2022-09-15 VITALS — BP 137/53 | HR 60 | Temp 97.8°F | Resp 18

## 2022-09-15 DIAGNOSIS — D631 Anemia in chronic kidney disease: Secondary | ICD-10-CM | POA: Diagnosis not present

## 2022-09-15 DIAGNOSIS — N184 Chronic kidney disease, stage 4 (severe): Secondary | ICD-10-CM | POA: Diagnosis not present

## 2022-09-15 LAB — POCT HEMOGLOBIN-HEMACUE: Hemoglobin: 10.1 g/dL — ABNORMAL LOW (ref 13.0–17.0)

## 2022-09-15 MED ORDER — EPOETIN ALFA-EPBX 3000 UNIT/ML IJ SOLN: 3000.0000 [IU] | Freq: Once | INTRAMUSCULAR | Status: DC

## 2022-09-15 NOTE — Progress Notes (Signed)
Diagnosis: Anemia in Chronic Kidney Disease  Provider:  Celso Amy MD  Procedure:  No Injection today. HGB 10.1    Discharge: Condition: Good, Destination: Home . AVS Provided  Performed by:  Enzo Montgomery, RN

## 2022-09-15 NOTE — Addendum Note (Signed)
Encounter addended by: Arrie Senate, RN on: 09/15/2022 12:49 PM  Actions taken: Therapy plan modified

## 2022-09-29 ENCOUNTER — Encounter (HOSPITAL_COMMUNITY)
Admission: RE | Admit: 2022-09-29 | Discharge: 2022-09-29 | Disposition: A | Discharge status: Home / Self Care | Payer: Medicare Other | Source: Ambulatory Visit | Attending: Nephrology | Admitting: Nephrology

## 2022-09-29 VITALS — BP 150/57 | HR 63 | Temp 98.4°F | Resp 18

## 2022-09-29 DIAGNOSIS — D631 Anemia in chronic kidney disease: Secondary | ICD-10-CM

## 2022-09-29 DIAGNOSIS — N184 Chronic kidney disease, stage 4 (severe): Secondary | ICD-10-CM | POA: Diagnosis not present

## 2022-09-29 LAB — POCT HEMOGLOBIN-HEMACUE: Hemoglobin: 9.1 g/dL — ABNORMAL LOW (ref 13.0–17.0)

## 2022-09-29 MED ORDER — EPOETIN ALFA-EPBX 3000 UNIT/ML IJ SOLN
3000.0000 [IU] | Freq: Once | INTRAMUSCULAR | Status: AC
  Administered 2022-09-29: 3000 [IU] via SUBCUTANEOUS

## 2022-09-29 NOTE — Progress Notes (Signed)
Diagnosis: Anemia in Chronic Kidney Disease  Provider:  Celso Amy MD  Procedure: Injection  Retacrit (epoetin alfa-epbx), Dose: 3000 Units, Site: subcutaneous, Number of injections: 1  Hgb. 9.1  Post Care: Observation period completed  Discharge: Condition: Good, Destination: Home . AVS Provided  Performed by:  Daleen Squibb, RN

## 2022-09-29 NOTE — Addendum Note (Signed)
Encounter addended by: Marjo Bicker, RN on: 09/29/2022 2:25 PM  Actions taken: Therapy plan modified

## 2022-10-11 ENCOUNTER — Telehealth: Payer: Self-pay | Admitting: Pharmacy Technician

## 2022-10-11 NOTE — Telephone Encounter (Signed)
Auth Submission: NO AUTH NEEDED Site of care: Site of care: [facility] Payer: [employer] MEDICARE Medication & CPT/J Code(s) submitted: RETACRTI [id number] Route of submission (phone, fax, portal): PORTAL Phone # Fax # Auth type: Buy/Bill PB Units/visits requested: 3000 UNITS Q2WKS Reference number: [id number] Approval from: 09/15/22 to 03/16/23

## 2022-10-13 ENCOUNTER — Encounter (HOSPITAL_COMMUNITY)
Admission: RE | Admit: 2022-10-13 | Discharge: 2022-10-13 | Disposition: A | Discharge status: Home / Self Care | Payer: Medicare Other | Source: Ambulatory Visit | Attending: Nephrology | Admitting: Nephrology

## 2022-10-13 VITALS — BP 152/54 | HR 53 | Temp 98.2°F | Resp 18

## 2022-10-13 DIAGNOSIS — D631 Anemia in chronic kidney disease: Secondary | ICD-10-CM | POA: Diagnosis not present

## 2022-10-13 DIAGNOSIS — N184 Chronic kidney disease, stage 4 (severe): Secondary | ICD-10-CM | POA: Diagnosis not present

## 2022-10-13 LAB — POCT HEMOGLOBIN-HEMACUE: Hemoglobin: 9.2 g/dL — ABNORMAL LOW (ref 13.0–17.0)

## 2022-10-13 MED ORDER — EPOETIN ALFA-EPBX 3000 UNIT/ML IJ SOLN
3000.0000 [IU] | Freq: Once | INTRAMUSCULAR | Status: AC
  Administered 2022-10-13: 3000 [IU] via SUBCUTANEOUS

## 2022-10-13 NOTE — Progress Notes (Signed)
Diagnosis: Anemia in Chronic Kidney Disease  Provider:  Manpreet Bhutani MD  Procedure: Injection  Retacrit (epoetin alfa-epbx), Dose: 3000 Units, Site: subcutaneous, Number of injections: 1  Hgb 9.2  Post Care: Patient declined observation  Discharge: Condition: Good, Destination: Home . AVS provided to patient.   Performed by:  Daleen Squibb, RN

## 2022-10-14 ENCOUNTER — Other Ambulatory Visit: Payer: Self-pay

## 2022-10-27 ENCOUNTER — Encounter (HOSPITAL_COMMUNITY): Payer: Medicare Other

## 2022-10-27 ENCOUNTER — Ambulatory Visit: Payer: Medicare Other

## 2022-10-27 DIAGNOSIS — E611 Iron deficiency: Secondary | ICD-10-CM | POA: Diagnosis not present

## 2022-10-27 DIAGNOSIS — N2581 Secondary hyperparathyroidism of renal origin: Secondary | ICD-10-CM | POA: Diagnosis not present

## 2022-10-27 DIAGNOSIS — N189 Chronic kidney disease, unspecified: Secondary | ICD-10-CM | POA: Diagnosis not present

## 2022-10-27 DIAGNOSIS — R809 Proteinuria, unspecified: Secondary | ICD-10-CM | POA: Diagnosis not present

## 2022-10-27 DIAGNOSIS — N185 Chronic kidney disease, stage 5: Secondary | ICD-10-CM | POA: Diagnosis not present

## 2022-11-02 ENCOUNTER — Other Ambulatory Visit: Payer: Self-pay

## 2022-11-02 ENCOUNTER — Emergency Department (HOSPITAL_COMMUNITY): Payer: Medicare Other

## 2022-11-02 ENCOUNTER — Inpatient Hospital Stay (HOSPITAL_COMMUNITY)
Admission: EM | Admit: 2022-11-02 | Discharge: 2022-11-04 | DRG: 281 | Disposition: A | Discharge status: Home / Self Care | Payer: Medicare Other | Attending: Internal Medicine | Admitting: Internal Medicine

## 2022-11-02 ENCOUNTER — Encounter (HOSPITAL_COMMUNITY): Payer: Self-pay | Admitting: *Deleted

## 2022-11-02 ENCOUNTER — Encounter: Payer: Self-pay | Admitting: Nephrology

## 2022-11-02 DIAGNOSIS — D631 Anemia in chronic kidney disease: Secondary | ICD-10-CM | POA: Diagnosis present

## 2022-11-02 DIAGNOSIS — I214 Non-ST elevation (NSTEMI) myocardial infarction: Principal | ICD-10-CM

## 2022-11-02 DIAGNOSIS — Z79899 Other long term (current) drug therapy: Secondary | ICD-10-CM

## 2022-11-02 DIAGNOSIS — E785 Hyperlipidemia, unspecified: Secondary | ICD-10-CM | POA: Diagnosis not present

## 2022-11-02 DIAGNOSIS — R54 Age-related physical debility: Secondary | ICD-10-CM | POA: Diagnosis present

## 2022-11-02 DIAGNOSIS — I779 Disorder of arteries and arterioles, unspecified: Secondary | ICD-10-CM | POA: Diagnosis not present

## 2022-11-02 DIAGNOSIS — I120 Hypertensive chronic kidney disease with stage 5 chronic kidney disease or end stage renal disease: Secondary | ICD-10-CM | POA: Diagnosis present

## 2022-11-02 DIAGNOSIS — I10 Essential (primary) hypertension: Secondary | ICD-10-CM | POA: Diagnosis present

## 2022-11-02 DIAGNOSIS — Z515 Encounter for palliative care: Secondary | ICD-10-CM | POA: Diagnosis not present

## 2022-11-02 DIAGNOSIS — I2510 Atherosclerotic heart disease of native coronary artery without angina pectoris: Secondary | ICD-10-CM | POA: Diagnosis present

## 2022-11-02 DIAGNOSIS — E1122 Type 2 diabetes mellitus with diabetic chronic kidney disease: Secondary | ICD-10-CM | POA: Diagnosis present

## 2022-11-02 DIAGNOSIS — N185 Chronic kidney disease, stage 5: Secondary | ICD-10-CM | POA: Diagnosis not present

## 2022-11-02 DIAGNOSIS — I472 Ventricular tachycardia, unspecified: Secondary | ICD-10-CM | POA: Diagnosis not present

## 2022-11-02 DIAGNOSIS — Z7982 Long term (current) use of aspirin: Secondary | ICD-10-CM

## 2022-11-02 DIAGNOSIS — Z888 Allergy status to other drugs, medicaments and biological substances status: Secondary | ICD-10-CM

## 2022-11-02 DIAGNOSIS — Z7984 Long term (current) use of oral hypoglycemic drugs: Secondary | ICD-10-CM | POA: Diagnosis not present

## 2022-11-02 DIAGNOSIS — Z743 Need for continuous supervision: Secondary | ICD-10-CM | POA: Diagnosis not present

## 2022-11-02 DIAGNOSIS — I493 Ventricular premature depolarization: Secondary | ICD-10-CM | POA: Diagnosis not present

## 2022-11-02 DIAGNOSIS — J449 Chronic obstructive pulmonary disease, unspecified: Secondary | ICD-10-CM | POA: Diagnosis not present

## 2022-11-02 DIAGNOSIS — R079 Chest pain, unspecified: Secondary | ICD-10-CM | POA: Diagnosis not present

## 2022-11-02 DIAGNOSIS — Z87891 Personal history of nicotine dependence: Secondary | ICD-10-CM

## 2022-11-02 DIAGNOSIS — R6889 Other general symptoms and signs: Secondary | ICD-10-CM | POA: Diagnosis not present

## 2022-11-02 DIAGNOSIS — Z7189 Other specified counseling: Secondary | ICD-10-CM | POA: Diagnosis not present

## 2022-11-02 DIAGNOSIS — R0789 Other chest pain: Secondary | ICD-10-CM | POA: Diagnosis not present

## 2022-11-02 DIAGNOSIS — R918 Other nonspecific abnormal finding of lung field: Secondary | ICD-10-CM | POA: Diagnosis not present

## 2022-11-02 DIAGNOSIS — J439 Emphysema, unspecified: Secondary | ICD-10-CM | POA: Diagnosis not present

## 2022-11-02 HISTORY — DX: Chronic kidney disease, unspecified: N18.9

## 2022-11-02 LAB — PROTIME-INR
INR: 1.1 (ref 0.8–1.2)
Prothrombin Time: 14.1 seconds (ref 11.4–15.2)

## 2022-11-02 LAB — GLUCOSE, CAPILLARY: Glucose-Capillary: 129 mg/dL — ABNORMAL HIGH (ref 70–99)

## 2022-11-02 LAB — BASIC METABOLIC PANEL
Anion gap: 10 (ref 5–15)
BUN: 57 mg/dL — ABNORMAL HIGH (ref 8–23)
CO2: 23 mmol/L (ref 22–32)
Calcium: 8.4 mg/dL — ABNORMAL LOW (ref 8.9–10.3)
Chloride: 102 mmol/L (ref 98–111)
Creatinine, Ser: 4.11 mg/dL — ABNORMAL HIGH (ref 0.61–1.24)
GFR, Estimated: 13 mL/min — ABNORMAL LOW (ref >60)
Glucose, Bld: 132 mg/dL — ABNORMAL HIGH (ref 70–99)
Potassium: 4.5 mmol/L (ref 3.5–5.1)
Sodium: 135 mmol/L (ref 135–145)

## 2022-11-02 LAB — CBC
HCT: 28.6 % — ABNORMAL LOW (ref 39.0–52.0)
Hemoglobin: 9.2 g/dL — ABNORMAL LOW (ref 13.0–17.0)
MCH: 30.3 pg (ref 26.0–34.0)
MCHC: 32.2 g/dL (ref 30.0–36.0)
MCV: 94.1 fL (ref 80.0–100.0)
Platelets: 313 10*3/uL (ref 150–400)
RBC: 3.04 MIL/uL — ABNORMAL LOW (ref 4.22–5.81)
RDW: 13.9 % (ref 11.5–15.5)
WBC: 5.7 10*3/uL (ref 4.0–10.5)
nRBC: 0 % (ref 0.0–0.2)

## 2022-11-02 LAB — TROPONIN I (HIGH SENSITIVITY)
Troponin I (High Sensitivity): 226 ng/L (ref <18)
Troponin I (High Sensitivity): 3070 ng/L (ref <18)

## 2022-11-02 LAB — APTT: aPTT: 27 seconds (ref 24–36)

## 2022-11-02 LAB — HEPARIN LEVEL (UNFRACTIONATED): Heparin Unfractionated: 0.44 [IU]/mL (ref 0.30–0.70)

## 2022-11-02 MED ORDER — CARVEDILOL 3.125 MG PO TABS
6.2500 mg | ORAL_TABLET | Freq: Two times a day (BID) | ORAL | Status: DC
  Administered 2022-11-02 – 2022-11-04 (×4): 6.25 mg via ORAL
  Filled 2022-11-02 (×4): qty 2

## 2022-11-02 MED ORDER — ZOLPIDEM TARTRATE 5 MG PO TABS: 5.0000 mg | ORAL_TABLET | Freq: Every evening | ORAL | Status: DC | PRN

## 2022-11-02 MED ORDER — DOCUSATE SODIUM 283 MG RE ENEM: 1.0000 | ENEMA | RECTAL | Status: DC | PRN

## 2022-11-02 MED ORDER — HEPARIN (PORCINE) 25000 UT/250ML-% IV SOLN
1000.0000 [IU]/h | INTRAVENOUS | Status: AC
  Administered 2022-11-02 – 2022-11-03 (×2): 1000 [IU]/h via INTRAVENOUS
  Filled 2022-11-02 (×2): qty 250

## 2022-11-02 MED ORDER — ALBUTEROL SULFATE (2.5 MG/3ML) 0.083% IN NEBU: 3.0000 mL | INHALATION_SOLUTION | Freq: Four times a day (QID) | RESPIRATORY_TRACT | Status: DC | PRN

## 2022-11-02 MED ORDER — ASPIRIN 81 MG PO CHEW
324.0000 mg | CHEWABLE_TABLET | Freq: Once | ORAL | Status: AC
  Administered 2022-11-02: 324 mg via ORAL
  Filled 2022-11-02: qty 4

## 2022-11-02 MED ORDER — HEPARIN BOLUS VIA INFUSION
4000.0000 [IU] | Freq: Once | INTRAVENOUS | Status: AC
  Administered 2022-11-02: 4000 [IU] via INTRAVENOUS

## 2022-11-02 MED ORDER — CALCITRIOL 0.25 MCG PO CAPS
0.2500 ug | ORAL_CAPSULE | Freq: Every day | ORAL | Status: DC
  Administered 2022-11-02 – 2022-11-04 (×3): 0.25 ug via ORAL
  Filled 2022-11-02 (×2): qty 1

## 2022-11-02 MED ORDER — NITROGLYCERIN 2 % TD OINT
1.0000 [in_us] | TOPICAL_OINTMENT | Freq: Once | TRANSDERMAL | Status: AC
  Administered 2022-11-02: 1 [in_us] via TOPICAL
  Filled 2022-11-02: qty 1

## 2022-11-02 MED ORDER — NEPRO/CARBSTEADY PO LIQD: 237.0000 mL | Freq: Three times a day (TID) | ORAL | Status: DC | PRN

## 2022-11-02 MED ORDER — SORBITOL 70 % SOLN: 30.0000 mL | Status: DC | PRN

## 2022-11-02 MED ORDER — CAMPHOR-MENTHOL 0.5-0.5 % EX LOTN: 1.0000 | TOPICAL_LOTION | Freq: Three times a day (TID) | CUTANEOUS | Status: DC | PRN

## 2022-11-02 MED ORDER — ATORVASTATIN CALCIUM 40 MG PO TABS: 40.0000 mg | ORAL_TABLET | Freq: Every day | ORAL | Status: DC

## 2022-11-02 MED ORDER — HYDRALAZINE HCL 25 MG PO TABS
100.0000 mg | ORAL_TABLET | Freq: Three times a day (TID) | ORAL | Status: DC
  Administered 2022-11-02 – 2022-11-04 (×7): 100 mg via ORAL
  Filled 2022-11-02 (×7): qty 4

## 2022-11-02 MED ORDER — ASPIRIN 81 MG PO TBEC
81.0000 mg | DELAYED_RELEASE_TABLET | Freq: Every day | ORAL | Status: DC
  Administered 2022-11-03 – 2022-11-04 (×2): 81 mg via ORAL
  Filled 2022-11-02 (×3): qty 1

## 2022-11-02 MED ORDER — ATORVASTATIN CALCIUM 40 MG PO TABS
40.0000 mg | ORAL_TABLET | Freq: Every day | ORAL | Status: DC
  Administered 2022-11-02 – 2022-11-04 (×3): 40 mg via ORAL
  Filled 2022-11-02 (×3): qty 1

## 2022-11-02 MED ORDER — ASPIRIN 81 MG PO TBEC: 81.0000 mg | DELAYED_RELEASE_TABLET | Freq: Every day | ORAL | Status: DC

## 2022-11-02 MED ORDER — AMLODIPINE BESYLATE 5 MG PO TABS
10.0000 mg | ORAL_TABLET | Freq: Every day | ORAL | Status: DC
  Administered 2022-11-02 – 2022-11-04 (×3): 10 mg via ORAL
  Filled 2022-11-02 (×4): qty 2

## 2022-11-02 MED ORDER — SODIUM CHLORIDE 0.9% FLUSH: 3.0000 mL | INTRAVENOUS | Status: DC | PRN

## 2022-11-02 MED ORDER — INSULIN ASPART 100 UNIT/ML IJ SOLN: 0.0000 [IU] | Freq: Three times a day (TID) | INTRAMUSCULAR | Status: DC

## 2022-11-02 MED ORDER — ACETAMINOPHEN 325 MG PO TABS: 650.0000 mg | ORAL_TABLET | ORAL | Status: DC | PRN

## 2022-11-02 MED ORDER — SODIUM CHLORIDE 0.9% FLUSH
3.0000 mL | Freq: Two times a day (BID) | INTRAVENOUS | Status: DC
  Administered 2022-11-02 – 2022-11-04 (×4): 3 mL via INTRAVENOUS

## 2022-11-02 MED ORDER — HYDROXYZINE HCL 25 MG PO TABS: 25.0000 mg | ORAL_TABLET | Freq: Three times a day (TID) | ORAL | Status: DC | PRN

## 2022-11-02 MED ORDER — ONDANSETRON HCL 4 MG/2ML IJ SOLN: 4.0000 mg | Freq: Four times a day (QID) | INTRAMUSCULAR | Status: DC | PRN

## 2022-11-02 MED ORDER — SODIUM CHLORIDE 0.9 % IV SOLN: 250.0000 mL | INTRAVENOUS | Status: DC | PRN

## 2022-11-02 MED ORDER — CALCIUM CARBONATE ANTACID 1250 MG/5ML PO SUSP: 500.0000 mg | Freq: Four times a day (QID) | ORAL | Status: DC | PRN

## 2022-11-02 MED ORDER — METOPROLOL SUCCINATE ER 50 MG PO TB24: 50.0000 mg | ORAL_TABLET | Freq: Every day | ORAL | Status: DC

## 2022-11-02 NOTE — ED Provider Notes (Signed)
[place] EMERGENCY DEPARTMENT AT [facility] Provider Note   CSN: [id number] Arrival date & time: 11/02/22  1059     History {Add pertinent medical, surgical, social history, OB history to HPI:1} Chief Complaint  Patient presents with   Chest Pain    Nicholas Swanson is a 89 y.o. male.  HPI     89 y.o. male with medical history significant of COPD, DM, stage 5 CKD, and HTN presenting with chest pain.  Home Medications Prior to Admission medications   Medication Sig Start Date End Date Taking? Authorizing Provider  albuterol (VENTOLIN HFA) 108 (90 Base) MCG/ACT inhaler Inhale 1-2 puffs into the lungs every 6 (six) hours as needed for wheezing or shortness of breath.  02/20/19  Yes [provider]  amLODipine (NORVASC) 10 MG tablet Take 10 mg by mouth daily. 02/06/19  Yes [provider]  aspirin EC 81 MG tablet Take 81 mg by mouth daily.   Yes [provider]  atorvastatin (LIPITOR) 40 MG tablet Take 1 tablet (40 mg total) by mouth daily. 03/20/19 11/02/22 Yes Zigmund Daniel., MD  calcitRIOL (ROCALTROL) 0.25 MCG capsule Take by mouth.   Yes [provider]  carvedilol (COREG) 6.25 MG tablet Take 6.25 mg by mouth 2 (two) times daily.   Yes [provider]  furosemide (LASIX) 20 MG tablet Take 20 mg by mouth 2 (two) times daily.   Yes [provider]  hydrALAZINE (APRESOLINE) 100 MG tablet Take 100 mg by mouth 3 (three) times daily.   Yes [provider]  metoprolol succinate (TOPROL-XL) 50 MG 24 hr tablet Take 1 tablet (50 mg total) by mouth daily. Patient not taking: Reported on 11/02/2022 03/21/19 04/20/19  Zigmund Daniel., MD      Allergies    Ace inhibitors and Simvastatin    Review of Systems   Review of Systems  Physical Exam Updated Vital Signs BP (!) 158/55   Pulse 68   Temp 97.6 F (36.4 C) (Oral)   Resp 20   Ht 5\' 9"  (1.753 m)   Wt 84.8 kg   SpO2 99%   BMI 27.62 kg/m  Physical  Exam  ED Results / Procedures / Treatments   Labs (all labs ordered are listed, but only abnormal results are displayed) Labs Reviewed  BASIC METABOLIC PANEL - Abnormal; Notable for the following components:      Result Value   Glucose, Bld 132 (*)    BUN 57 (*)    Creatinine, Ser 4.11 (*)    Calcium 8.4 (*)    GFR, Estimated 13 (*)    All other components within normal limits  CBC - Abnormal; Notable for the following components:   RBC 3.04 (*)    Hemoglobin 9.2 (*)    HCT 28.6 (*)    All other components within normal limits  TROPONIN I (HIGH SENSITIVITY) - Abnormal; Notable for the following components:   Troponin I (High Sensitivity) 226 (*)    All other components within normal limits  MRSA NEXT GEN BY PCR, NASAL  PROTIME-INR  APTT  HEPARIN LEVEL (UNFRACTIONATED)  TROPONIN I (HIGH SENSITIVITY)    EKG EKG Interpretation Date/Time:  Monday November 02 2022 11:07:21 EDT Ventricular Rate:  69 PR Interval:  186 QRS Duration:  90 QT Interval:  408 QTC Calculation: 438 R Axis:   49  Text Interpretation: Sinus rhythm Probable anteroseptal infarct, old ST elevation, consider inferior injury new ST changes noted Confirmed  by Derwood Kaplan [phone]) on 11/02/2022 1:28:09 PM  Radiology DG Chest 2 View  Result Date: 11/02/2022 CLINICAL DATA:  89 year old male with chest pain EXAM: CHEST - 2 VIEW COMPARISON:  03/18/2019 FINDINGS: Cardiomediastinal silhouette unchanged in size and contour. No evidence of central vascular congestion. No interlobular septal thickening. Stigmata of emphysema, with increased retrosternal airspace, flattened hemidiaphragms, increased AP diameter, and hyperinflation on the AP view. Bronchial wall thickening. Mild reticulonodular opacities in the bilateral lungs. Overall there has been improved aeration compared to the prior plain film. No pneumothorax or pleural effusion. No acute displaced fracture. Degenerative changes of the spine. IMPRESSION: Bronchial  wall thickening and some mild reticulonodular opacities may reflect bronchitis or other atypical infection. Negative for lobar pneumonia. Emphysema Electronically Signed   By: Gilmer Mor D.O.   On: 11/02/2022 15:29    Procedures .Critical Care  Performed by: Derwood Kaplan, MD Authorized by: Derwood Kaplan, MD   Critical care provider statement:    Critical care time (minutes):  48   Critical care was necessary to treat or prevent imminent or life-threatening deterioration of the following conditions:  Cardiac failure   Critical care was time spent personally by me on the following activities:  Development of treatment plan with patient or surrogate, discussions with consultants, evaluation of patient's response to treatment, examination of patient, ordering and review of laboratory studies, ordering and review of radiographic studies, ordering and performing treatments and interventions, pulse oximetry, re-evaluation of patient's condition, review of old charts and obtaining history from patient or surrogate   {Document cardiac monitor, telemetry assessment procedure when appropriate:1}  Medications Ordered in ED Medications  heparin bolus via infusion 4,000 Units (has no administration in time range)  heparin ADULT infusion 100 units/mL (25000 units/273mL) (has no administration in time range)  amLODipine (NORVASC) tablet 10 mg (10 mg Oral Given 11/02/22 1556)  sodium chloride flush (NS) 0.9 % injection 3 mL (has no administration in time range)  sodium chloride flush (NS) 0.9 % injection 3 mL (has no administration in time range)  0.9 %  sodium chloride infusion (has no administration in time range)  acetaminophen (TYLENOL) tablet 650 mg (has no administration in time range)  ondansetron (ZOFRAN) injection 4 mg (has no administration in time range)  zolpidem (AMBIEN) tablet 5 mg (has no administration in time range)  sorbitol 70 % solution 30 mL (has no administration in time range)   docusate sodium (ENEMEEZ) enema 283 mg (has no administration in time range)  camphor-menthol (SARNA) lotion 1 Application (has no administration in time range)    And  hydrOXYzine (ATARAX) tablet 25 mg (has no administration in time range)  calcium carbonate (dosed in mg elemental calcium) suspension 500 mg of elemental calcium (has no administration in time range)  feeding supplement (NEPRO CARB STEADY) liquid 237 mL (has no administration in time range)  insulin aspart (novoLOG) injection 0-6 Units (has no administration in time range)  albuterol (PROVENTIL) (2.5 MG/3ML) 0.083% nebulizer solution 3 mL (has no administration in time range)  aspirin EC tablet 81 mg (has no administration in time range)  atorvastatin (LIPITOR) tablet 40 mg (40 mg Oral Given 11/02/22 1556)  calcitRIOL (ROCALTROL) capsule 0.25 mcg (0.25 mcg Oral Given 11/02/22 1610)  carvedilol (COREG) tablet 6.25 mg (6.25 mg Oral Given 11/02/22 1557)  hydrALAZINE (APRESOLINE) tablet 100 mg (100 mg Oral Given 11/02/22 1556)  aspirin chewable tablet 324 mg (324 mg Oral Given 11/02/22 1238)  nitroGLYCERIN (NITROGLYN) 2 % ointment 1 inch (  1 inch Topical Given 11/02/22 1556)    ED Course/ Medical Decision Making/ A&P   {   Click here for ABCD2, HEART and other calculatorsREFRESH Note before signing :1}                              Medical Decision Making Amount and/or Complexity of Data Reviewed Labs: ordered. Radiology: ordered.  Risk Decision regarding hospitalization.   ***  {Document critical care time when appropriate:1} {Document review of labs and clinical decision tools ie heart score, Chads2Vasc2 etc:1}  {Document your independent review of radiology images, and any outside records:1} {Document your discussion with family members, caretakers, and with consultants:1} {Document social determinants of health affecting pt's care:1} {Document your decision making why or why not admission, treatments were  needed:1} Final Clinical Impression(s) / ED Diagnoses Final diagnoses:  NSTEMI (non-ST elevated myocardial infarction) (HCC)    Rx / DC Orders ED Discharge Orders     None

## 2022-11-02 NOTE — H&P (Signed)
History and Physical    Patient: Nicholas Swanson QIO:[id number] DOB: Jan 11, 1931 DOA: 11/02/2022 DOS: the patient was seen and examined on 11/02/2022 PCP: Mirna Mires, MD  Patient coming from: Home - lives with wife; NOK: Denton Lank Alphonza Feltham, [phone]   Chief Complaint: chest pain  HPI: Nicholas Swanson is a 89 y.o. male with medical history significant of COPD, DM, stage 5 CKD, and HTN presenting with chest pain. He reports that he awoke this AM with L axillary pain.  It was a cramping sensation and intensified so he called his grandson (but didn't tell his wife).  The pain continued so he called 911.  He did not receive NTG but the pain eventually abated on its own and is resolved at this time.  No h/o CAD.  He does have advanced CKD and would not agree to HD although he is full code.    ER Course:  Chest pain.  Troponin 226.  EKG with nonspecific changes that resolved on repeat.  NTG paste applied.  Cardiology will consult.  CKD, unlikely to get cath.  Can stay at [facility] as NSTEMI, on Heparin.     Review of Systems: As mentioned in the history of present illness. All other systems reviewed and are negative. Past Medical History:  Diagnosis Date   CKD (chronic kidney disease)    COPD (chronic obstructive pulmonary disease) (HCC)    Diabetes mellitus    Hypertension    History reviewed. No pertinent surgical history. Social History:  reports that he has quit smoking. His smoking use included cigarettes. He has never used smokeless tobacco. He reports that he does not drink alcohol and does not use drugs.  Allergies  Allergen Reactions   Ace Inhibitors Other (See Comments)    REACTION: renal insufficiency   Simvastatin Other (See Comments)    REACTION: myalgia    History reviewed. No pertinent family history.  Prior to Admission medications   Medication Sig Start Date End Date Taking? Authorizing Provider  albuterol (PROVENTIL) (2.5 MG/3ML) 0.083% nebulizer solution Take 3 mLs  (2.5 mg total) by nebulization every 6 (six) hours as needed for wheezing or shortness of breath. 03/20/19   Zigmund Daniel., MD  albuterol (VENTOLIN HFA) 108 (90 Base) MCG/ACT inhaler Inhale 1-2 puffs into the lungs every 6 (six) hours as needed for wheezing or shortness of breath.  02/20/19   [provider]  amLODipine (NORVASC) 10 MG tablet Take 10 mg by mouth daily. 02/06/19   [provider]  aspirin EC 81 MG tablet Take 81 mg by mouth daily.    [provider]  atorvastatin (LIPITOR) 40 MG tablet Take 1 tablet (40 mg total) by mouth daily. 03/20/19 04/19/19  Zigmund Daniel., MD  glipiZIDE (GLUCOTROL XL) 2.5 MG 24 hr tablet Take 2.5 mg by mouth daily.    [provider]  metFORMIN (GLUCOPHAGE) 1000 MG tablet Take 1,000 mg by mouth 2 (two) times daily with a meal.    [provider]  metoprolol succinate (TOPROL-XL) 50 MG 24 hr tablet Take 1 tablet (50 mg total) by mouth daily. 03/21/19 04/20/19  Zigmund Daniel., MD  sodium polystyrene (KAYEXALATE) powder Take 15 grams daily for 7 days.  Please follow up with your PCP for repeat labs once this is complete. 03/20/19   Zigmund Daniel., MD  terazosin (HYTRIN) 5 MG capsule Take 5 mg by mouth at bedtime.    [provider]  Physical Exam: Vitals:   11/02/22 1114 11/02/22 1115 11/02/22 1130  BP:  (!) 158/61 (!) 158/55  Pulse:  69 68  Resp:  20   Temp:  97.6 F (36.4 C)   TempSrc:  Oral   SpO2:  98% 99%  Weight: 84.8 kg    Height: 5\' 9"  (1.753 m)     General:  Appears calm and comfortable and is in NAD Eyes:  EOMI, normal lids, iris ENT:  grossly normal hearing, lips & tongue, mmm; edentulous Neck:  no LAD, masses or thyromegaly Cardiovascular:  RRR, no m/r/g. No LE edema.  Respiratory:   CTA bilaterally with no wheezes/rales/rhonchi.  Normal respiratory effort. Abdomen:  soft, NT, ND Skin:  no rash or induration seen on limited exam Musculoskeletal:  grossly normal  tone BUE/BLE, good ROM, no bony abnormality Psychiatric:  grossly normal mood and affect, speech fluent and appropriate, AOx3 Neurologic:  CN 2-12 grossly intact, moves all extremities in coordinated fashion    Radiological Exams on Admission: Independently reviewed - see discussion in A/P where applicable  No results found.  EKG: Independently reviewed.   1107 - NSR with rate 69; nonspecific ST changes with concern for ischemia Repeat EKGs without apparent ischemic changes   Labs on Admission: I have personally reviewed the available labs and imaging studies at the time of the admission.  Pertinent labs:    Glucose 132 BUN 57/Creatinine 4.11/GFR 13 - stable HS troponin 226 WBC 5.7 Hgb 9.2 - stable INR 1.1   Assessment and Plan: Principal Problem:   NSTEMI (non-ST elevated myocardial infarction) (HCC) Active Problems:   Dyslipidemia   Essential hypertension   Controlled diabetes mellitus with stage 5 chronic kidney disease not on chronic dialysis (HCC)    NSTEMI -Patient with L axillary/chest pain that came on acutely at risk and resolved spontaneously -History not overly suspicious but EKG with ischemic changes that have since resolved -Elevated initial HS troponin, repeat pending -Consistent with NSTEMI -He has advanced CKD and would not agree to HD; he does not appear to be a candidate for catheterization so will keep at [facility] -Will admit to SDU -Cardiology consultation requested -Start ASA 81 mg daily -NTG for symptom relief (although there is no mortality benefit) -Needs beta blocker  -Will plan to start Heparin drip -Supplemental O2 -Start Lipitor 40 mg qhs for now  HTN -Continue carvedilol, amlodipine, hydralazine -Will also add prn IV hydralazine  HLD -Continue Lipitor -Check lipids  DM -Check A1c -There is no indication to start medication at this time, but will cover with SSI for now  Stage 5 CKD -Advanced CKD, would not agree to HD if  needed -Avoid cath unless absolutely necessary -Nephrology prn order set utilized -Continue calcitriol     Advance Care Planning:   Code Status: Full Code - Code status was discussed with the patient and/or family at the time of admission.  The patient would want to receive full resuscitative measures at this time.   Consults: Cardiology  DVT Prophylaxis: Heparin drip  Family Communication: Wife, family friend were present throughout evaluation  Severity of Illness: The appropriate patient status for this patient is INPATIENT. Inpatient status is judged to be reasonable and necessary in order to provide the required intensity of service to ensure the patient's safety. The patient's presenting symptoms, physical exam findings, and initial radiographic and laboratory data in the context of their chronic comorbidities is felt to place them at high risk for further clinical deterioration. Furthermore, it is  not anticipated that the patient will be medically stable for discharge from the hospital within 2 midnights of admission.   * I certify that at the point of admission it is my clinical judgment that the patient will require inpatient hospital care spanning beyond 2 midnights from the point of admission due to high intensity of service, high risk for further deterioration and high frequency of surveillance required.*  Author: Jonah Blue, MD 11/02/2022 3:21 PM  For on call review [email or web address].

## 2022-11-02 NOTE — ED Triage Notes (Signed)
Pt c/o left sided chest pain that started this morning when he went to get out of bed. Describes it as a cramp, no radiation of pain, with some nausea.

## 2022-11-02 NOTE — ED Notes (Signed)
ED TO INPATIENT HANDOFF REPORT  ED Nurse Name and Phone #: [phone]  S Name/Age/Gender Nicholas Swanson 89 y.o. male Room/Bed: APA16A/APA16A  Code Status   Code Status: Full Code  Home/SNF/Other Home Patient oriented to: self, place, time, and situation Is this baseline? Yes   Triage Complete: Triage complete  Chief Complaint NSTEMI (non-ST elevated myocardial infarction) [facility]) [I21.4]  Triage Note Pt c/o left sided chest pain that started this morning when he went to get out of bed. Describes it as a cramp, no radiation of pain, with some nausea.    Allergies Allergies  Allergen Reactions   Ace Inhibitors Other (See Comments)    REACTION: renal insufficiency   Simvastatin Other (See Comments)    REACTION: myalgia    Level of Care/Admitting Diagnosis ED Disposition     ED Disposition  Admit   Condition  --   Comment  Hospital Area: [facility] [[id number]]  Level of Care: Stepdown [14]  Covid Evaluation: Asymptomatic - no recent exposure (last 10 days) testing not required  Diagnosis: NSTEMI (non-ST elevated myocardial infarction) [facility]) [[id number]]  Admitting Physician: Jonah Blue [[id number]]  Attending Physician: Jonah Blue [[id number]]  Certification:: I certify this patient will need inpatient services for at least 2 midnights  Expected Medical Readiness: 11/05/2022          B Medical/Surgery History Past Medical History:  Diagnosis Date   CKD (chronic kidney disease)    COPD (chronic obstructive pulmonary disease) (HCC)    Diabetes mellitus    Hypertension    History reviewed. No pertinent surgical history.   A IV Location/Drains/Wounds Patient Lines/Drains/Airways Status     Active Line/Drains/Airways     Name Placement date Placement time Site Days   Peripheral IV 11/02/22 20 G Right Antecubital 11/02/22  1139  Antecubital  less than 1            Intake/Output Last 24 hours No intake or output data in the 24 hours ending 11/02/22  1620  Labs/Imaging Results for orders placed or performed during the hospital encounter of 11/02/22 (from the past 48 hour(s))  Basic metabolic panel     Status: Abnormal   Collection Time: 11/02/22 11:27 AM  Result Value Ref Range   Sodium 135 135 - 145 mmol/L   Potassium 4.5 3.5 - 5.1 mmol/L   Chloride 102 98 - 111 mmol/L   CO2 23 22 - 32 mmol/L   Glucose, Bld 132 (H) 70 - 99 mg/dL    Comment: Glucose reference range applies only to samples taken after fasting for at least 8 hours.   BUN 57 (H) 8 - 23 mg/dL   Creatinine, Ser 0.98 (H) 0.61 - 1.24 mg/dL   Calcium 8.4 (L) 8.9 - 10.3 mg/dL   GFR, Estimated 13 (L) >60 mL/min    Comment: (NOTE) Calculated using the CKD-EPI Creatinine Equation (2021)    Anion gap 10 5 - 15    Comment: Performed at [facility], [address] [place], [place] [address]  CBC     Status: Abnormal   Collection Time: 11/02/22 11:27 AM  Result Value Ref Range   WBC 5.7 4.0 - 10.5 K/uL   RBC 3.04 (L) 4.22 - 5.81 MIL/uL   Hemoglobin 9.2 (L) 13.0 - 17.0 g/dL   HCT 78.2 (L) 95.6 - 21.3 %   MCV 94.1 80.0 - 100.0 fL   MCH 30.3 26.0 - 34.0 pg   MCHC 32.2 30.0 - 36.0 g/dL   RDW  13.9 11.5 - 15.5 %   Platelets 313 150 - 400 K/uL   nRBC 0.0 0.0 - 0.2 %    Comment: Performed at [facility], [address] [place], [place] [address]  Troponin I (High Sensitivity)     Status: Abnormal   Collection Time: 11/02/22 11:27 AM  Result Value Ref Range   Troponin I (High Sensitivity) 226 (HH) <18 ng/L    Comment: CRITICAL RESULT CALLED TO, READ BACK BY AND VERIFIED WITH LONG,L AT 12:10PM ON 11/02/22 BY FESTERMAN,C (NOTE) Elevated high sensitivity troponin I (hsTnI) values and significant  changes across serial measurements may suggest ACS but many other  chronic and acute conditions are known to elevate hsTnI results.  Refer to the "Links" section for chest pain algorithms and additional  guidance. Performed at [facility], [address], [place], [place]  [address]   Protime-INR     Status: None   Collection Time: 11/02/22 11:30 AM  Result Value Ref Range   Prothrombin Time 14.1 11.4 - 15.2 seconds   INR 1.1 0.8 - 1.2    Comment: (NOTE) INR goal varies based on device and disease states. Performed at [facility], [address], [place], [place] [address]   APTT     Status: None   Collection Time: 11/02/22 11:30 AM  Result Value Ref Range   aPTT 27 24 - 36 seconds    Comment: Performed at [facility], [address] [place], [place] [address]   DG Chest 2 View  Result Date: 11/02/2022 CLINICAL DATA:  89 year old male with chest pain EXAM: CHEST - 2 VIEW COMPARISON:  03/18/2019 FINDINGS: Cardiomediastinal silhouette unchanged in size and contour. No evidence of central vascular congestion. No interlobular septal thickening. Stigmata of emphysema, with increased retrosternal airspace, flattened hemidiaphragms, increased AP diameter, and hyperinflation on the AP view. Bronchial wall thickening. Mild reticulonodular opacities in the bilateral lungs. Overall there has been improved aeration compared to the prior plain film. No pneumothorax or pleural effusion. No acute displaced fracture. Degenerative changes of the spine. IMPRESSION: Bronchial wall thickening and some mild reticulonodular opacities may reflect bronchitis or other atypical infection. Negative for lobar pneumonia. Emphysema Electronically Signed   By: Gilmer Mor D.O.   On: 11/02/2022 15:29    Pending Labs Unresulted Labs (From admission, onward)     Start     Ordered   11/03/22 0500  Hemoglobin A1c  Tomorrow morning,   R        11/02/22 1350   11/03/22 0500  Lipid panel  Tomorrow morning,   R        11/02/22 1350   11/03/22 0500  CBC  Tomorrow morning,   R        11/02/22 1352   11/03/22 0500  Lipoprotein A (LPA)  Tomorrow morning,   R        11/02/22 1506   11/03/22 0500  Basic metabolic panel  Tomorrow morning,   R        11/02/22 1506   11/02/22 2300  Heparin level  (unfractionated)  Once-Timed,   URGENT        11/02/22 1352   11/02/22 1515  MRSA Next Gen by PCR, Nasal  Once,   R        11/02/22 1514            Vitals/Pain Today's Vitals   11/02/22 1114 11/02/22 1115 11/02/22 1130  BP:  (!) 158/61 (!) 158/55  Pulse:  69 68  Resp:  20   Temp:  97.6 F (36.4 C)   TempSrc:  Oral   SpO2:  98% 99%  Weight: 84.8 kg    Height: 5\' 9"  (1.753 m)      Isolation Precautions No active isolations  Medications Medications  heparin bolus via infusion 4,000 Units (has no administration in time range)  heparin ADULT infusion 100 units/mL (25000 units/288mL) (has no administration in time range)  amLODipine (NORVASC) tablet 10 mg (10 mg Oral Given 11/02/22 1556)  sodium chloride flush (NS) 0.9 % injection 3 mL (has no administration in time range)  sodium chloride flush (NS) 0.9 % injection 3 mL (has no administration in time range)  0.9 %  sodium chloride infusion (has no administration in time range)  acetaminophen (TYLENOL) tablet 650 mg (has no administration in time range)  ondansetron (ZOFRAN) injection 4 mg (has no administration in time range)  zolpidem (AMBIEN) tablet 5 mg (has no administration in time range)  sorbitol 70 % solution 30 mL (has no administration in time range)  docusate sodium (ENEMEEZ) enema 283 mg (has no administration in time range)  camphor-menthol (SARNA) lotion 1 Application (has no administration in time range)    And  hydrOXYzine (ATARAX) tablet 25 mg (has no administration in time range)  calcium carbonate (dosed in mg elemental calcium) suspension 500 mg of elemental calcium (has no administration in time range)  feeding supplement (NEPRO CARB STEADY) liquid 237 mL (has no administration in time range)  insulin aspart (novoLOG) injection 0-6 Units (has no administration in time range)  albuterol (PROVENTIL) (2.5 MG/3ML) 0.083% nebulizer solution 3 mL (has no administration in time range)  aspirin EC tablet 81 mg  (has no administration in time range)  atorvastatin (LIPITOR) tablet 40 mg (40 mg Oral Given 11/02/22 1556)  calcitRIOL (ROCALTROL) capsule 0.25 mcg (0.25 mcg Oral Given 11/02/22 1610)  carvedilol (COREG) tablet 6.25 mg (6.25 mg Oral Given 11/02/22 1557)  hydrALAZINE (APRESOLINE) tablet 100 mg (100 mg Oral Given 11/02/22 1556)  aspirin chewable tablet 324 mg (324 mg Oral Given 11/02/22 1238)  nitroGLYCERIN (NITROGLYN) 2 % ointment 1 inch (1 inch Topical Given 11/02/22 1556)    Mobility walks with person assist     Focused Assessments    R Recommendations: See Admitting Provider Note  Report given to:   Additional Notes:

## 2022-11-02 NOTE — Consult Note (Signed)
Pharmacy Consult Note - Anticoagulation Follow Up  Pharmacy Consult for heparin Indication: chest pain/ACS  PATIENT MEASUREMENTS: Height: 6\' 1"  (185.4 cm) Weight: 68.3 kg (150 lb 9.2 oz) IBW/kg (Calculated) : 79.9 HEPARIN DW (KG): 84.8  VITAL SIGNS: Temp: 98.1 F (36.7 C) (08/19 2000) Temp Source: Oral (08/19 2000) BP: 152/39 (08/19 2115) Pulse Rate: 67 (08/19 2100)  Recent Labs    11/02/22 1127 11/02/22 1130 11/02/22 1521 11/02/22 2239  HGB 9.2*  --   --   --   HCT 28.6*  --   --   --   PLT 313  --   --   --   APTT  --  27  --   --   LABPROT  --  14.1  --   --   INR  --  1.1  --   --   HEPARINUNFRC  --   --   --  0.44  CREATININE 4.11*  --   --   --   TROPONINIHS 226*  --  3,070*  --     Estimated Creatinine Clearance: 11.5 mL/min (A) (by C-G formula based on SCr of 4.11 mg/dL (H)).  PAST MEDICAL HISTORY: Past Medical History:  Diagnosis Date   CKD (chronic kidney disease)    COPD (chronic obstructive pulmonary disease) (HCC)    Diabetes mellitus    Hypertension     ASSESSMENT: 89 y.o. male with PMH T2DM, HLD, HTN, h/o prostate cancer is presenting with chest pain that is not radiating. Troponins elevated at 226. Patient is not on chronic anticoagulation per chart review. Pharmacy has been consulted to initiate and manage heparin intravenous infusion.  Pertinent medications: No chronic anticoag PTA per chart review  Goal(s) of therapy: Heparin level 0.3 - 0.7 units/mL Monitor platelets by anticoagulation protocol: Yes   Baseline anticoagulation labs: Recent Labs    11/02/22 1127 11/02/22 1130  APTT  --  27  INR  --  1.1  HGB 9.2*  --   PLT 313  --     Date Time aPTT/HL Rate/Comment 8/19 2239 HL 0.44 Therapeutic- will cont same rate. No s/sx of bleeding, last Hgb 9.2, plts WNL  PLAN: Continue heparin infusion at 1000 units/hour. Check confirmatory heparin level in 8 hours, then daily once at least two levels are consecutively  therapeutic. Monitor CBC daily while on heparin infusion.  Arabella Merles, PharmD. Clinical Pharmacist 11/02/2022 11:15 PM

## 2022-11-02 NOTE — Consult Note (Signed)
Cardiology Consultation   Patient ID: MELVERN FAUBION MRN: [id number]; DOB: 26-Apr-1930  Admit date: 11/02/2022 Date of Consult: 11/02/2022  PCP:  Mirna Mires, MD   [facility] Providers Cardiologist:  None        Patient Profile:   CHEICK VERT is a 89 y.o. male with a hx of HTN, DM2, CKD V who is being seen 11/02/2022 for the evaluation of chest pain at the request of Dr Salina April.  History of Present Illness:   Mr. Dalley 89 yo male history of HTN, CKD V, DM followed by Dr Wolfgang Phoenix, anemia of chronic disease, COPD, presents with chest pain. Reports onset of what felt like a cramp in his left chest early this morning at rest. No other associated symptoms. Lasted for about 20 mintues and then resolved on its own. Came to ER for evaluation, has been chest pain free since. Active independent 89 yo, independent in all ADLs. Walks his dog regularly, cognitively intact.    K 4.5 Cr 4.11 BUN 57 GFR 13 WBC 5.7 Hgb 9.2 Plt 313 Trop 226--> EKG SR, anteroseptal Qwaves, no acute ischemic changes CXR pending Echo pending    Past Medical History:  Diagnosis Date   CKD (chronic kidney disease)    COPD (chronic obstructive pulmonary disease) (HCC)    Diabetes mellitus    Hypertension     History reviewed. No pertinent surgical history.    Inpatient Medications: Scheduled Meds:  Continuous Infusions:  PRN Meds:   Allergies:    Allergies  Allergen Reactions   Ace Inhibitors Other (See Comments)    REACTION: renal insufficiency   Simvastatin Other (See Comments)    REACTION: myalgia    Social History:   Social History   Socioeconomic History   Marital status: Married    Spouse name: Not on file   Number of children: Not on file   Years of education: Not on file   Highest education level: Not on file  Occupational History   Not on file  Tobacco Use   Smoking status: Former    Types: Cigarettes   Smokeless tobacco: Never  Vaping Use   Vaping status:  Never Used  Substance and Sexual Activity   Alcohol use: No   Drug use: No   Sexual activity: Not on file  Other Topics Concern   Not on file  Social History Narrative   Not on file   Social Determinants of Health   Financial Resource Strain: Not on file  Food Insecurity: Not on file  Transportation Needs: Not on file  Physical Activity: Not on file  Stress: Not on file  Social Connections: Not on file  Intimate Partner Violence: Not on file    Family History:   History reviewed. No pertinent family history.   ROS:  Please see the history of present illness.   All other ROS reviewed and negative.     Physical Exam/Data:   Vitals:   11/02/22 1114 11/02/22 1115 11/02/22 1130  BP:  (!) 158/61 (!) 158/55  Pulse:  69 68  Resp:  20   Temp:  97.6 F (36.4 C)   TempSrc:  Oral   SpO2:  98% 99%  Weight: 84.8 kg    Height: 5\' 9"  (1.753 m)     No intake or output data in the 24 hours ending 11/02/22 1339    11/02/2022   11:14 AM 12/23/2021    1:31 PM 12/09/2021    1:50 PM  Last  3 Weights  Weight (lbs) 187 lb 180 lb 180 lb  Weight (kg) 84.823 kg 81.647 kg 81.647 kg     Body mass index is 27.62 kg/m.  General:  Well nourished, well developed, in no acute distress HEENT: normal Neck: no JVD Vascular: No carotid bruits; Distal pulses 2+ bilaterally Cardiac:  normal S1, S2; RRR; no murmur  Lungs:  clear to auscultation bilaterally, no wheezing, rhonchi or rales  Abd: soft, nontender, no hepatomegaly  Ext: no edema Musculoskeletal:  No deformities, BUE and BLE strength normal and equal Skin: warm and dry  Neuro:  CNs 2-12 intact, no focal abnormalities noted Psych:  Normal affect     Laboratory Data:  High Sensitivity Troponin:   Recent Labs  Lab 11/02/22 1127  TROPONINIHS 226*     Chemistry Recent Labs  Lab 11/02/22 1127  NA 135  K 4.5  CL 102  CO2 23  GLUCOSE 132*  BUN 57*  CREATININE 4.11*  CALCIUM 8.4*  GFRNONAA 13*  ANIONGAP 10    No  results for input(s): "PROT", "ALBUMIN", "AST", "ALT", "ALKPHOS", "BILITOT" in the last 168 hours. Lipids No results for input(s): "CHOL", "TRIG", "HDL", "LABVLDL", "LDLCALC", "CHOLHDL" in the last 168 hours.  Hematology Recent Labs  Lab 11/02/22 1127  WBC 5.7  RBC 3.04*  HGB 9.2*  HCT 28.6*  MCV 94.1  MCH 30.3  MCHC 32.2  RDW 13.9  PLT 313   Thyroid No results for input(s): "TSH", "FREET4" in the last 168 hours.  BNPNo results for input(s): "BNP", "PROBNP" in the last 168 hours.  DDimer No results for input(s): "DDIMER" in the last 168 hours.   Radiology/Studies:  No results found.   Assessment and Plan:   1.NSTEMI - trop up to 226 without established peak, EKG without acute ischemic changes - hemodynamically stable - 89 yo patient with stage V candidate poor cath candidate, he is hemodynamically stable and currentlty chest pain free. Isolated episode of chest pain - best option at this time would be to treat NSTEMI medically. 48 hours of heparin, ASA 81, atorva 40, toprol 50mg . Not an ACE/ARB candidate. Would likely add plavix for medically managed NSTEMI but would want to make sure first no alternative etiology for his trop elevation. Follow peak trop and echo, likely add plavix to his regimen  2. CKD V - avoid nephrotoxic agents - followed by Dr Wolfgang Phoenix  3. HTN -elevated in ER, has not had his oral meds yet. Write for home norvasc and toprol  4.DM2 - defer to primary team, likely needs some adjustements in his regimen given his CKD       For questions or updates, please contact [facility] Please consult [email or web address] for contact info under    Signed, Dina Rich, MD  11/02/2022 1:39 PM

## 2022-11-02 NOTE — Consult Note (Signed)
Pharmacy Consult Note - Anticoagulation  Pharmacy Consult for heparin Indication: chest pain/ACS  PATIENT MEASUREMENTS: Height: 5\' 9"  (175.3 cm) Weight: 84.8 kg (187 lb) IBW/kg (Calculated) : 70.7 HEPARIN DW (KG): 84.8  VITAL SIGNS: Temp: 97.6 F (36.4 C) (08/19 1115) Temp Source: Oral (08/19 1115) BP: 158/55 (08/19 1130) Pulse Rate: 68 (08/19 1130)  Recent Labs    11/02/22 1127  HGB 9.2*  HCT 28.6*  PLT 313  CREATININE 4.11*  TROPONINIHS 226*    Estimated Creatinine Clearance: 11.9 mL/min (A) (by C-G formula based on SCr of 4.11 mg/dL (H)).  PAST MEDICAL HISTORY: Past Medical History:  Diagnosis Date   CKD (chronic kidney disease)    COPD (chronic obstructive pulmonary disease) (HCC)    Diabetes mellitus    Hypertension     ASSESSMENT: 89 y.o. male with PMH T2DM, HLD, HTN, h/o prostate cancer is presenting with chest pain that is not radiating. Troponins elevated at 226. Patient is not on chronic anticoagulation per chart review. Pharmacy has been consulted to initiate and manage heparin intravenous infusion.  Pertinent medications: No chronic anticoag PTA per chart review  Goal(s) of therapy: Heparin level 0.3 - 0.7 units/mL Monitor platelets by anticoagulation protocol: Yes   Baseline anticoagulation labs: Recent Labs    11/02/22 1127  HGB 9.2*  PLT 313    Date Time aPTT/HL Rate/Comment    PLAN: Give 4000 units bolus x1; then start heparin infusion at 1000 units/hour. Check heparin level in 8 hours, then daily once at least two levels are consecutively therapeutic. Monitor CBC daily while on heparin infusion.  Will M. Dareen Piano, PharmD Clinical Pharmacist 11/02/2022 1:52 PM

## 2022-11-03 ENCOUNTER — Inpatient Hospital Stay (HOSPITAL_COMMUNITY): Payer: Medicare Other

## 2022-11-03 ENCOUNTER — Encounter (HOSPITAL_COMMUNITY): Payer: Self-pay | Admitting: Internal Medicine

## 2022-11-03 DIAGNOSIS — R079 Chest pain, unspecified: Secondary | ICD-10-CM

## 2022-11-03 DIAGNOSIS — Z515 Encounter for palliative care: Secondary | ICD-10-CM | POA: Diagnosis not present

## 2022-11-03 DIAGNOSIS — I214 Non-ST elevation (NSTEMI) myocardial infarction: Secondary | ICD-10-CM | POA: Diagnosis not present

## 2022-11-03 DIAGNOSIS — Z7189 Other specified counseling: Secondary | ICD-10-CM | POA: Diagnosis not present

## 2022-11-03 LAB — GLUCOSE, CAPILLARY
Glucose-Capillary: 127 mg/dL — ABNORMAL HIGH (ref 70–99)
Glucose-Capillary: 131 mg/dL — ABNORMAL HIGH (ref 70–99)
Glucose-Capillary: 135 mg/dL — ABNORMAL HIGH (ref 70–99)
Glucose-Capillary: 123 mg/dL — ABNORMAL HIGH (ref 70–99)

## 2022-11-03 LAB — ECHOCARDIOGRAM COMPLETE
AR max vel: 1.05 cm2
AV Area VTI: 1.21 cm2
AV Area mean vel: 1.18 cm2
AV Mean grad: 6 mmHg
AV Peak grad: 13.2 mmHg
Ao pk vel: 1.82 m/s
Area-P 1/2: 1.87 cm2
Height: 73 in
S' Lateral: 3.1 cm
Weight: 2451.52 [oz_av]

## 2022-11-03 LAB — CBC
HCT: 29 % — ABNORMAL LOW (ref 39.0–52.0)
Hemoglobin: 9.4 g/dL — ABNORMAL LOW (ref 13.0–17.0)
MCH: 30.4 pg (ref 26.0–34.0)
MCHC: 32.4 g/dL (ref 30.0–36.0)
MCV: 93.9 fL (ref 80.0–100.0)
Platelets: 339 10*3/uL (ref 150–400)
RBC: 3.09 MIL/uL — ABNORMAL LOW (ref 4.22–5.81)
RDW: 13.8 % (ref 11.5–15.5)
WBC: 7.7 10*3/uL (ref 4.0–10.5)
nRBC: 0 % (ref 0.0–0.2)

## 2022-11-03 LAB — BASIC METABOLIC PANEL
Anion gap: 11 (ref 5–15)
BUN: 49 mg/dL — ABNORMAL HIGH (ref 8–23)
CO2: 19 mmol/L — ABNORMAL LOW (ref 22–32)
Calcium: 8.3 mg/dL — ABNORMAL LOW (ref 8.9–10.3)
Chloride: 103 mmol/L (ref 98–111)
Creatinine, Ser: 3.45 mg/dL — ABNORMAL HIGH (ref 0.61–1.24)
GFR, Estimated: 16 mL/min — ABNORMAL LOW (ref >60)
Glucose, Bld: 114 mg/dL — ABNORMAL HIGH (ref 70–99)
Potassium: 4 mmol/L (ref 3.5–5.1)
Sodium: 133 mmol/L — ABNORMAL LOW (ref 135–145)

## 2022-11-03 LAB — TROPONIN I (HIGH SENSITIVITY): Troponin I (High Sensitivity): 11187 ng/L (ref <18)

## 2022-11-03 LAB — LIPID PANEL
Cholesterol: 103 mg/dL (ref 0–200)
HDL: 42 mg/dL (ref >40)
LDL Cholesterol: 48 mg/dL (ref 0–99)
Total CHOL/HDL Ratio: 2.5 ratio
Triglycerides: 64 mg/dL (ref <150)
VLDL: 13 mg/dL (ref 0–40)

## 2022-11-03 LAB — MRSA NEXT GEN BY PCR, NASAL: MRSA by PCR Next Gen: NOT DETECTED

## 2022-11-03 LAB — HEPARIN LEVEL (UNFRACTIONATED): Heparin Unfractionated: 0.35 [IU]/mL (ref 0.30–0.70)

## 2022-11-03 MED ORDER — CHLORHEXIDINE GLUCONATE CLOTH 2 % EX PADS
6.0000 | MEDICATED_PAD | Freq: Every day | CUTANEOUS | Status: DC
  Administered 2022-11-03 – 2022-11-04 (×2): 6 via TOPICAL

## 2022-11-03 MED ORDER — CLOPIDOGREL BISULFATE 75 MG PO TABS
75.0000 mg | ORAL_TABLET | Freq: Every day | ORAL | Status: DC
  Administered 2022-11-03 – 2022-11-04 (×2): 75 mg via ORAL
  Filled 2022-11-03 (×2): qty 1

## 2022-11-03 NOTE — Plan of Care (Signed)
  Problem: Health Behavior/Discharge Planning: Goal: Ability to manage health-related needs will improve Outcome: Progressing   Problem: Clinical Measurements: Goal: Ability to maintain clinical measurements within normal limits will improve Outcome: Progressing Goal: Will remain free from infection Outcome: Progressing Goal: Diagnostic test results will improve Outcome: Progressing Goal: Respiratory complications will improve Outcome: Progressing Goal: Cardiovascular complication will be avoided Outcome: Progressing   Problem: Activity: Goal: Risk for activity intolerance will decrease Outcome: Progressing   Problem: Nutrition: Goal: Adequate nutrition will be maintained Outcome: Progressing   Problem: Coping: Goal: Level of anxiety will decrease Outcome: Progressing   Problem: Elimination: Goal: Will not experience complications related to bowel motility Outcome: Progressing Goal: Will not experience complications related to urinary retention Outcome: Progressing   Problem: Pain Managment: Goal: General experience of comfort will improve Outcome: Progressing   Problem: Safety: Goal: Ability to remain free from injury will improve Outcome: Progressing   Problem: Skin Integrity: Goal: Risk for impaired skin integrity will decrease Outcome: Progressing   Problem: Education: Goal: Ability to describe self-care measures that may prevent or decrease complications (Diabetes Survival Skills Education) will improve Outcome: Progressing Goal: Individualized Educational Video(s) Outcome: Progressing   Problem: Coping: Goal: Ability to adjust to condition or change in health will improve Outcome: Progressing   Problem: Fluid Volume: Goal: Ability to maintain a balanced intake and output will improve Outcome: Progressing   Problem: Health Behavior/Discharge Planning: Goal: Ability to identify and utilize available resources and services will improve Outcome:  Progressing Goal: Ability to manage health-related needs will improve Outcome: Progressing   Problem: Metabolic: Goal: Ability to maintain appropriate glucose levels will improve Outcome: Progressing   Problem: Nutritional: Goal: Maintenance of adequate nutrition will improve Outcome: Progressing Goal: Progress toward achieving an optimal weight will improve Outcome: Progressing   Problem: Skin Integrity: Goal: Risk for impaired skin integrity will decrease Outcome: Progressing   Problem: Tissue Perfusion: Goal: Adequacy of tissue perfusion will improve Outcome: Progressing   Problem: Education: Goal: Understanding of cardiac disease, CV risk reduction, and recovery process will improve Outcome: Progressing Goal: Individualized Educational Video(s) Outcome: Progressing   Problem: Activity: Goal: Ability to tolerate increased activity will improve Outcome: Progressing   Problem: Cardiac: Goal: Ability to achieve and maintain adequate cardiovascular perfusion will improve Outcome: Progressing   Problem: Health Behavior/Discharge Planning: Goal: Ability to safely manage health-related needs after discharge will improve Outcome: Progressing

## 2022-11-03 NOTE — Progress Notes (Signed)
*  PRELIMINARY RESULTS* Echocardiogram 2D Echocardiogram has been performed.  Stacey Drain 11/03/2022, 3:10 PM

## 2022-11-03 NOTE — Plan of Care (Signed)
  Problem: Education: Goal: Knowledge of General Education information will improve Description: Including pain rating scale, medication(s)/side effects and non-pharmacologic comfort measures Outcome: Progressing   Problem: Health Behavior/Discharge Planning: Goal: Ability to manage health-related needs will improve Outcome: Progressing   Problem: Clinical Measurements: Goal: Ability to maintain clinical measurements within normal limits will improve Outcome: Progressing Goal: Will remain free from infection Outcome: Progressing Goal: Diagnostic test results will improve Outcome: Progressing Goal: Respiratory complications will improve Outcome: Progressing Goal: Cardiovascular complication will be avoided Outcome: Progressing   Problem: Activity: Goal: Risk for activity intolerance will decrease Outcome: Progressing   Problem: Nutrition: Goal: Adequate nutrition will be maintained Outcome: Progressing   Problem: Coping: Goal: Level of anxiety will decrease Outcome: Progressing   Problem: Elimination: Goal: Will not experience complications related to bowel motility Outcome: Progressing Goal: Will not experience complications related to urinary retention Outcome: Progressing   Problem: Pain Managment: Goal: General experience of comfort will improve Outcome: Progressing   Problem: Safety: Goal: Ability to remain free from injury will improve Outcome: Progressing   Problem: Skin Integrity: Goal: Risk for impaired skin integrity will decrease Outcome: Progressing   Problem: Education: Goal: Ability to describe self-care measures that may prevent or decrease complications (Diabetes Survival Skills Education) will improve Outcome: Progressing Goal: Individualized Educational Video(s) Outcome: Progressing   Problem: Coping: Goal: Ability to adjust to condition or change in health will improve Outcome: Progressing   Problem: Fluid Volume: Goal: Ability to  maintain a balanced intake and output will improve Outcome: Progressing   Problem: Health Behavior/Discharge Planning: Goal: Ability to identify and utilize available resources and services will improve Outcome: Progressing Goal: Ability to manage health-related needs will improve Outcome: Progressing   Problem: Metabolic: Goal: Ability to maintain appropriate glucose levels will improve Outcome: Progressing   Problem: Nutritional: Goal: Maintenance of adequate nutrition will improve Outcome: Progressing Goal: Progress toward achieving an optimal weight will improve Outcome: Progressing   Problem: Skin Integrity: Goal: Risk for impaired skin integrity will decrease Outcome: Progressing   Problem: Tissue Perfusion: Goal: Adequacy of tissue perfusion will improve Outcome: Progressing   Problem: Education: Goal: Understanding of cardiac disease, CV risk reduction, and recovery process will improve Outcome: Progressing Goal: Individualized Educational Video(s) Outcome: Progressing   Problem: Activity: Goal: Ability to tolerate increased activity will improve Outcome: Progressing   Problem: Cardiac: Goal: Ability to achieve and maintain adequate cardiovascular perfusion will improve Outcome: Progressing   Problem: Health Behavior/Discharge Planning: Goal: Ability to safely manage health-related needs after discharge will improve Outcome: Progressing   

## 2022-11-03 NOTE — Progress Notes (Addendum)
   Progress Note  Patient Name: Nicholas Swanson Date of Encounter: 11/03/2022  Primary Cardiologist: Dina Rich, MD  Interval Summary   No chest pain or shortness of breath at rest.  Vital Signs    Vitals:   11/03/22 0400 11/03/22 0500 11/03/22 0727 11/03/22 0825  BP: (!) 147/43   (!) 148/55  Pulse: 66     Resp: (!) 21     Temp: 98.3 F (36.8 C)  98.3 F (36.8 C)   TempSrc: Oral  Oral   SpO2: 100%     Weight:  69.5 kg    Height:        Intake/Output Summary (Last 24 hours) at 11/03/2022 0846 Last data filed at 11/03/2022 0826 Gross per 24 hour  Intake 486.27 ml  Output 550 ml  Net -63.73 ml   Filed Weights   11/02/22 1114 11/02/22 1730 11/03/22 0500  Weight: 84.8 kg 68.3 kg 69.5 kg    Physical Exam   GEN: No acute distress.   Neck: No JVD. Cardiac: RRR, 1/6 systolic murmur, no gallop.  Respiratory: Nonlabored. Clear to auscultation bilaterally. GI: Soft, nontender, bowel sounds present. MS: No edema.  ECG/Telemetry    An ECG dated 11/02/2022 was personally reviewed today and demonstrated:  Sinus rhythm with anteroseptal Q waves.  Telemetry shows sinus rhythm with brief burst of NSVT.  Labs    Chemistry Recent Labs  Lab 11/02/22 1127 11/03/22 0451  NA 135 133*  K 4.5 4.0  CL 102 103  CO2 23 19*  GLUCOSE 132* 114*  BUN 57* 49*  CREATININE 4.11* 3.45*  CALCIUM 8.4* 8.3*  GFRNONAA 13* 16*  ANIONGAP 10 11    Hematology Recent Labs  Lab 11/02/22 1127 11/03/22 0451  WBC 5.7 7.7  RBC 3.04* 3.09*  HGB 9.2* 9.4*  HCT 28.6* 29.0*  MCV 94.1 93.9  MCH 30.3 30.4  MCHC 32.2 32.4  RDW 13.9 13.8  PLT 313 339   Cardiac Enzymes Recent Labs  Lab 11/02/22 1127 11/02/22 1521 11/03/22 0451  TROPONINIHS 226* 3,070* 11,187*   Lipid Panel     Component Value Date/Time   CHOL 103 11/03/2022 0451   TRIG 64 11/03/2022 0451   HDL 42 11/03/2022 0451   CHOLHDL 2.5 11/03/2022 0451   VLDL 13 11/03/2022 0451   LDLCALC 48 11/03/2022 0451     Cardiac Studies   Echocardiogram pending.  Assessment & Plan   1.  NSTEMI, high-sensitivity troponin I up to 11,187.  He is chest pain-free and hemodynamically stable, continues on IV heparin.  Also on aspirin, Norvasc, Coreg, and Lipitor.  LDL is 48.  2.  CKD stage V, creatinine currently 3.45.  3.  Essential hypertension.  Systolic blood pressure running 130s to 150s.  He is also on hydralazine.  4.  Type 2 diabetes mellitus, currently on insulin.  Chart reviewed including consultation by Dr. Wyline Mood yesterday.  Plan medical therapy for treatment of ACS given very high risk of contrast nephropathy in the setting of CKD stage V.  Plan echocardiogram today.  Continue 48-hour course of IV heparin.  Will also add Plavix to above regimen.  For questions or updates, please contact [facility] Please consult [email or web address] for contact info under   Signed, Nona Dell, MD  11/03/2022, 8:46 AM

## 2022-11-03 NOTE — Progress Notes (Signed)
Progress Note   Patient: Nicholas Swanson:[id number] DOB: Aug 30, 1930 DOA: 11/02/2022     1 DOS: the patient was seen and examined on 11/03/2022   Brief hospital course: 89yo with h/o COPD, DM, stage 5 CKD, and HTN who presented on 8/19 with NSTEMI.  Full code but would not agree to HD.  Cardiology consulted and agrees that management with heparin drip x 48 hours is reasonable without plan for cath/transfer.  Assessment and Plan:  NSTEMI -Patient with L axillary/chest pain that came on acutely at risk and resolved spontaneously -History not overly suspicious but EKG with ischemic changes that have since resolved -Elevated initial HS troponin and ongoing marked elevation -Consistent with NSTEMI -He has advanced CKD and would not agree to HD; he does not appear to be a candidate for catheterization so will keep at [facility] -Admitted to SDU -Cardiology consultation requested -Start ASA 81 mg daily -NTG for symptom relief (although there is no mortality benefit) -Needs beta blocker  -On Heparin drip x 48 hours  -Cardiology is adding Plavix -Supplemental O2 -Continue Lipitor 40 mg qhs for now   HTN -Continue carvedilol, amlodipine, hydralazine -Will also add prn IV hydralazine   HLD -Continue Lipitor -Check lipids   DM -Check A1c -There is no indication to start medication at this time, but will cover with SSI for now   Stage 5 CKD -Advanced CKD, would not agree to HD if needed -Avoid cath unless absolutely necessary -Nephrology prn order set utilized -Continue calcitriol  Goals of care -He is clear about his goal for full code -However, with his significant NSTEMI, developing ectopic beats, and inability to perform cath given his advanced CKD, his mortality risk is fairly significant -Will consult palliative care for further GOC discussion with patient and family    Consultants: Cardiology  Procedures: None  Antibiotics: None  30 Day Unplanned Readmission Risk Score     Flowsheet Row ED to Hosp-Admission (Current) from 11/02/2022 in [place] [facility]  30 Day Unplanned Readmission Risk Score (%) 18.05 Filed at 11/03/2022 0801       This score is the patient's risk of an unplanned readmission within 30 days of being discharged (0 -100%). The score is based on dignosis, age, lab data, medications, orders, and past utilization.   Low:  0-14.9   Medium: 15-21.9   High: 22-29.9   Extreme: 30 and above           Subjective: No more chest pain, feeling well, no complaints.  Still clear about desire to be full code.  Having ectopy on the monitor.   Objective: Vitals:   11/03/22 1036 11/03/22 1107  BP: (!) 121/34   Pulse: 61   Resp: (!) 26   Temp:  97.8 F (36.6 C)  SpO2: 99%     Intake/Output Summary (Last 24 hours) at 11/03/2022 1253 Last data filed at 11/03/2022 1039 Gross per 24 hour  Intake 551.08 ml  Output 975 ml  Net -423.92 ml   Filed Weights   11/02/22 1114 11/02/22 1730 11/03/22 0500  Weight: 84.8 kg 68.3 kg 69.5 kg    Exam:  General:  Appears calm and comfortable and is in NAD Eyes:  EOMI, normal lids, iris ENT:  grossly normal hearing, lips & tongue, mmm; edentulous Neck:  no LAD, masses or thyromegaly Cardiovascular:  RRR, no m/r/g. No LE edema.  Respiratory:   CTA bilaterally with no wheezes/rales/rhonchi.  Normal respiratory effort. Abdomen:  soft, NT, ND Skin:  no rash or induration seen on limited exam Musculoskeletal:  grossly normal tone BUE/BLE, good ROM, no bony abnormality Psychiatric:  grossly normal mood and affect, speech fluent and appropriate, AOx3 Neurologic:  CN 2-12 grossly intact, moves all extremities in coordinated fashion   Data Reviewed: I have reviewed the patient's lab results since admission.  Pertinent labs for today include:   Glucose 114 BUN 49/Creatinine 3.45/GFR 16 - stable HS troponin 226 -> 3070 -> 11187 Lipids: 103/42/48/64 WBC 7.7 Hgb 9.4    Family Communication:  None present today  Disposition: Status is: Inpatient Remains inpatient appropriate because: NSTEMI  Planned Discharge Destination: Home    Time spent: 35 minutes  Author: Jonah Blue, MD 11/03/2022 12:53 PM  For on call review [email or web address].

## 2022-11-03 NOTE — Progress Notes (Signed)
Patient requested a milkshake and a bacon cheeseburger. Educated patient and grandson on the importance of staying away from fried foods due to the recent heart attack. Patient eating grilled chicken sandwich the hospital provided for dinner.

## 2022-11-03 NOTE — Hospital Course (Signed)
89yo with h/o COPD, DM, stage 5 CKD, and HTN who presented on 8/19 with NSTEMI.  Full code but would not agree to HD.  Cardiology consulted and agrees that management with heparin drip x 48 hours is reasonable without plan for cath/transfer.

## 2022-11-03 NOTE — Consult Note (Signed)
Pharmacy Consult Note - Anticoagulation Follow Up  Pharmacy Consult for heparin Indication: chest pain/ACS  PATIENT MEASUREMENTS: Height: 6\' 1"  (185.4 cm) Weight: 69.5 kg (153 lb 3.5 oz) IBW/kg (Calculated) : 79.9 HEPARIN DW (KG): 84.8  VITAL SIGNS: Temp: 98.3 F (36.8 C) (08/20 0727) Temp Source: Oral (08/20 0727) BP: 147/43 (08/20 0400) Pulse Rate: 66 (08/20 0400)  Recent Labs    11/02/22 1130 11/02/22 1521 11/03/22 0451 11/03/22 0650  HGB  --   --  9.4*  --   HCT  --   --  29.0*  --   PLT  --   --  339  --   APTT 27  --   --   --   LABPROT 14.1  --   --   --   INR 1.1  --   --   --   HEPARINUNFRC  --    < >  --  0.35  CREATININE  --   --  3.45*  --   TROPONINIHS  --    < > 11,187*  --    < > = values in this interval not displayed.    Estimated Creatinine Clearance: 14 mL/min (A) (by C-G formula based on SCr of 3.45 mg/dL (H)).  PAST MEDICAL HISTORY: Past Medical History:  Diagnosis Date   CKD (chronic kidney disease)    COPD (chronic obstructive pulmonary disease) (HCC)    Diabetes mellitus    Hypertension     ASSESSMENT: 89 y.o. male with PMH T2DM, HLD, HTN, h/o prostate cancer is presenting with chest pain that is not radiating. Patient is not on chronic anticoagulation per chart review. Pharmacy has been consulted to initiate and manage heparin intravenous infusion.  Heparin level at goal x2 on 1000 units/hr. CBC appears stable overnight. No bleeding or IV issues noted.   Goal(s) of therapy: Heparin level 0.3 - 0.7 units/mL Monitor platelets by anticoagulation protocol: Yes   Baseline anticoagulation labs: Recent Labs    11/02/22 1127 11/02/22 1130 11/03/22 0451  APTT  --  27  --   INR  --  1.1  --   HGB 9.2*  --  9.4*  PLT 313  --  339    PLAN: Continue heparin infusion at 1000 units/hour. Monitor CBC daily while on heparin infusion.  Sheppard Coil PharmD., BCPS Clinical Pharmacist 11/03/2022 7:44 AM

## 2022-11-03 NOTE — Progress Notes (Signed)
   11/03/22 1134  TOC Brief Assessment  Insurance and Status Reviewed  Patient has primary care physician Yes  Home environment has been reviewed Home with spouse  Prior level of function: independant  Prior/Current Home Services No current home services  Social Determinants of Health Reivew SDOH reviewed no interventions necessary  Readmission risk has been reviewed Yes  Transition of care needs no transition of care needs at this time   Admitted with NSTEMI ST elevated. Wife at bedside. Patient is a full code - Palliative consulted, TOC following.

## 2022-11-03 NOTE — Consult Note (Signed)
Consultation Note Date: 11/03/2022   Patient Name: Nicholas Swanson  DOB: 05/17/1930  MRN: [id number]  Age / Sex: 89 y.o., male  PCP: Mirna Mires, MD Referring Physician: Jonah Blue, MD  Reason for Consultation: Establishing goals of care  HPI/Patient Profile: 89 y.o. male  with past medical history of COPD, DM, CKD 5 declining HD as he saw his daughter die while taking HD treatments, HTN, admitted on 11/02/2022 with NSTEMI deemed not cath candidate treated with heparin 48 hours.   Clinical Assessment and Goals of Care: I have reviewed medical records including EPIC notes, labs and imaging, received report from RN, assessed the patient.  Nicholas Swanson is lying quietly in bed.  He appears chronically ill and somewhat frail.  He is resting comfortably, but wakes easily when I call his name.  He is alert and oriented x 3, able to make his needs known.  His wife Claris Che, son Jillyn Hidden, daughter Jasmine December and Sharon's son are present at bedside.  We then meet at the bedside to discuss diagnosis prognosis, GOC, EOL wishes, disposition and options.  I introduced Palliative Medicine as specialized medical care for people living with serious illness. It focuses on providing relief from the symptoms and stress of a serious illness. The goal is to improve quality of life for both the patient and the family.  We discussed a brief life review of the patient.  Mrs. Krumholz shares that her husband has gotten slower over the last few months.  We then focused on their current illness.  We talk in detail about Nicholas Swanson acute heart attack including, but not limited to, the 3 main ways the heart works, 1) electrical 2) vascular 3) mechanical like a pump.  I shared that he has a vascular problem with blood flow to his heart.  We talked about the treatment plan in detail multiple times including, but not limited to, heparin for 48 hours, Plavix,  other treatments.  We talked about the severity of his heart attack including troponin levels.  We talk about trusted oncologist Dr. Wyline Mood, seen by Dr. Diona Browner earlier today.  I shared that fundamentally, we are not changing things for Nicholas Swanson.  It would be anticipated that he would have the same condition in the future.  Daughter Jasmine December asks about transfer to another center where they treat heart conditions.  I share that Mr. Marlana Salvage does not have need for treatment that we cannot provide here at this hospital.  We talk about no cardiac cath due to chronic health conditions, advanced age, kidney dysfunction.  Mr. Polis states that both his mother and daughter had hemodialysis and he would not accept HD.  The natural disease trajectory and expectations at EOL were discussed.  Advanced directives, concepts specific to code status, artifical feeding and hydration, and rehospitalization were considered and discussed.  We talked about the concept of "treat the treatable, but allowing natural passing".  I share that if we are unable to change Nicholas Swanson's heart condition through medicine, chest compressions and  life support would not change things either.  I encouraged him to think about, pray about what he does and does not want.  Discussed the importance of continued conversation with family and the medical providers regarding overall plan of care and treatment options, ensuring decisions are within the context of the patient's values and GOCs.  Questions and concerns were addressed.   The family was encouraged to call with questions or concerns.  PMT will continue to support holistically.  Conference with attending, bedside nursing staff, transition of care team related to patient condition, needs, goals of care, disposition   HCPOA NEXT OF KIN -wife, Claris Che.  Assisted by son Jillyn Hidden and daughter Jasmine December    SUMMARY OF RECOMMENDATIONS   At this point continue full scope/full code Time for outcomes PMT  to follow   Code Status/Advance Care Planning: Full code - We talked about the concept of "treat the treatable, but allowing natural passing".  I share that if we are unable to change Nicholas Swanson's heart condition through medicine, chest compressions and life support would not change things either.  I encouraged him to think about, pray about what he does and does not want.  Symptom Management:  Per hospitalist/cardiologist, no additional needs at this time.  Palliative Prophylaxis:  Frequent Pain Assessment  Additional Recommendations (Limitations, Scope, Preferences): Full Scope Treatment  Psycho-social/Spiritual:  Desire for further Chaplaincy support:no Additional Recommendations: Caregiving  Support/Resources and Grief/Bereavement Support  Prognosis:  Unable to determine, guarded at this point.  Discharge Planning: To Be Determined      Primary Diagnoses: Present on Admission:  NSTEMI (non-ST elevated myocardial infarction) (HCC)  Dyslipidemia  Essential hypertension  Controlled diabetes mellitus with stage 5 chronic kidney disease not on chronic dialysis (HCC)   I have reviewed the medical record, interviewed the patient and family, and examined the patient. The following aspects are pertinent.  Past Medical History:  Diagnosis Date   CKD (chronic kidney disease)    COPD (chronic obstructive pulmonary disease) (HCC)    Diabetes mellitus    Hypertension    Social History   Socioeconomic History   Marital status: Married    Spouse name: Not on file   Number of children: Not on file   Years of education: Not on file   Highest education level: Not on file  Occupational History   Not on file  Tobacco Use   Smoking status: Former    Types: Cigarettes   Smokeless tobacco: Never  Vaping Use   Vaping status: Never Used  Substance and Sexual Activity   Alcohol use: No   Drug use: No   Sexual activity: Not on file  Other Topics Concern   Not on file  Social  History Narrative   Not on file   Social Determinants of Health   Financial Resource Strain: Not on file  Food Insecurity: No Food Insecurity (11/02/2022)   Hunger Vital Sign    Worried About Running Out of Food in the Last Year: Never true    Ran Out of Food in the Last Year: Never true  Transportation Needs: No Transportation Needs (11/02/2022)   PRAPARE - Administrator, Civil Service (Medical): No    Lack of Transportation (Non-Medical): No  Physical Activity: Not on file  Stress: Not on file  Social Connections: Not on file   History reviewed. No pertinent family history. Scheduled Meds:  amLODipine  10 mg Oral Daily   aspirin EC  81 mg Oral Daily  atorvastatin  40 mg Oral Daily   calcitRIOL  0.25 mcg Oral Daily   carvedilol  6.25 mg Oral BID WC   Chlorhexidine Gluconate Cloth  6 each Topical Daily   clopidogrel  75 mg Oral Daily   hydrALAZINE  100 mg Oral TID   insulin aspart  0-6 Units Subcutaneous TID WC   sodium chloride flush  3 mL Intravenous Q12H   Continuous Infusions:  sodium chloride     heparin 1,000 Units/hr (11/03/22 1312)   PRN Meds:.sodium chloride, acetaminophen, albuterol, calcium carbonate (dosed in mg elemental calcium), camphor-menthol **AND** hydrOXYzine, docusate sodium, feeding supplement (NEPRO CARB STEADY), ondansetron (ZOFRAN) IV, sodium chloride flush, sorbitol, zolpidem Medications Prior to Admission:  Prior to Admission medications   Medication Sig Start Date End Date Taking? Authorizing Provider  albuterol (VENTOLIN HFA) 108 (90 Base) MCG/ACT inhaler Inhale 1-2 puffs into the lungs every 6 (six) hours as needed for wheezing or shortness of breath.  02/20/19  Yes [provider]  amLODipine (NORVASC) 10 MG tablet Take 10 mg by mouth daily. 02/06/19  Yes [provider]  aspirin EC 81 MG tablet Take 81 mg by mouth daily.   Yes [provider]  atorvastatin (LIPITOR) 40 MG tablet Take 1 tablet (40 mg total)  by mouth daily. 03/20/19 11/02/22 Yes Zigmund Daniel., MD  calcitRIOL (ROCALTROL) 0.25 MCG capsule Take by mouth.   Yes [provider]  carvedilol (COREG) 6.25 MG tablet Take 6.25 mg by mouth 2 (two) times daily.   Yes [provider]  furosemide (LASIX) 20 MG tablet Take 20 mg by mouth 2 (two) times daily.   Yes [provider]  hydrALAZINE (APRESOLINE) 100 MG tablet Take 100 mg by mouth 3 (three) times daily.   Yes [provider]  metoprolol succinate (TOPROL-XL) 50 MG 24 hr tablet Take 1 tablet (50 mg total) by mouth daily. Patient not taking: Reported on 11/02/2022 03/21/19 04/20/19  Zigmund Daniel., MD   Allergies  Allergen Reactions   Ace Inhibitors Other (See Comments)    REACTION: renal insufficiency   Simvastatin Other (See Comments)    REACTION: myalgia   Review of Systems  Unable to perform ROS: Age    Physical Exam Vitals and nursing note reviewed.     Vital Signs: BP (!) 131/50   Pulse (!) 58   Temp 97.8 F (36.6 C) (Oral)   Resp 18   Ht 6\' 1"  (1.854 m)   Wt 69.5 kg   SpO2 98%   BMI 20.21 kg/m  Pain Scale: 0-10   Pain Score: 0-No pain   SpO2: SpO2: 98 % O2 Device:SpO2: 98 % O2 Flow Rate: .   IO: Intake/output summary:  Intake/Output Summary (Last 24 hours) at 11/03/2022 1436 Last data filed at 11/03/2022 1039 Gross per 24 hour  Intake 551.08 ml  Output 975 ml  Net -423.92 ml    LBM: Last BM Date : 11/02/22 Baseline Weight: Weight: 84.8 kg Most recent weight: Weight: 69.5 kg     Palliative Assessment/Data:     Time In: 1500 Time Out: 1615 Time Total: 75 minutes  Greater than 50%  of this time was spent counseling and coordinating care related to the above assessment and plan.  Signed by: Katheran Awe, NP   Please contact Palliative Medicine Team phone at [phone] for questions and concerns.  For individual provider: See Loretha Stapler

## 2022-11-04 ENCOUNTER — Telehealth: Payer: Self-pay | Admitting: Physician Assistant

## 2022-11-04 DIAGNOSIS — Z7189 Other specified counseling: Secondary | ICD-10-CM | POA: Diagnosis not present

## 2022-11-04 DIAGNOSIS — Z515 Encounter for palliative care: Secondary | ICD-10-CM | POA: Diagnosis not present

## 2022-11-04 DIAGNOSIS — I214 Non-ST elevation (NSTEMI) myocardial infarction: Secondary | ICD-10-CM | POA: Diagnosis not present

## 2022-11-04 LAB — MAGNESIUM: Magnesium: 1.8 mg/dL (ref 1.7–2.4)

## 2022-11-04 LAB — BASIC METABOLIC PANEL
Anion gap: 8 (ref 5–15)
BUN: 51 mg/dL — ABNORMAL HIGH (ref 8–23)
CO2: 21 mmol/L — ABNORMAL LOW (ref 22–32)
Calcium: 8.4 mg/dL — ABNORMAL LOW (ref 8.9–10.3)
Chloride: 105 mmol/L (ref 98–111)
Creatinine, Ser: 3.38 mg/dL — ABNORMAL HIGH (ref 0.61–1.24)
GFR, Estimated: 17 mL/min — ABNORMAL LOW (ref >60)
Glucose, Bld: 102 mg/dL — ABNORMAL HIGH (ref 70–99)
Potassium: 4.5 mmol/L (ref 3.5–5.1)
Sodium: 134 mmol/L — ABNORMAL LOW (ref 135–145)

## 2022-11-04 LAB — CBC
HCT: 27.6 % — ABNORMAL LOW (ref 39.0–52.0)
Hemoglobin: 9.1 g/dL — ABNORMAL LOW (ref 13.0–17.0)
MCH: 30.6 pg (ref 26.0–34.0)
MCHC: 33 g/dL (ref 30.0–36.0)
MCV: 92.9 fL (ref 80.0–100.0)
Platelets: 315 10*3/uL (ref 150–400)
RBC: 2.97 MIL/uL — ABNORMAL LOW (ref 4.22–5.81)
RDW: 13.9 % (ref 11.5–15.5)
WBC: 7.5 10*3/uL (ref 4.0–10.5)
nRBC: 0 % (ref 0.0–0.2)

## 2022-11-04 LAB — HEPARIN LEVEL (UNFRACTIONATED): Heparin Unfractionated: 0.33 [IU]/mL (ref 0.30–0.70)

## 2022-11-04 LAB — TROPONIN I (HIGH SENSITIVITY): Troponin I (High Sensitivity): 4591 ng/L (ref <18)

## 2022-11-04 LAB — HEMOGLOBIN A1C
Hgb A1c MFr Bld: 5.8 % — ABNORMAL HIGH (ref 4.8–5.6)
Mean Plasma Glucose: 120 mg/dL

## 2022-11-04 LAB — TSH: TSH: 0.952 u[IU]/mL (ref 0.350–4.500)

## 2022-11-04 LAB — GLUCOSE, CAPILLARY
Glucose-Capillary: 103 mg/dL — ABNORMAL HIGH (ref 70–99)
Glucose-Capillary: 110 mg/dL — ABNORMAL HIGH (ref 70–99)

## 2022-11-04 MED ORDER — CLOPIDOGREL BISULFATE 75 MG PO TABS: 75.0000 mg | ORAL_TABLET | Freq: Every day | ORAL | 1 refills | Status: DC

## 2022-11-04 MED ORDER — HEPARIN SODIUM (PORCINE) 5000 UNIT/ML IJ SOLN: 5000.0000 [IU] | Freq: Three times a day (TID) | INTRAMUSCULAR | Status: DC

## 2022-11-04 MED ORDER — ACETAMINOPHEN 325 MG PO TABS: 650.0000 mg | ORAL_TABLET | ORAL | Status: AC | PRN

## 2022-11-04 MED ORDER — CLOPIDOGREL BISULFATE 75 MG PO TABS: 75.0000 mg | ORAL_TABLET | Freq: Every day | ORAL | 1 refills | Status: AC

## 2022-11-04 NOTE — Plan of Care (Signed)
  Problem: Education: Goal: Knowledge of General Education information will improve Description: Including pain rating scale, medication(s)/side effects and non-pharmacologic comfort measures Outcome: Progressing   Problem: Health Behavior/Discharge Planning: Goal: Ability to manage health-related needs will improve Outcome: Progressing   Problem: Clinical Measurements: Goal: Ability to maintain clinical measurements within normal limits will improve Outcome: Progressing Goal: Will remain free from infection Outcome: Progressing Goal: Diagnostic test results will improve Outcome: Progressing Goal: Respiratory complications will improve Outcome: Progressing Goal: Cardiovascular complication will be avoided Outcome: Progressing   Problem: Activity: Goal: Risk for activity intolerance will decrease Outcome: Progressing   Problem: Nutrition: Goal: Adequate nutrition will be maintained Outcome: Progressing   Problem: Coping: Goal: Level of anxiety will decrease Outcome: Progressing   Problem: Elimination: Goal: Will not experience complications related to bowel motility Outcome: Progressing Goal: Will not experience complications related to urinary retention Outcome: Progressing   Problem: Pain Managment: Goal: General experience of comfort will improve Outcome: Progressing   Problem: Safety: Goal: Ability to remain free from injury will improve Outcome: Progressing   Problem: Skin Integrity: Goal: Risk for impaired skin integrity will decrease Outcome: Progressing   Problem: Education: Goal: Ability to describe self-care measures that may prevent or decrease complications (Diabetes Survival Skills Education) will improve Outcome: Progressing Goal: Individualized Educational Video(s) Outcome: Progressing   Problem: Coping: Goal: Ability to adjust to condition or change in health will improve Outcome: Progressing   Problem: Fluid Volume: Goal: Ability to  maintain a balanced intake and output will improve Outcome: Progressing   Problem: Health Behavior/Discharge Planning: Goal: Ability to identify and utilize available resources and services will improve Outcome: Progressing Goal: Ability to manage health-related needs will improve Outcome: Progressing   Problem: Metabolic: Goal: Ability to maintain appropriate glucose levels will improve Outcome: Progressing   Problem: Nutritional: Goal: Maintenance of adequate nutrition will improve Outcome: Progressing Goal: Progress toward achieving an optimal weight will improve Outcome: Progressing   Problem: Skin Integrity: Goal: Risk for impaired skin integrity will decrease Outcome: Progressing   Problem: Tissue Perfusion: Goal: Adequacy of tissue perfusion will improve Outcome: Progressing   Problem: Education: Goal: Understanding of cardiac disease, CV risk reduction, and recovery process will improve Outcome: Progressing Goal: Individualized Educational Video(s) Outcome: Progressing   Problem: Activity: Goal: Ability to tolerate increased activity will improve Outcome: Progressing   Problem: Cardiac: Goal: Ability to achieve and maintain adequate cardiovascular perfusion will improve Outcome: Progressing   Problem: Health Behavior/Discharge Planning: Goal: Ability to safely manage health-related needs after discharge will improve Outcome: Progressing   

## 2022-11-04 NOTE — Consult Note (Addendum)
Pharmacy Consult Note - Anticoagulation Follow Up  Pharmacy Consult for heparin Indication: chest pain/ACS  PATIENT MEASUREMENTS: Height: 6\' 1"  (185.4 cm) Weight: 69.5 kg (153 lb 3.5 oz) IBW/kg (Calculated) : 79.9 HEPARIN DW (KG): 84.8  VITAL SIGNS: Temp: 98.7 F (37.1 C) (08/21 0754) Temp Source: Oral (08/21 0754)  Recent Labs    11/02/22 1130 11/02/22 1521 11/03/22 0451 11/03/22 0650 11/04/22 0442  HGB  --   --  9.4*  --  9.1*  HCT  --   --  29.0*  --  27.6*  PLT  --   --  339  --  315  APTT 27  --   --   --   --   LABPROT 14.1  --   --   --   --   INR 1.1  --   --   --   --   HEPARINUNFRC  --    < >  --    < > 0.33  CREATININE  --   --  3.45*  --   --   TROPONINIHS  --    < > 11,187*  --   --    < > = values in this interval not displayed.    Estimated Creatinine Clearance: 14 mL/min (A) (by C-G formula based on SCr of 3.45 mg/dL (H)).  PAST MEDICAL HISTORY: Past Medical History:  Diagnosis Date   CKD (chronic kidney disease)    COPD (chronic obstructive pulmonary disease) (HCC)    Diabetes mellitus    Hypertension     ASSESSMENT: 89 y.o. male with PMH T2DM, HLD, HTN, h/o prostate cancer is presenting with chest pain that is not radiating. Patient is not on chronic anticoagulation per chart review. Pharmacy has been consulted to initiate and manage heparin intravenous infusion.  Heparin level at goal x3 on 1000 units/hr. CBC appears stable overnight. No bleeding or IV issues noted. Will end the 48-hr treatment on 8/21.   Switch to subcutaneous heparin 5000 units q8h for DVT prophylaxis.  Goal(s) of therapy: Heparin level 0.3 - 0.7 units/mL Monitor platelets by anticoagulation protocol: Yes   Baseline anticoagulation labs: Recent Labs    11/02/22 1127 11/02/22 1130 11/03/22 0451 11/04/22 0442  APTT  --  27  --   --   INR  --  1.1  --   --   HGB 9.2*  --  9.4* 9.1*  PLT 313  --  339 315    PLAN: Switch heparin from IV to [place] at 5000 units every 8  hours. Monitor CBC daily while on heparin infusion.  Tory Emerald 11/04/2022 8:28 AM  I agree with the student's note above. Patient continues to be at goal on IV heparin. Plans are to transition to DVT prophylaxis this afternoon when 48 hours of therapy is complete.   Sheppard Coil PharmD., BCPS Clinical Pharmacist 11/04/2022 10:22 AM

## 2022-11-04 NOTE — Plan of Care (Signed)
  Problem: Education: Goal: Knowledge of General Education information will improve Description: Including pain rating scale, medication(s)/side effects and non-pharmacologic comfort measures Outcome: Completed/Met

## 2022-11-04 NOTE — Progress Notes (Addendum)
Progress Note  Patient Name: MARKIS HOWERY Date of Encounter: 11/04/2022  Primary Cardiologist: Dina Rich, MD  Subjective   Feeling well without complaints. No CP or SOB. Palliative note reviewed, continuing full scope of care, remains full code.  Inpatient Medications    Scheduled Meds:  amLODipine  10 mg Oral Daily   aspirin EC  81 mg Oral Daily   atorvastatin  40 mg Oral Daily   calcitRIOL  0.25 mcg Oral Daily   carvedilol  6.25 mg Oral BID WC   Chlorhexidine Gluconate Cloth  6 each Topical Daily   clopidogrel  75 mg Oral Daily   heparin injection (subcutaneous)  5,000 Units Subcutaneous Q8H   hydrALAZINE  100 mg Oral TID   insulin aspart  0-6 Units Subcutaneous TID WC   sodium chloride flush  3 mL Intravenous Q12H   Continuous Infusions:  sodium chloride     heparin 1,000 Units/hr (11/03/22 1810)   PRN Meds: sodium chloride, acetaminophen, albuterol, calcium carbonate (dosed in mg elemental calcium), camphor-menthol **AND** hydrOXYzine, docusate sodium, feeding supplement (NEPRO CARB STEADY), ondansetron (ZOFRAN) IV, sodium chloride flush, sorbitol, zolpidem   Vital Signs    Vitals:   11/04/22 0400 11/04/22 0500 11/04/22 0754 11/04/22 0840  BP:    (!) 143/49  Pulse:      Resp:      Temp: 98.5 F (36.9 C)  98.7 F (37.1 C)   TempSrc: Oral  Oral   SpO2:      Weight:  69.5 kg    Height:        Intake/Output Summary (Last 24 hours) at 11/04/2022 0848 Last data filed at 11/04/2022 0841 Gross per 24 hour  Intake 622.91 ml  Output 425 ml  Net 197.91 ml      11/04/2022    5:00 AM 11/03/2022    5:00 AM 11/02/2022    5:30 PM  Last 3 Weights  Weight (lbs) 153 lb 3.5 oz 153 lb 3.5 oz 150 lb 9.2 oz  Weight (kg) 69.5 kg 69.5 kg 68.3 kg     Telemetry    NSR occasional PVCs, one couplet (tele triggering for bradycardia due to low amplitude, no true bradycardia seen) - Personally Reviewed  ECG    Pending repeat  - Personally Reviewed  Physical Exam    GEN: No acute distress.  HEENT: Normocephalic, atraumatic, sclera non-icteric. Neck: No JVD. +bilat carotid bruit.  Cardiac: RRR no murmurs, rubs, or gallops.  Respiratory: Diffusely diminished but no wheezing, rales, rhonchi. Breathing is unlabored. Lying flat in bed. GI: Soft, nontender, non-distended, BS +x 4. MS: no deformity. Extremities: No clubbing or cyanosis. No edema. Distal pedal pulses are 2+ and equal bilaterally. Neuro:  AAOx3. Follows commands. Psych:  Responds to questions appropriately with a normal affect.  Labs    High Sensitivity Troponin:   Recent Labs  Lab 11/02/22 1127 11/02/22 1521 11/03/22 0451  TROPONINIHS 226* 3,070* 11,187*      Chemistry Recent Labs  Lab 11/02/22 1127 11/03/22 0451  NA 135 133*  K 4.5 4.0  CL 102 103  CO2 23 19*  GLUCOSE 132* 114*  BUN 57* 49*  CREATININE 4.11* 3.45*  CALCIUM 8.4* 8.3*  GFRNONAA 13* 16*  ANIONGAP 10 11     Hematology Recent Labs  Lab 11/02/22 1127 11/03/22 0451 11/04/22 0442  WBC 5.7 7.7 7.5  RBC 3.04* 3.09* 2.97*  HGB 9.2* 9.4* 9.1*  HCT 28.6* 29.0* 27.6*  MCV 94.1 93.9 92.9  MCH 30.3  30.4 30.6  MCHC 32.2 32.4 33.0  RDW 13.9 13.8 13.9  PLT 313 339 315    Radiology    ECHOCARDIOGRAM COMPLETE  Result Date: 11/03/2022    ECHOCARDIOGRAM REPORT   Patient Name:   JACOBIE AKINA [facility] [address] Date of Exam: 11/03/2022 Medical Rec #:  [id number]      Height:       73.0 in Accession #:    [id number]     Weight:       153.2 lb Date of Birth:  Dec 18, 1930       BSA:          1.921 m Patient Age:    89 years       BP:           158/55 mmHg Patient Gender: M              HR:           68 bpm. [facility] Procedure: 2D Echo, Cardiac Doppler and Color Doppler Indications:    Chest Pain R07.9  History:        Patient has no prior history of Echocardiogram examinations.                 Previous Myocardial Infarction, COPD; Risk Factors:Hypertension,                 Diabetes and Dyslipidemia. Hx of COVID-19.   Sonographer:    Celesta Gentile RCS Referring Phys: [id number] JENNIFER YATES IMPRESSIONS  1. Left ventricular ejection fraction, by estimation, is 60 to 65%. The left ventricle has normal function. The left ventricle demonstrates regional wall motion abnormalities (see scoring diagram/findings for description). There is mild concentric left ventricular hypertrophy. Left ventricular diastolic parameters are consistent with Grade I diastolic dysfunction (impaired relaxation).  2. Right ventricular systolic function is normal. The right ventricular size is normal. There is normal pulmonary artery systolic pressure. The estimated right ventricular systolic pressure is 34.6 mmHg.  3. The mitral valve is degenerative. Trivial mitral valve regurgitation.  4. The aortic valve is tricuspid. There is moderate calcification of the aortic valve. Aortic valve regurgitation is not visualized. Mild aortic valve stenosis. Aortic valve mean gradient measures 6.0 mmHg. Dimentionless index 0.6.  5. The inferior vena cava is normal in size with greater than 50% respiratory variability, suggesting right atrial pressure of 3 mmHg. Comparison(s): No prior Echocardiogram. FINDINGS  Left Ventricle: Left ventricular ejection fraction, by estimation, is 60 to 65%. The left ventricle has normal function. The left ventricle demonstrates regional wall motion abnormalities. The left ventricular internal cavity size was normal in size. There is mild concentric left ventricular hypertrophy. Left ventricular diastolic parameters are consistent with Grade I diastolic dysfunction (impaired relaxation).  LV Wall Scoring: The apical septal segment, apical inferior segment, and apex are hypokinetic. The entire anterior wall, entire lateral wall, anterior septum, inferior wall, mid inferoseptal segment, and basal inferoseptal segment are normal. Right Ventricle: The right ventricular size is normal. No increase in right ventricular wall thickness. Right  ventricular systolic function is normal. There is normal pulmonary artery systolic pressure. The tricuspid regurgitant velocity is 2.81 m/s, and  with an assumed right atrial pressure of 3 mmHg, the estimated right ventricular systolic pressure is 34.6 mmHg. Left Atrium: Left atrial size was normal in size. Right Atrium: Right atrial size was normal in size. Pericardium: There is no evidence of pericardial effusion. Presence of epicardial fat layer. Mitral Valve: The mitral valve is  degenerative in appearance. Trivial mitral valve regurgitation. Tricuspid Valve: The tricuspid valve is grossly normal. Tricuspid valve regurgitation is trivial. Aortic Valve: The aortic valve is tricuspid. There is moderate calcification of the aortic valve. There is moderate aortic valve annular calcification. Aortic valve regurgitation is not visualized. Mild aortic stenosis is present. Aortic valve mean gradient measures 6.0 mmHg. Aortic valve peak gradient measures 13.2 mmHg. Aortic valve area, by VTI measures 1.21 cm. Pulmonic Valve: The pulmonic valve was grossly normal. Pulmonic valve regurgitation is trivial. Aorta: The aortic root is normal in size and structure. Venous: The inferior vena cava is normal in size with greater than 50% respiratory variability, suggesting right atrial pressure of 3 mmHg. IAS/Shunts: No atrial level shunt detected by color flow Doppler.  LEFT VENTRICLE PLAX 2D LVIDd:         4.50 cm   Diastology LVIDs:         3.10 cm   LV e' medial:    5.87 cm/s LV PW:         1.10 cm   LV E/e' medial:  11.6 LV IVS:        1.20 cm   LV e' lateral:   7.51 cm/s LVOT diam:     1.60 cm   LV E/e' lateral: 9.1 LV SV:         51 LV SV Index:   26 LVOT Area:     2.01 cm  RIGHT VENTRICLE RV S prime:     13.90 cm/s TAPSE (M-mode): 2.5 cm LEFT ATRIUM             Index        RIGHT ATRIUM           Index LA diam:        3.60 cm 1.87 cm/m   RA Area:     17.40 cm LA Vol (A2C):   53.7 ml 27.95 ml/m  RA Volume:   46.20 ml   24.05 ml/m LA Vol (A4C):   54.4 ml 28.31 ml/m LA Biplane Vol: 55.3 ml 28.78 ml/m  AORTIC VALVE AV Area (Vmax):    1.05 cm AV Area (Vmean):   1.18 cm AV Area (VTI):     1.21 cm AV Vmax:           182.00 cm/s AV Vmean:          108.000 cm/s AV VTI:            0.422 m AV Peak Grad:      13.2 mmHg AV Mean Grad:      6.0 mmHg LVOT Vmax:         94.60 cm/s LVOT Vmean:        63.300 cm/s LVOT VTI:          0.253 m LVOT/AV VTI ratio: 0.60  AORTA Ao Root diam: 3.50 cm MITRAL VALVE               TRICUSPID VALVE MV Area (PHT): 1.87 cm    TR Peak grad:   31.6 mmHg MV Decel Time: 405 msec    TR Vmax:        281.00 cm/s MV E velocity: 68.10 cm/s MV A velocity: 95.10 cm/s  SHUNTS MV E/A ratio:  0.72        Systemic VTI:  0.25 m  Systemic Diam: 1.60 cm Nona Dell MD Electronically signed by Nona Dell MD Signature Date/Time: 11/03/2022/3:18:41 PM    Final    DG Chest 2 View  Result Date: 11/02/2022 CLINICAL DATA:  89 year old male with chest pain EXAM: CHEST - 2 VIEW COMPARISON:  03/18/2019 FINDINGS: Cardiomediastinal silhouette unchanged in size and contour. No evidence of central vascular congestion. No interlobular septal thickening. Stigmata of emphysema, with increased retrosternal airspace, flattened hemidiaphragms, increased AP diameter, and hyperinflation on the AP view. Bronchial wall thickening. Mild reticulonodular opacities in the bilateral lungs. Overall there has been improved aeration compared to the prior plain film. No pneumothorax or pleural effusion. No acute displaced fracture. Degenerative changes of the spine. IMPRESSION: Bronchial wall thickening and some mild reticulonodular opacities may reflect bronchitis or other atypical infection. Negative for lobar pneumonia. Emphysema Electronically Signed   By: Gilmer Mor D.O.   On: 11/02/2022 15:29    Cardiac Studies   2D echo 11/03/22  1. Left ventricular ejection fraction, by estimation, is 60 to 65%. The  left  ventricle has normal function. The left ventricle demonstrates  regional wall motion abnormalities (see scoring diagram/findings for  description). There is mild concentric left  ventricular hypertrophy. Left ventricular diastolic parameters are  consistent with Grade I diastolic dysfunction (impaired relaxation).   2. Right ventricular systolic function is normal. The right ventricular  size is normal. There is normal pulmonary artery systolic pressure. The  estimated right ventricular systolic pressure is 34.6 mmHg.   3. The mitral valve is degenerative. Trivial mitral valve regurgitation.   4. The aortic valve is tricuspid. There is moderate calcification of the  aortic valve. Aortic valve regurgitation is not visualized. Mild aortic  valve stenosis. Aortic valve mean gradient measures 6.0 mmHg.  Dimentionless index 0.6.   5. The inferior vena cava is normal in size with greater than 50%  respiratory variability, suggesting right atrial pressure of 3 mmHg.   Comparison(s): No prior Echocardiogram.    Patient Profile     89 y.o. male with HTN, DM2, CKD V, anemia of chronic disease, COPD, presented with chest pain/NSTEMI.  Assessment & Plan    1. NSTEMI, presumed CAD - hsTroponin 11k - 2D echo EF 60-65%, G1DD, trivial MR, mild AS - per notes, plan to treat conservatively given advanced renal disease/risk of requiring HD if contrast pursued with advanced age - continuing heparin x 48 hours - order updated to request pharmacy team change to DVT ppx after 48 hours of therapy - now on DAPT as part of medical therapy (ASA + Plavix) - continue amlodipine, carvedilol, atorvastatin, hydralazine  2. CKD stage V - BMET pending, admitted at 4.11 and yesterday 3.45 - per notes, would not agree to HD if needed  3. Essential HTN - manage in context above  4. Occasional PVCs - EF normal - K ok, add TSH/Mg to labs  - continue BB  5. Mild AS - no intervention needed at this time, follow  clinically  6. Carotid bruit - per d/w Dr. Diona Browner, anticipate medical management as above, reserving additional testing for new neurovascular symptoms  Remainder per primary team including - anemia - type 2 DM  For questions or updates, please contact [facility] Please consult [email or web address] for contact info under Cardiology/STEMI.  Signed, Ronie Spies, PA-C 11/04/2022, 8:48 AM     Attending note:  Patient seen and examined.  Reviewed interval chart since yesterday.  He reports no chest pain or shortness of breath at rest,  has been up some in chair and moved around in his room. He wants to go home.  He is afebrile, heart rate in the 60s to 70s in sinus rhythm with occasional PVCs by telemetry.  Systolic running 811-914 range.  Lungs are clear.  Bilateral carotid bruits present.  Cardiac exam with RRR and 1/6 systolic murmur without gallop.  Follow-up lab work reviewed, hemoglobin relatively stable at 9.1, follow-up creatinine pending, was 3.45 yesterday.  Echocardiogram demonstrates overall LVEF 60 to 65% with septal apical and inferoapical hypokinesis, also mild aortic stenosis.  Continue medical management of NSTEMI and also presumed carotid artery disease based on bruits.  He is high risk for contrast nephropathy and renal failure in light of CKD stage V at baseline.  Palliative care consultation noted, he remains full code at this time.  Plan to transition off heparin today, transfer to telemetry and increase activity.  Could potentially go home late this afternoon if he remains asymptomatic.  Discussed with hospitalist.  Jonelle Sidle, M.D., F.A.C.C.

## 2022-11-04 NOTE — Discharge Summary (Signed)
Physician Discharge Summary   Patient: Nicholas Swanson MRN: [id number] DOB: 1930-08-02  Admit date:     11/02/2022  Discharge date: 11/04/22  Discharge Physician: Jonah Blue   PCP: Mirna Mires, MD   Recommendations at discharge:   You had a significant heart attack but the troponin level is improving and your pain has resolved; return to the ER/call 911 if chest pain recurs Add Plavix once daily indefinitely Take other home medications as prescribed Follow up with cardiology on 9/5 at 2:30 Follow up with PCP in 1-2 weeks  Discharge Diagnoses: Principal Problem:   NSTEMI (non-ST elevated myocardial infarction) [facility]) Active Problems:   Dyslipidemia   Essential hypertension   Controlled diabetes mellitus with stage 5 chronic kidney disease not on chronic dialysis (HCC)   Goals of care, counseling/discussion    Hospital Course: 89yo with h/o COPD, DM, stage 5 CKD, and HTN who presented on 8/19 with NSTEMI.  Full code but would not agree to HD.  Cardiology consulted and agrees that management with heparin drip x 48 hours is reasonable without plan for cath/transfer.  Assessment and Plan:  NSTEMI -Patient with L axillary/chest pain that came on acutely at risk and resolved spontaneously -History not overly suspicious but EKG with ischemic changes that have since resolved -Elevated initial HS troponin and ongoing marked elevation -Consistent with NSTEMI -He has advanced CKD and would not agree to HD; he does not appear to be a candidate for catheterization so will keep at [facility] -Admitted to SDU -Cardiology consultation requested -Start ASA 81 mg daily -NTG for symptom relief (although there is no mortality benefit) -Needs beta blocker  -On Heparin drip x 48 hours, now off and no further chest pain -Cardiology is adding Plavix -Continue Lipitor 40 mg qhs for now -Palliative care met with patient, he understands critical nature of the situation and lack of intervention  available -He wants to go home and spend time with his dog and family   HTN -Continue carvedilol, amlodipine, hydralazine   HLD -Continue Lipitor -LDL 48, likely does not need further lipid monitoring   DM -A1c 5.8 -No home meds and does not need them -Would not suggest ongoing monitoring of A1c/glucose at this time   Stage 5 CKD -Advanced CKD, would not agree to HD if needed -Avoid cath unless absolutely necessary -Nephrology prn order set utilized -Continue calcitriol   Goals of care -With his significant NSTEMI, developing ectopic beats, and inability to perform cath given his advanced CKD, his mortality risk is fairly significant -Consulted palliative care for further GOC discussion with patient and family -He remains conflicted about this issue and does not want to change code status from full code at this time       Consultants: Cardiology Palliative care   Procedures: None   Antibiotics: None    Disposition: Home Diet recommendation:  Cardiac diet DISCHARGE MEDICATION: Allergies as of 11/04/2022       Reactions   Ace Inhibitors Other (See Comments)   REACTION: renal insufficiency   Simvastatin Other (See Comments)   REACTION: myalgia        Medication List     STOP taking these medications    metoprolol succinate 50 MG 24 hr tablet Commonly known as: TOPROL-XL       TAKE these medications    acetaminophen 325 MG tablet Commonly known as: TYLENOL Take 2 tablets (650 mg total) by mouth every 4 (four) hours as needed for headache or mild pain.  albuterol 108 (90 Base) MCG/ACT inhaler Commonly known as: VENTOLIN HFA Inhale 1-2 puffs into the lungs every 6 (six) hours as needed for wheezing or shortness of breath.   amLODipine 10 MG tablet Commonly known as: NORVASC Take 10 mg by mouth daily.   aspirin EC 81 MG tablet Take 81 mg by mouth daily.   atorvastatin 40 MG tablet Commonly known as: LIPITOR Take 1 tablet (40 mg total) by  mouth daily.   calcitRIOL 0.25 MCG capsule Commonly known as: ROCALTROL Take by mouth.   carvedilol 6.25 MG tablet Commonly known as: COREG Take 6.25 mg by mouth 2 (two) times daily.   clopidogrel 75 MG tablet Commonly known as: PLAVIX Take 1 tablet (75 mg total) by mouth daily. Start taking on: November 05, 2022   furosemide 20 MG tablet Commonly known as: LASIX Take 20 mg by mouth 2 (two) times daily.   hydrALAZINE 100 MG tablet Commonly known as: APRESOLINE Take 100 mg by mouth 3 (three) times daily.        Follow-up Information     Furth, Cadence H, PA-C Follow up.   Specialty: Cardiology Why: [facility] location at [facility] - cardiology follow-up arranged on 11/19/22 at 2:30pm. Arrive by 2:15pm to check in. Cadence is one of our PAs with our cardiology team. Contact information: [address] [place] [address] [phone]                Discharge Exam: Ceasar Mons Weights   11/02/22 1730 11/03/22 0500 11/04/22 0500  Weight: 68.3 kg 69.5 kg 69.5 kg    30 Day Unplanned Readmission Risk Score    Flowsheet Row ED to Hosp-Admission (Current) from 11/02/2022 in [facility]  30 Day Unplanned Readmission Risk Score (%) 18.73 Filed at 11/04/2022 1200       This score is the patient's risk of an unplanned readmission within 30 days of being discharged (0 -100%). The score is based on dignosis, age, lab data, medications, orders, and past utilization.   Low:  0-14.9   Medium: 15-21.9   High: 22-29.9   Extreme: 30 and above           Subjective: No further chest pain, unsure about code status but currently wants to be full code, really wants to go home.   Objective: Vitals:   11/04/22 1143 11/04/22 1200  BP:  (!) 130/38  Pulse:  65  Resp:    Temp: 98.3 F (36.8 C)   SpO2:  98%    Intake/Output Summary (Last 24 hours) at 11/04/2022 1402 Last data filed at 11/04/2022 1401 Gross per 24 hour  Intake  872.6 ml  Output 550 ml  Net 322.6 ml   Filed Weights   11/02/22 1730 11/03/22 0500 11/04/22 0500  Weight: 68.3 kg 69.5 kg 69.5 kg    Exam:  General:  Appears calm and comfortable and is in NAD Eyes:  EOMI, normal lids, iris ENT:  grossly normal hearing, lips & tongue, mmm; edentulous Neck:  no LAD, masses or thyromegaly Cardiovascular:  RRR, no m/r/g. No LE edema.  Respiratory:   CTA bilaterally with no wheezes/rales/rhonchi.  Normal respiratory effort. Abdomen:  soft, NT, ND Skin:  no rash or induration seen on limited exam Musculoskeletal:  grossly normal tone BUE/BLE, good ROM, no bony abnormality Psychiatric:  grossly normal mood and affect, speech fluent and appropriate, AOx3 Neurologic:  CN 2-12 grossly intact, moves all extremities in coordinated fashion  Data Reviewed: I have reviewed the patient's lab results since admission.  Pertinent labs for today include:   BUN 51/Creatinine 3.38/GFR 17 WBC 7.5 Hgb 9.1 Troponin improved to 4591 Normal TSH    Condition at discharge: stable  The results of significant diagnostics from this hospitalization (including imaging, microbiology, ancillary and laboratory) are listed below for reference.   Imaging Studies: ECHOCARDIOGRAM COMPLETE  Result Date: 11/03/2022    ECHOCARDIOGRAM REPORT   Patient Name:   Nicholas Swanson [facility] Date of Exam: 11/03/2022 Medical Rec #:  [id number]      Height:       73.0 in Accession #:    [id number]     Weight:       153.2 lb Date of Birth:  Jun 29, 1930       BSA:          1.921 m Patient Age:    89 years       BP:           158/55 mmHg Patient Gender: M              HR:           68 bpm. [facility]am [facility] Procedure: 2D Echo, Cardiac Doppler and Color Doppler Indications:    Chest Pain R07.9  History:        Patient has no prior history of Echocardiogram examinations.                 Previous Myocardial Infarction, COPD; Risk Factors:Hypertension,                 Diabetes and Dyslipidemia. Hx of  COVID-19.  Sonographer:    Celesta Gentile RCS Referring Phys: [id number] Meika Earll IMPRESSIONS  1. Left ventricular ejection fraction, by estimation, is 60 to 65%. The left ventricle has normal function. The left ventricle demonstrates regional wall motion abnormalities (see scoring diagram/findings for description). There is mild concentric left ventricular hypertrophy. Left ventricular diastolic parameters are consistent with Grade I diastolic dysfunction (impaired relaxation).  2. Right ventricular systolic function is normal. The right ventricular size is normal. There is normal pulmonary artery systolic pressure. The estimated right ventricular systolic pressure is 34.6 mmHg.  3. The mitral valve is degenerative. Trivial mitral valve regurgitation.  4. The aortic valve is tricuspid. There is moderate calcification of the aortic valve. Aortic valve regurgitation is not visualized. Mild aortic valve stenosis. Aortic valve mean gradient measures 6.0 mmHg. Dimentionless index 0.6.  5. The inferior vena cava is normal in size with greater than 50% respiratory variability, suggesting right atrial pressure of 3 mmHg. Comparison(s): No prior Echocardiogram. FINDINGS  Left Ventricle: Left ventricular ejection fraction, by estimation, is 60 to 65%. The left ventricle has normal function. The left ventricle demonstrates regional wall motion abnormalities. The left ventricular internal cavity size was normal in size. There is mild concentric left ventricular hypertrophy. Left ventricular diastolic parameters are consistent with Grade I diastolic dysfunction (impaired relaxation).  LV Wall Scoring: The apical septal segment, apical inferior segment, and apex are hypokinetic. The entire anterior wall, entire lateral wall, anterior septum, inferior wall, mid inferoseptal segment, and basal inferoseptal segment are normal. Right Ventricle: The right ventricular size is normal. No increase in right ventricular wall thickness.  Right ventricular systolic function is normal. There is normal pulmonary artery systolic pressure. The tricuspid regurgitant velocity is 2.81 m/s, and  with an assumed right atrial pressure of 3 mmHg, the estimated right ventricular systolic pressure  is 34.6 mmHg. Left Atrium: Left atrial size was normal in size. Right Atrium: Right atrial size was normal in size. Pericardium: There is no evidence of pericardial effusion. Presence of epicardial fat layer. Mitral Valve: The mitral valve is degenerative in appearance. Trivial mitral valve regurgitation. Tricuspid Valve: The tricuspid valve is grossly normal. Tricuspid valve regurgitation is trivial. Aortic Valve: The aortic valve is tricuspid. There is moderate calcification of the aortic valve. There is moderate aortic valve annular calcification. Aortic valve regurgitation is not visualized. Mild aortic stenosis is present. Aortic valve mean gradient measures 6.0 mmHg. Aortic valve peak gradient measures 13.2 mmHg. Aortic valve area, by VTI measures 1.21 cm. Pulmonic Valve: The pulmonic valve was grossly normal. Pulmonic valve regurgitation is trivial. Aorta: The aortic root is normal in size and structure. Venous: The inferior vena cava is normal in size with greater than 50% respiratory variability, suggesting right atrial pressure of 3 mmHg. IAS/Shunts: No atrial level shunt detected by color flow Doppler.  LEFT VENTRICLE PLAX 2D LVIDd:         4.50 cm   Diastology LVIDs:         3.10 cm   LV e' medial:    5.87 cm/s LV PW:         1.10 cm   LV E/e' medial:  11.6 LV IVS:        1.20 cm   LV e' lateral:   7.51 cm/s LVOT diam:     1.60 cm   LV E/e' lateral: 9.1 LV SV:         51 LV SV Index:   26 LVOT Area:     2.01 cm  RIGHT VENTRICLE RV S prime:     13.90 cm/s TAPSE (M-mode): 2.5 cm LEFT ATRIUM             Index        RIGHT ATRIUM           Index LA diam:        3.60 cm 1.87 cm/m   RA Area:     17.40 cm LA Vol (A2C):   53.7 ml 27.95 ml/m  RA Volume:   46.20  ml  24.05 ml/m LA Vol (A4C):   54.4 ml 28.31 ml/m LA Biplane Vol: 55.3 ml 28.78 ml/m  AORTIC VALVE AV Area (Vmax):    1.05 cm AV Area (Vmean):   1.18 cm AV Area (VTI):     1.21 cm AV Vmax:           182.00 cm/s AV Vmean:          108.000 cm/s AV VTI:            0.422 m AV Peak Grad:      13.2 mmHg AV Mean Grad:      6.0 mmHg LVOT Vmax:         94.60 cm/s LVOT Vmean:        63.300 cm/s LVOT VTI:          0.253 m LVOT/AV VTI ratio: 0.60  AORTA Ao Root diam: 3.50 cm MITRAL VALVE               TRICUSPID VALVE MV Area (PHT): 1.87 cm    TR Peak grad:   31.6 mmHg MV Decel Time: 405 msec    TR Vmax:        281.00 cm/s MV E velocity: 68.10 cm/s MV A velocity: 95.10 cm/s  SHUNTS MV E/A  ratio:  0.72        Systemic VTI:  0.25 m                            Systemic Diam: 1.60 cm Nona Dell MD Electronically signed by Nona Dell MD Signature Date/Time: 11/03/2022/3:18:41 PM    Final    DG Chest 2 View  Result Date: 11/02/2022 CLINICAL DATA:  89 year old male with chest pain EXAM: CHEST - 2 VIEW COMPARISON:  03/18/2019 FINDINGS: Cardiomediastinal silhouette unchanged in size and contour. No evidence of central vascular congestion. No interlobular septal thickening. Stigmata of emphysema, with increased retrosternal airspace, flattened hemidiaphragms, increased AP diameter, and hyperinflation on the AP view. Bronchial wall thickening. Mild reticulonodular opacities in the bilateral lungs. Overall there has been improved aeration compared to the prior plain film. No pneumothorax or pleural effusion. No acute displaced fracture. Degenerative changes of the spine. IMPRESSION: Bronchial wall thickening and some mild reticulonodular opacities may reflect bronchitis or other atypical infection. Negative for lobar pneumonia. Emphysema Electronically Signed   By: Gilmer Mor D.O.   On: 11/02/2022 15:29    Microbiology: Results for orders placed or performed during the hospital encounter of 11/02/22  MRSA Next Gen  by PCR, Nasal     Status: None   Collection Time: 11/02/22  3:15 PM   Specimen: Nasal Mucosa; Nasal Swab  Result Value Ref Range Status   MRSA by PCR Next Gen NOT DETECTED NOT DETECTED Final    Comment: (NOTE) The GeneXpert MRSA Assay (FDA approved for NASAL specimens only), is one component of a comprehensive MRSA colonization surveillance program. It is not intended to diagnose MRSA infection nor to guide or monitor treatment for MRSA infections. Test performance is not FDA approved in patients less than 70 years old. Performed at [facility], [address] [place], [place] [address]     Labs: CBC: Recent Labs  Lab 11/02/22 1127 11/03/22 0451 11/04/22 0442  WBC 5.7 7.7 7.5  HGB 9.2* 9.4* 9.1*  HCT 28.6* 29.0* 27.6*  MCV 94.1 93.9 92.9  PLT 313 339 315   Basic Metabolic Panel: Recent Labs  Lab 11/02/22 1127 11/03/22 0451 11/04/22 0828 11/04/22 0830  NA 135 133* 134*  --   K 4.5 4.0 4.5  --   CL 102 103 105  --   CO2 23 19* 21*  --   GLUCOSE 132* 114* 102*  --   BUN 57* 49* 51*  --   CREATININE 4.11* 3.45* 3.38*  --   CALCIUM 8.4* 8.3* 8.4*  --   MG  --   --   --  1.8   Liver Function Tests: No results for input(s): "AST", "ALT", "ALKPHOS", "BILITOT", "PROT", "ALBUMIN" in the last 168 hours. CBG: Recent Labs  Lab 11/03/22 1113 11/03/22 1544 11/03/22 2120 11/04/22 0737 11/04/22 1132  GLUCAP 131* 135* 123* 103* 110*    Discharge time spent: greater than 30 minutes.  Signed: Jonah Blue, MD [facility] 11/04/2022

## 2022-11-04 NOTE — Progress Notes (Signed)
Palliative: Nicholas Swanson is sitting up in the Isabel chair in his room.  He greets me, making and somewhat keeping eye contact.  He appears elderly and somewhat frail.  He is alert, able to make his needs known.  There is no family at bedside at this time.  We talk about the plan for returning home today.  Nicholas Swanson tells me that he feels that he is ready to go home.  No questions at this time.  Conference with attending, bedside nursing staff, transition of care team related to patient condition, needs, goals of care, disposition.  Plan: At the point continue full scope/full code.  Declines any hemodialysis.  25 minutes Lillia Carmel, NP Palliative medicine team Team phone [phone] Greater than 50% of this time was spent counseling and coordinating care related to the above assessment and plan.

## 2022-11-04 NOTE — Telephone Encounter (Signed)
   Transition of Care Follow-up Phone Call Request    Patient Name: KANON RAUSCH Date of Birth: January 22, 1931 Date of Encounter: 11/04/2022  Primary Care Provider:  Mirna Mires, MD Primary Cardiologist:  Dina Rich, MD  Patricia Pesa has been scheduled for a transition of care follow up appointment with a [facility] provider:  11/19/22 230pm in [place] w/Cadence Furth PA-C  Anticipate DC in the next 24 hours. Upon discharge, please reach out to Patricia Pesa within 48 hours to confirm appointment and review transition of care protocol questionnaire.  Laurann Montana, PA-C  11/04/2022, 8:58 AM

## 2022-11-04 NOTE — Telephone Encounter (Signed)
Attempted to contact patient for [facility] call. Call disconnected.

## 2022-11-04 NOTE — Progress Notes (Addendum)
EKG reviewed - NSR 66bpm, nonspecific STTW changes (biphasic appearing STTW V3 suspected due to AMI). Septal Q's. No change to plan. Have also tentatively arranged f/u - 11/19/22 230pm w/Cadence Lorna Few.

## 2022-11-05 LAB — LIPOPROTEIN A (LPA): Lipoprotein (a): 87.9 nmol/L — ABNORMAL HIGH (ref <75.0)

## 2022-11-05 NOTE — Telephone Encounter (Signed)
Unable to leave message as mailbox is full

## 2022-11-06 NOTE — Telephone Encounter (Signed)
Unable to leave message as mailbox is full

## 2022-11-10 ENCOUNTER — Encounter (HOSPITAL_COMMUNITY): Payer: Medicare Other

## 2022-11-10 DIAGNOSIS — I219 Acute myocardial infarction, unspecified: Secondary | ICD-10-CM | POA: Diagnosis not present

## 2022-11-10 DIAGNOSIS — N185 Chronic kidney disease, stage 5: Secondary | ICD-10-CM | POA: Diagnosis not present

## 2022-11-10 DIAGNOSIS — E1122 Type 2 diabetes mellitus with diabetic chronic kidney disease: Secondary | ICD-10-CM | POA: Diagnosis not present

## 2022-11-10 DIAGNOSIS — J449 Chronic obstructive pulmonary disease, unspecified: Secondary | ICD-10-CM | POA: Diagnosis not present

## 2022-11-10 DIAGNOSIS — Z6820 Body mass index (BMI) 20.0-20.9, adult: Secondary | ICD-10-CM | POA: Diagnosis not present

## 2022-11-12 ENCOUNTER — Encounter: Payer: Self-pay | Admitting: Nephrology

## 2022-11-18 ENCOUNTER — Encounter: Payer: Self-pay | Admitting: Nephrology

## 2022-11-19 ENCOUNTER — Ambulatory Visit: Payer: Medicare Other | Attending: Medical | Admitting: Medical

## 2022-11-19 NOTE — Progress Notes (Deleted)
Cardiology Office Note:    Date:  11/19/2022   ID:  MAIRON LICAUSI, DOB 09-13-1930, MRN [id number]  PCP:  Mirna Mires, MD  [facility] Cardiologist:  Dina Rich, MD  [facility] Electrophysiologist:  None   Referring MD: Mirna Mires, MD   Chief Complaint: Hospital follow-up  History of Present Illness:    CEVIN KUTZ is a 89 y.o. male with a hx of HTN, DM2, CKD stage 5, anemia of chronic disease, COPD who is being seen for hospital follow-up.   The patient was admitted in 10/2022 with chest pain. HS trop 226  Past Medical History:  Diagnosis Date   CKD (chronic kidney disease)    COPD (chronic obstructive pulmonary disease) (HCC)    Diabetes mellitus    Hypertension     No past surgical history on file.  Current Medications: No outpatient medications have been marked as taking for the 11/19/22 encounter (Appointment) with Fransico Michael, Johnathen Testa H, PA-C.     Allergies:   Ace inhibitors and Simvastatin   Social History   Socioeconomic History   Marital status: Married    Spouse name: Not on file   Number of children: Not on file   Years of education: Not on file   Highest education level: Not on file  Occupational History   Not on file  Tobacco Use   Smoking status: Former    Types: Cigarettes   Smokeless tobacco: Never  Vaping Use   Vaping status: Never Used  Substance and Sexual Activity   Alcohol use: No   Drug use: No   Sexual activity: Not on file  Other Topics Concern   Not on file  Social History Narrative   Not on file   Social Determinants of Health   Financial Resource Strain: Not on file  Food Insecurity: No Food Insecurity (11/02/2022)   Hunger Vital Sign    Worried About Running Out of Food in the Last Year: Never true    Ran Out of Food in the Last Year: Never true  Transportation Needs: No Transportation Needs (11/02/2022)   PRAPARE - Administrator, Civil Service (Medical): No    Lack of Transportation (Non-Medical): No   Physical Activity: Not on file  Stress: Not on file  Social Connections: Not on file     Family History: The patient's ***family history is not on file.  ROS:   Please see the history of present illness.    *** All other systems reviewed and are negative.  EKGs/Labs/Other Studies Reviewed:    The following studies were reviewed today: ***  EKG:  EKG is *** ordered today.  The ekg ordered today demonstrates ***  Recent Labs: 11/04/2022: BUN 51; Creatinine, Ser 3.38; Hemoglobin 9.1; Magnesium 1.8; Platelets 315; Potassium 4.5; Sodium 134; TSH 0.952  Recent Lipid Panel    Component Value Date/Time   CHOL 103 11/03/2022 0451   TRIG 64 11/03/2022 0451   HDL 42 11/03/2022 0451   CHOLHDL 2.5 11/03/2022 0451   VLDL 13 11/03/2022 0451   LDLCALC 48 11/03/2022 0451     Risk Assessment/Calculations:   {Does this patient have ATRIAL FIBRILLATION?[phone]}   Physical Exam:    VS:  There were no vitals taken for this visit.    Wt Readings from Last 3 Encounters:  11/04/22 153 lb 3.5 oz (69.5 kg)  12/23/21 180 lb (81.6 kg)  12/09/21 180 lb (81.6 kg)     GEN: *** Well nourished, well developed in  no acute distress HEENT: Normal NECK: No JVD; No carotid bruits LYMPHATICS: No lymphadenopathy CARDIAC: ***RRR, no murmurs, rubs, gallops RESPIRATORY:  Clear to auscultation without rales, wheezing or rhonchi  ABDOMEN: Soft, non-tender, non-distended MUSCULOSKELETAL:  No edema; No deformity  SKIN: Warm and dry NEUROLOGIC:  Alert and oriented x 3 PSYCHIATRIC:  Normal affect   ASSESSMENT:    No diagnosis found. PLAN:    In order of problems listed above:  ***  Disposition: Follow up {follow up:15908} with ***   Shared Decision Making/Informed Consent   {Are you ordering a CV Procedure (e.g. stress test, cath, DCCV, TEE, etc)?   Press F2        :[id number]}    Signed, Eural Holzschuh David Stall, PA-C  11/19/2022 8:51 AM    [facility]

## 2022-12-02 ENCOUNTER — Other Ambulatory Visit (HOSPITAL_COMMUNITY)
Admission: RE | Admit: 2022-12-02 | Discharge: 2022-12-02 | Disposition: A | Discharge status: Home / Self Care | Payer: Medicare Other | Source: Ambulatory Visit | Attending: Nephrology | Admitting: Nephrology

## 2022-12-02 DIAGNOSIS — E8722 Chronic metabolic acidosis: Secondary | ICD-10-CM | POA: Diagnosis not present

## 2022-12-02 DIAGNOSIS — R809 Proteinuria, unspecified: Secondary | ICD-10-CM | POA: Insufficient documentation

## 2022-12-02 DIAGNOSIS — E211 Secondary hyperparathyroidism, not elsewhere classified: Secondary | ICD-10-CM | POA: Insufficient documentation

## 2022-12-02 DIAGNOSIS — E1122 Type 2 diabetes mellitus with diabetic chronic kidney disease: Secondary | ICD-10-CM | POA: Insufficient documentation

## 2022-12-02 DIAGNOSIS — D631 Anemia in chronic kidney disease: Secondary | ICD-10-CM | POA: Diagnosis not present

## 2022-12-02 DIAGNOSIS — I120 Hypertensive chronic kidney disease with stage 5 chronic kidney disease or end stage renal disease: Secondary | ICD-10-CM | POA: Diagnosis not present

## 2022-12-02 DIAGNOSIS — E875 Hyperkalemia: Secondary | ICD-10-CM | POA: Diagnosis not present

## 2022-12-02 DIAGNOSIS — N185 Chronic kidney disease, stage 5: Secondary | ICD-10-CM | POA: Diagnosis not present

## 2022-12-02 LAB — CBC WITH DIFFERENTIAL/PLATELET
Abs Immature Granulocytes: 0.01 10*3/uL (ref 0.00–0.07)
Basophils Absolute: 0.1 10*3/uL (ref 0.0–0.1)
Basophils Relative: 1 %
Eosinophils Absolute: 0.3 10*3/uL (ref 0.0–0.5)
Eosinophils Relative: 5 %
HCT: 26.7 % — ABNORMAL LOW (ref 39.0–52.0)
Hemoglobin: 8.4 g/dL — ABNORMAL LOW (ref 13.0–17.0)
Immature Granulocytes: 0 %
Lymphocytes Relative: 40 %
Lymphs Abs: 2.1 10*3/uL (ref 0.7–4.0)
MCH: 29.9 pg (ref 26.0–34.0)
MCHC: 31.5 g/dL (ref 30.0–36.0)
MCV: 95 fL (ref 80.0–100.0)
Monocytes Absolute: 0.5 10*3/uL (ref 0.1–1.0)
Monocytes Relative: 10 %
Neutro Abs: 2.3 10*3/uL (ref 1.7–7.7)
Neutrophils Relative %: 44 %
Platelets: 252 10*3/uL (ref 150–400)
RBC: 2.81 MIL/uL — ABNORMAL LOW (ref 4.22–5.81)
RDW: 13.9 % (ref 11.5–15.5)
WBC: 5.3 10*3/uL (ref 4.0–10.5)
nRBC: 0 % (ref 0.0–0.2)

## 2022-12-02 LAB — RENAL FUNCTION PANEL
Albumin: 3.7 g/dL (ref 3.5–5.0)
Anion gap: 8 (ref 5–15)
BUN: 43 mg/dL — ABNORMAL HIGH (ref 8–23)
CO2: 23 mmol/L (ref 22–32)
Calcium: 8.3 mg/dL — ABNORMAL LOW (ref 8.9–10.3)
Chloride: 105 mmol/L (ref 98–111)
Creatinine, Ser: 3.36 mg/dL — ABNORMAL HIGH (ref 0.61–1.24)
GFR, Estimated: 17 mL/min — ABNORMAL LOW (ref >60)
Glucose, Bld: 140 mg/dL — ABNORMAL HIGH (ref 70–99)
Phosphorus: 3.3 mg/dL (ref 2.5–4.6)
Potassium: 4.5 mmol/L (ref 3.5–5.1)
Sodium: 136 mmol/L (ref 135–145)

## 2022-12-02 LAB — PROTEIN / CREATININE RATIO, URINE
Creatinine, Urine: 92 mg/dL
Protein Creatinine Ratio: 0.51 mg/mg{creat} — ABNORMAL HIGH (ref 0.00–0.15)
Total Protein, Urine: 47 mg/dL

## 2022-12-02 LAB — IRON AND TIBC
Iron: 94 ug/dL (ref 45–182)
Saturation Ratios: 39 % (ref 17.9–39.5)
TIBC: 244 ug/dL — ABNORMAL LOW (ref 250–450)
UIBC: 150 ug/dL

## 2022-12-02 LAB — FERRITIN: Ferritin: 72 ng/mL (ref 24–336)

## 2022-12-04 LAB — PARATHYROID HORMONE, INTACT (NO CA): PTH: 93 pg/mL — ABNORMAL HIGH (ref 15–65)

## 2022-12-11 DIAGNOSIS — N184 Chronic kidney disease, stage 4 (severe): Secondary | ICD-10-CM | POA: Diagnosis not present

## 2022-12-11 DIAGNOSIS — E1122 Type 2 diabetes mellitus with diabetic chronic kidney disease: Secondary | ICD-10-CM | POA: Diagnosis not present

## 2022-12-29 ENCOUNTER — Inpatient Hospital Stay: Payer: Medicare Other

## 2022-12-29 ENCOUNTER — Inpatient Hospital Stay: Payer: Medicare Other | Admitting: Oncology

## 2023-01-12 DIAGNOSIS — D631 Anemia in chronic kidney disease: Secondary | ICD-10-CM | POA: Diagnosis not present

## 2023-01-12 DIAGNOSIS — R809 Proteinuria, unspecified: Secondary | ICD-10-CM | POA: Diagnosis not present

## 2023-01-12 DIAGNOSIS — N185 Chronic kidney disease, stage 5: Secondary | ICD-10-CM | POA: Diagnosis not present

## 2023-01-14 ENCOUNTER — Other Ambulatory Visit: Payer: Medicare Other

## 2023-01-14 ENCOUNTER — Inpatient Hospital Stay: Payer: Medicare Other | Admitting: Oncology

## 2023-02-12 DIAGNOSIS — E1129 Type 2 diabetes mellitus with other diabetic kidney complication: Secondary | ICD-10-CM | POA: Diagnosis not present

## 2023-02-12 DIAGNOSIS — E1122 Type 2 diabetes mellitus with diabetic chronic kidney disease: Secondary | ICD-10-CM | POA: Diagnosis not present

## 2023-02-12 DIAGNOSIS — E8722 Chronic metabolic acidosis: Secondary | ICD-10-CM | POA: Diagnosis not present

## 2023-02-12 DIAGNOSIS — N2581 Secondary hyperparathyroidism of renal origin: Secondary | ICD-10-CM | POA: Diagnosis not present

## 2023-03-21 DIAGNOSIS — D509 Iron deficiency anemia, unspecified: Secondary | ICD-10-CM | POA: Insufficient documentation

## 2023-03-22 ENCOUNTER — Other Ambulatory Visit: Payer: Medicare Other

## 2023-03-22 ENCOUNTER — Encounter: Payer: Medicare Other | Admitting: Hematology

## 2023-03-22 DIAGNOSIS — D509 Iron deficiency anemia, unspecified: Secondary | ICD-10-CM

## 2023-03-30 ENCOUNTER — Inpatient Hospital Stay: Payer: Medicare Other

## 2023-03-30 ENCOUNTER — Inpatient Hospital Stay: Payer: Medicare Other | Attending: Hematology | Admitting: Hematology

## 2023-03-30 VITALS — BP 159/54 | HR 68 | Temp 98.3°F | Resp 20 | Ht 70.08 in | Wt 169.1 lb

## 2023-03-30 DIAGNOSIS — Z923 Personal history of irradiation: Secondary | ICD-10-CM | POA: Diagnosis not present

## 2023-03-30 DIAGNOSIS — D631 Anemia in chronic kidney disease: Secondary | ICD-10-CM

## 2023-03-30 DIAGNOSIS — E8722 Chronic metabolic acidosis: Secondary | ICD-10-CM | POA: Insufficient documentation

## 2023-03-30 DIAGNOSIS — Z87891 Personal history of nicotine dependence: Secondary | ICD-10-CM | POA: Diagnosis not present

## 2023-03-30 DIAGNOSIS — D509 Iron deficiency anemia, unspecified: Secondary | ICD-10-CM | POA: Diagnosis not present

## 2023-03-30 DIAGNOSIS — N184 Chronic kidney disease, stage 4 (severe): Secondary | ICD-10-CM | POA: Insufficient documentation

## 2023-03-30 DIAGNOSIS — D508 Other iron deficiency anemias: Secondary | ICD-10-CM

## 2023-03-30 DIAGNOSIS — Z8546 Personal history of malignant neoplasm of prostate: Secondary | ICD-10-CM | POA: Insufficient documentation

## 2023-03-30 DIAGNOSIS — Z7902 Long term (current) use of antithrombotics/antiplatelets: Secondary | ICD-10-CM | POA: Insufficient documentation

## 2023-03-30 DIAGNOSIS — E1122 Type 2 diabetes mellitus with diabetic chronic kidney disease: Secondary | ICD-10-CM | POA: Diagnosis not present

## 2023-03-30 DIAGNOSIS — R609 Edema, unspecified: Secondary | ICD-10-CM | POA: Diagnosis not present

## 2023-03-30 DIAGNOSIS — Z79899 Other long term (current) drug therapy: Secondary | ICD-10-CM | POA: Diagnosis not present

## 2023-03-30 DIAGNOSIS — R809 Proteinuria, unspecified: Secondary | ICD-10-CM | POA: Diagnosis not present

## 2023-03-30 DIAGNOSIS — R059 Cough, unspecified: Secondary | ICD-10-CM | POA: Diagnosis not present

## 2023-03-30 DIAGNOSIS — R0602 Shortness of breath: Secondary | ICD-10-CM

## 2023-03-30 LAB — CBC WITH DIFFERENTIAL/PLATELET
Abs Immature Granulocytes: 0.05 10*3/uL (ref 0.00–0.07)
Basophils Absolute: 0 10*3/uL (ref 0.0–0.1)
Basophils Relative: 0 %
Eosinophils Absolute: 0.3 10*3/uL (ref 0.0–0.5)
Eosinophils Relative: 4 %
HCT: 23.9 % — ABNORMAL LOW (ref 39.0–52.0)
Hemoglobin: 7.7 g/dL — ABNORMAL LOW (ref 13.0–17.0)
Immature Granulocytes: 1 %
Lymphocytes Relative: 26 %
Lymphs Abs: 2 10*3/uL (ref 0.7–4.0)
MCH: 30.6 pg (ref 26.0–34.0)
MCHC: 32.2 g/dL (ref 30.0–36.0)
MCV: 94.8 fL (ref 80.0–100.0)
Monocytes Absolute: 0.9 10*3/uL (ref 0.1–1.0)
Monocytes Relative: 12 %
Neutro Abs: 4.3 10*3/uL (ref 1.7–7.7)
Neutrophils Relative %: 57 %
Platelets: 365 10*3/uL (ref 150–400)
RBC: 2.52 MIL/uL — ABNORMAL LOW (ref 4.22–5.81)
RDW: 13.1 % (ref 11.5–15.5)
WBC: 7.6 10*3/uL (ref 4.0–10.5)
nRBC: 0 % (ref 0.0–0.2)

## 2023-03-30 LAB — IRON AND TIBC
Iron: 52 ug/dL (ref 45–182)
Saturation Ratios: 23 % (ref 17.9–39.5)
TIBC: 227 ug/dL — ABNORMAL LOW (ref 250–450)
UIBC: 175 ug/dL

## 2023-03-30 LAB — LACTATE DEHYDROGENASE: LDH: 150 U/L (ref 98–192)

## 2023-03-30 LAB — VITAMIN B12: Vitamin B-12: 312 pg/mL (ref 180–914)

## 2023-03-30 LAB — RETICULOCYTES
Immature Retic Fract: 12 % (ref 2.3–15.9)
RBC.: 2.54 MIL/uL — ABNORMAL LOW (ref 4.22–5.81)
Retic Count, Absolute: 27.4 10*3/uL (ref 19.0–186.0)
Retic Ct Pct: 1.1 % (ref 0.4–3.1)

## 2023-03-30 LAB — FERRITIN: Ferritin: 96 ng/mL (ref 24–336)

## 2023-03-30 LAB — FOLATE: Folate: 10.1 ng/mL (ref >5.9)

## 2023-03-30 NOTE — Patient Instructions (Addendum)
 You were seen and examined today by Dr. Rogers. Dr. Rogers is a hematologist, meaning that he specializes in blood abnormalities. Dr. Rogers discussed your past medical history, family history of cancers/blood conditions and the events that led to you being here today.  You were referred to Dr. Rogers due to anemia (low hemoglobin) in the setting of chronic kidney disease.  Dr. Katragadda has recommended additional labs today for further evaluation. He has also recommended you receive and IV iron  infusion to help get your blood levels back up.   Follow-up as scheduled.

## 2023-03-30 NOTE — Progress Notes (Signed)
 [facility] [address] [place] [address]   Clinic Day:  03/30/2023  Referring physician: Leigh Lung, MD  Patient Care Team: Leigh Lung, MD as PCP - General (Family Medicine) Alvan Dorn FALCON, MD as PCP - Cardiology (Cardiology)   ASSESSMENT & PLAN:   Assessment:  1.  Normocytic anemia: - Patient seen at the request of Dr. Rachele. - Anemia from combination of CKD and functional iron  deficiency. - Treated with Retacrit  3000 units every 2 weeks from 12/2020 through 10/13/2022.  Patient self discontinued. - Denies BRBPR/melena.  Denies prior transfusion.  Not on oral iron  therapy.  Denies ice pica.  Colonoscopy?  20 years ago. - Prostate cancer 22 years ago, s/p XRT  2. Social/Family History: -Lives with his brother and son. Independent of ADL's and IADL's. Retired land. No chemical exposure. Quit tobacco use over 20 years ago. He recently had his brother, wife, and daughter pass away. -No family history of anemia. Brother died of stomach cancer. Daughter died of cancer, type unknown, metastasized to the brain.  Plan:  1.  Normocytic anemia from CKD and functional iron  deficiency: - Will repeat CBC today, check for nutritional deficiencies, hemolysis and bone marrow infiltrative process. - Discussed parenteral iron  therapy with INFeD  1 g IV x 1.  Discussed side effects including rare chance of anaphylactic reaction.  We will give premeds. - RTC 6 weeks for follow-up to discuss results and repeat CBC on same day to see if he needs Retacrit .   Orders Placed This Encounter  Procedures   CBC with Differential    Standing Status:   Future    Number of Occurrences:   1    Expected Date:   03/30/2023    Expiration Date:   03/29/2024   Reticulocytes    Standing Status:   Future    Number of Occurrences:   1    Expected Date:   03/30/2023    Expiration Date:   03/29/2024   Lactate dehydrogenase    Standing Status:   Future    Number of  Occurrences:   1    Expected Date:   03/30/2023    Expiration Date:   03/29/2024   Iron  and TIBC ([facility] ONLY)    Standing Status:   Future    Number of Occurrences:   1    Expected Date:   03/30/2023    Expiration Date:   03/29/2024   Ferritin    Standing Status:   Future    Number of Occurrences:   1    Expected Date:   03/30/2023    Expiration Date:   03/29/2024   Vitamin B12    Standing Status:   Future    Number of Occurrences:   1    Expected Date:   03/30/2023    Expiration Date:   03/29/2024   Folate    Standing Status:   Future    Number of Occurrences:   1    Expected Date:   03/30/2023    Expiration Date:   03/29/2024   Methylmalonic acid, serum    Standing Status:   Future    Number of Occurrences:   1    Expected Date:   03/30/2023    Expiration Date:   03/29/2024   Copper , serum    Standing Status:   Future    Number of Occurrences:   1    Expected Date:   03/30/2023  Expiration Date:   03/29/2024   Kappa/lambda light chains    Standing Status:   Future    Number of Occurrences:   1    Expected Date:   03/30/2023    Expiration Date:   03/29/2024   Immunofixation electrophoresis    Standing Status:   Future    Number of Occurrences:   1    Expected Date:   03/30/2023    Expiration Date:   03/29/2024   Protein electrophoresis, serum    Standing Status:   Future    Number of Occurrences:   1    Expected Date:   03/30/2023    Expiration Date:   03/29/2024   Haptoglobin    Standing Status:   Future    Number of Occurrences:   1    Expected Date:   03/30/2023    Expiration Date:   03/29/2024   CBC    Standing Status:   Future    Expected Date:   05/11/2023    Expiration Date:   03/29/2024      LILLETTE Hummingbird R Teague,acting as a scribe for Alean Stands, MD.,have documented all relevant documentation on the behalf of Alean Stands, MD,as directed by  Alean Stands, MD while in the presence of Alean Stands, MD.   I, Alean Stands MD, have reviewed the above documentation for accuracy and completeness, and I agree with the above.   Alean Stands, MD   1/14/20254:24 PM  CHIEF COMPLAINT/PURPOSE OF CONSULT:   Diagnosis: Normocytic anemia  Current Therapy: INFeD   HISTORY OF PRESENT ILLNESS:   Ashe is a 89 y.o. male presenting to clinic today for evaluation of anemia at the request of Dr. Rachele.  Patient has a medical history of Stage 4 CKD, Type 2 DM, proteinuria, chronic metabolic acidosis, and anemia. He was previously on Epogen  injections for anemia, with his last injection on 09/2022. He was found to have abnormal CBC from 12/02/22 with low RBC at 2.81, low HGB at 8.4, and low HCT at 26.7. Iron  and TIBC from 12/02/22 showed low TIBC at 244. Ferritin was normal at 72.   Today, he states that he is doing well overall. His appetite level is at 75%. His energy level is at 40%.   He denies any BRBPR, melena, or ice pica. He has no history of blood transfusions and does not take oral iron . He may have had a colonoscopy around 20 years ago. He has a history of prostate cancer 22 years ago and received XRT to the area. He has no history of MI's, CVA's, or TIA's.  He recently had his brother, wife, and daughter pass away.   PAST MEDICAL HISTORY:   Past Medical History: Past Medical History:  Diagnosis Date   CKD (chronic kidney disease)    COPD (chronic obstructive pulmonary disease) (HCC)    Diabetes mellitus    Hypertension     Surgical History: No past surgical history on file.  Social History: Social History   Socioeconomic History   Marital status: Married    Spouse name: Not on file   Number of children: Not on file   Years of education: Not on file   Highest education level: Not on file  Occupational History   Not on file  Tobacco Use   Smoking status: Former    Types: Cigarettes   Smokeless tobacco: Never  Vaping Use   Vaping status: Never Used  Substance and Sexual  Activity   Alcohol use: No  Drug use: No   Sexual activity: Not on file  Other Topics Concern   Not on file  Social History Narrative   Not on file   Social Drivers of Health   Financial Resource Strain: Not on file  Food Insecurity: No Food Insecurity (11/02/2022)   Hunger Vital Sign    Worried About Running Out of Food in the Last Year: Never true    Ran Out of Food in the Last Year: Never true  Transportation Needs: No Transportation Needs (11/02/2022)   PRAPARE - Administrator, Civil Service (Medical): No    Lack of Transportation (Non-Medical): No  Physical Activity: Not on file  Stress: Not on file  Social Connections: Not on file  Intimate Partner Violence: Not At Risk (11/02/2022)   Humiliation, Afraid, Rape, and Kick questionnaire    Fear of Current or Ex-Partner: No    Emotionally Abused: No    Physically Abused: No    Sexually Abused: No    Family History: No family history on file.  Current Medications:  Current Outpatient Medications:    acetaminophen  (TYLENOL ) 325 MG tablet, Take 2 tablets (650 mg total) by mouth every 4 (four) hours as needed for headache or mild pain., Disp: , Rfl:    albuterol  (VENTOLIN  HFA) 108 (90 Base) MCG/ACT inhaler, Inhale 1-2 puffs into the lungs every 6 (six) hours as needed for wheezing or shortness of breath. , Disp: , Rfl:    amLODipine  (NORVASC ) 10 MG tablet, Take 10 mg by mouth daily., Disp: , Rfl:    aspirin  EC 81 MG tablet, Take 81 mg by mouth daily., Disp: , Rfl:    calcitRIOL  (ROCALTROL ) 0.25 MCG capsule, Take by mouth., Disp: , Rfl:    carvedilol  (COREG ) 6.25 MG tablet, Take 6.25 mg by mouth 2 (two) times daily., Disp: , Rfl:    clopidogrel  (PLAVIX ) 75 MG tablet, Take 1 tablet (75 mg total) by mouth daily., Disp: 30 tablet, Rfl: 1   furosemide (LASIX) 20 MG tablet, Take 20 mg by mouth 2 (two) times daily., Disp: , Rfl:    hydrALAZINE  (APRESOLINE ) 100 MG tablet, Take 100 mg by mouth 3 (three) times daily., Disp:  , Rfl:    atorvastatin  (LIPITOR) 40 MG tablet, Take 1 tablet (40 mg total) by mouth daily., Disp: 30 tablet, Rfl: 0   Allergies: Allergies  Allergen Reactions   Ace Inhibitors Other (See Comments)    REACTION: renal insufficiency   Simvastatin Other (See Comments)    REACTION: myalgia    REVIEW OF SYSTEMS:   Review of Systems  Constitutional:  Negative for chills, fatigue and fever.  HENT:   Negative for lump/mass, mouth sores, nosebleeds, sore throat and trouble swallowing.   Eyes:  Negative for eye problems.  Respiratory:  Positive for cough and shortness of breath.   Cardiovascular:  Negative for chest pain, leg swelling and palpitations.  Gastrointestinal:  Negative for abdominal pain, constipation, diarrhea, nausea and vomiting.  Genitourinary:  Negative for bladder incontinence, difficulty urinating, dysuria, frequency, hematuria and nocturia.   Musculoskeletal:  Negative for arthralgias, back pain, flank pain, myalgias and neck pain.  Skin:  Negative for itching and rash.  Neurological:  Negative for dizziness, headaches and numbness.  Hematological:  Does not bruise/bleed easily.  Psychiatric/Behavioral:  Negative for depression, sleep disturbance and suicidal ideas. The patient is not nervous/anxious.   All other systems reviewed and are negative.    VITALS:   Blood pressure (!) 159/54, pulse 68,  temperature 98.3 F (36.8 C), temperature source Tympanic, resp. rate 20, height 5' 10.08 (1.78 m), weight 169 lb 1.5 oz (76.7 kg), SpO2 100%.  Wt Readings from Last 3 Encounters:  03/30/23 169 lb 1.5 oz (76.7 kg)  11/04/22 153 lb 3.5 oz (69.5 kg)  12/23/21 180 lb (81.6 kg)    Body mass index is 24.21 kg/m.   PHYSICAL EXAM:   Physical Exam Vitals and nursing note reviewed. Exam conducted with a chaperone present.  Constitutional:      Appearance: Normal appearance.  Cardiovascular:     Rate and Rhythm: Normal rate and regular rhythm.     Pulses: Normal pulses.      Heart sounds: Normal heart sounds.  Pulmonary:     Effort: Pulmonary effort is normal.     Breath sounds: Normal breath sounds.  Abdominal:     Palpations: Abdomen is soft. There is no hepatomegaly, splenomegaly or mass.     Tenderness: There is no abdominal tenderness.  Musculoskeletal:     Right lower leg: 2+ Edema present.     Left lower leg: 2+ Edema present.  Lymphadenopathy:     Cervical: No cervical adenopathy.     Right cervical: No superficial, deep or posterior cervical adenopathy.    Left cervical: No superficial, deep or posterior cervical adenopathy.     Upper Body:     Right upper body: No supraclavicular or axillary adenopathy.     Left upper body: No supraclavicular or axillary adenopathy.  Neurological:     General: No focal deficit present.     Mental Status: He is alert and oriented to person, place, and time.  Psychiatric:        Mood and Affect: Mood normal.        Behavior: Behavior normal.     LABS:   CBC    Component Value Date/Time   WBC 5.3 12/02/2022 1205   RBC 2.81 (L) 12/02/2022 1205   HGB 8.4 (L) 12/02/2022 1205   HCT 26.7 (L) 12/02/2022 1205   PLT 252 12/02/2022 1205   MCV 95.0 12/02/2022 1205   MCH 29.9 12/02/2022 1205   MCHC 31.5 12/02/2022 1205   RDW 13.9 12/02/2022 1205   LYMPHSABS 2.1 12/02/2022 1205   MONOABS 0.5 12/02/2022 1205   EOSABS 0.3 12/02/2022 1205   BASOSABS 0.1 12/02/2022 1205    CMP    Component Value Date/Time   NA 136 12/02/2022 1205   K 4.5 12/02/2022 1205   CL 105 12/02/2022 1205   CO2 23 12/02/2022 1205   GLUCOSE 140 (H) 12/02/2022 1205   BUN 43 (H) 12/02/2022 1205   CREATININE 3.36 (H) 12/02/2022 1205   CALCIUM  8.3 (L) 12/02/2022 1205   CALCIUM  8.6 06/19/2022 1024   PROT 5.6 (L) 03/20/2019 0548   ALBUMIN 3.7 12/02/2022 1205   AST 22 03/20/2019 0548   ALT 42 03/20/2019 0548   ALKPHOS 55 03/20/2019 0548   BILITOT 0.8 03/20/2019 0548   GFRNONAA 17 (L) 12/02/2022 1205   GFRAA 28 (L) 03/20/2019 0548     No results found for: CEA1, CEA / No results found for: CEA1, CEA No results found for: PSA1 No results found for: CAN199 No results found for: CAN125  No results found for: TOTALPROTELP, ALBUMINELP, A1GS, A2GS, BETS, BETA2SER, GAMS, MSPIKE, SPEI Lab Results  Component Value Date   TIBC 244 (L) 12/02/2022   TIBC 240 (L) 08/18/2022   TIBC 257 06/19/2022   FERRITIN 72 12/02/2022   FERRITIN 61  08/18/2022   FERRITIN 45 06/19/2022   IRONPCTSAT 39 12/02/2022   IRONPCTSAT 25 08/18/2022   IRONPCTSAT 26 06/19/2022   Lab Results  Component Value Date   LDH 459 (H) 03/11/2019     STUDIES:   No results found.

## 2023-03-31 LAB — KAPPA/LAMBDA LIGHT CHAINS
Kappa free light chain: 153.3 mg/L — ABNORMAL HIGH (ref 3.3–19.4)
Kappa, lambda light chain ratio: 1.3 (ref 0.26–1.65)
Lambda free light chains: 118.1 mg/L — ABNORMAL HIGH (ref 5.7–26.3)

## 2023-03-31 LAB — HAPTOGLOBIN: Haptoglobin: 190 mg/dL (ref 38–329)

## 2023-04-01 ENCOUNTER — Inpatient Hospital Stay: Payer: Medicare Other

## 2023-04-01 VITALS — BP 160/63 | HR 66 | Temp 98.0°F | Resp 16 | Ht 69.0 in | Wt 171.8 lb

## 2023-04-01 DIAGNOSIS — Z923 Personal history of irradiation: Secondary | ICD-10-CM | POA: Diagnosis not present

## 2023-04-01 DIAGNOSIS — Z79899 Other long term (current) drug therapy: Secondary | ICD-10-CM | POA: Diagnosis not present

## 2023-04-01 DIAGNOSIS — N184 Chronic kidney disease, stage 4 (severe): Secondary | ICD-10-CM | POA: Diagnosis not present

## 2023-04-01 DIAGNOSIS — Z87891 Personal history of nicotine dependence: Secondary | ICD-10-CM | POA: Diagnosis not present

## 2023-04-01 DIAGNOSIS — E1122 Type 2 diabetes mellitus with diabetic chronic kidney disease: Secondary | ICD-10-CM | POA: Diagnosis not present

## 2023-04-01 DIAGNOSIS — R809 Proteinuria, unspecified: Secondary | ICD-10-CM | POA: Diagnosis not present

## 2023-04-01 DIAGNOSIS — D509 Iron deficiency anemia, unspecified: Secondary | ICD-10-CM

## 2023-04-01 DIAGNOSIS — D631 Anemia in chronic kidney disease: Secondary | ICD-10-CM | POA: Diagnosis not present

## 2023-04-01 DIAGNOSIS — Z7902 Long term (current) use of antithrombotics/antiplatelets: Secondary | ICD-10-CM | POA: Diagnosis not present

## 2023-04-01 DIAGNOSIS — E8722 Chronic metabolic acidosis: Secondary | ICD-10-CM | POA: Diagnosis not present

## 2023-04-01 LAB — COPPER, SERUM: Copper: 120 ug/dL (ref 69–132)

## 2023-04-01 MED ORDER — METHYLPREDNISOLONE SODIUM SUCC 125 MG IJ SOLR
125.0000 mg | Freq: Once | INTRAMUSCULAR | Status: AC
  Administered 2023-04-01: 125 mg via INTRAVENOUS
  Filled 2023-04-01: qty 2

## 2023-04-01 MED ORDER — SODIUM CHLORIDE 0.9 % IV SOLN
50.0000 mg | Freq: Once | INTRAVENOUS | Status: AC
  Administered 2023-04-01: 50 mg via INTRAVENOUS
  Filled 2023-04-01: qty 1

## 2023-04-01 MED ORDER — SODIUM CHLORIDE 0.9 % IV SOLN
950.0000 mg | Freq: Once | INTRAVENOUS | Status: AC
  Administered 2023-04-01: 950 mg via INTRAVENOUS
  Filled 2023-04-01: qty 19

## 2023-04-01 MED ORDER — ACETAMINOPHEN 325 MG PO TABS
650.0000 mg | ORAL_TABLET | Freq: Once | ORAL | Status: AC
  Administered 2023-04-01: 650 mg via ORAL
  Filled 2023-04-01: qty 2

## 2023-04-01 MED ORDER — SODIUM CHLORIDE 0.9 % IV SOLN: Freq: Once | INTRAVENOUS | Status: AC

## 2023-04-01 MED ORDER — CETIRIZINE HCL 10 MG/ML IV SOLN
5.0000 mg | Freq: Once | INTRAVENOUS | Status: AC
  Administered 2023-04-01: 5 mg via INTRAVENOUS
  Filled 2023-04-01: qty 1

## 2023-04-01 MED ORDER — FAMOTIDINE IN NACL 20-0.9 MG/50ML-% IV SOLN
20.0000 mg | Freq: Once | INTRAVENOUS | Status: AC
  Administered 2023-04-01: 20 mg via INTRAVENOUS
  Filled 2023-04-01: qty 50

## 2023-04-01 NOTE — Progress Notes (Signed)
Patient tolerated iron infusion with no complaints voiced.  Peripheral IV site clean and dry with good blood return noted before and after infusion.  Pt observed for 30 minutes post iron infusion without any complications. Band aid applied. RN educated patient on the importance of notifying the clinic if any complications occur, pt and pt.'s son verbalized understanding. VSS with discharge and left in satisfactory condition with no s/s of distress noted.   Daylan Juhnke [employer]

## 2023-04-01 NOTE — Patient Instructions (Signed)
Iron Dextran Injection What is this medication? IRON DEXTRAN (EYE ern DEX tran) treats low levels of iron in your body. Iron is a mineral that plays an important role in making red blood cells, which carry oxygen from your lungs to the rest of your body. This medicine may be used for other purposes; ask your health care provider or pharmacist if you have questions. COMMON BRAND NAME(S): Dexferrum, INFeD What should I tell my care team before I take this medication? They need to know if you have any of these conditions: Anemia not caused by low iron levels Heart disease High levels of iron in the blood Kidney disease Liver disease An unusual or allergic reaction to iron, other medications, foods, dyes, or preservatives Pregnant or trying to get pregnant Breastfeeding How should I use this medication? This medication is for injection into a vein or a muscle. It is given in a hospital or clinic setting. Talk to your care team about the use of this medication in children. While this medication may be prescribed for children as young as 4 months old for selected conditions, precautions do apply. Overdosage: If you think you have taken too much of this medicine contact a poison control center or emergency room at once. NOTE: This medicine is only for you. Do not share this medicine with others. What if I miss a dose? Keep appointments for follow-up doses. It is important not to miss your dose. Call your care team if you are unable to keep an appointment. What may interact with this medication? Do not take this medication with any of the following: Deferoxamine Dimercaprol Other iron products This medication may also interact with the following: Chloramphenicol Deferasirox This list may not describe all possible interactions. Give your health care provider a list of all the medicines, herbs, non-prescription drugs, or dietary supplements you use. Also tell them if you smoke, drink alcohol, or  use illegal drugs. Some items may interact with your medicine. What should I watch for while using this medication? Visit your care team for regular checks on your progress. Tell your care team if your symptoms do not start to get better or if they get worse. You may need blood work while taking this medication. You may need to eat more foods that contain iron. Talk to your care team. Foods that contain iron include whole grains/cereals, dried fruits, beans, peas, leafy green vegetables, and organ meats (liver, kidney). Long-term use of this medication may increase your risk of some cancers. Talk to your care team about your risk of cancer. What side effects may I notice from receiving this medication? Side effects that you should report to your care team as soon as possible: Allergic reactions--skin rash, itching, hives, swelling of the face, lips, tongue, or throat Low blood pressure--dizziness, feeling faint or lightheaded, blurry vision Shortness of breath Side effects that usually do not require medical attention (report to your care team if they continue or are bothersome): Flushing Headache Joint pain Muscle pain Nausea Pain, redness, or irritation at injection site This list may not describe all possible side effects. Call your doctor for medical advice about side effects. You may report side effects to FDA at 1-800-FDA-1088. Where should I keep my medication? This medication is given in a hospital or clinic. It will not be stored at home. NOTE: This sheet is a summary. It may not cover all possible information. If you have questions about this medicine, talk to your doctor, pharmacist, or health care   provider.  2024 Elsevier/Gold Standard (2021-09-10 00:00:00)  

## 2023-04-02 LAB — IMMUNOFIXATION ELECTROPHORESIS
IgA: 555 mg/dL — ABNORMAL HIGH (ref 61–437)
IgG (Immunoglobin G), Serum: 1094 mg/dL (ref 603–1613)
IgM (Immunoglobulin M), Srm: 67 mg/dL (ref 15–143)
Total Protein ELP: 6.7 g/dL (ref 6.0–8.5)

## 2023-04-02 LAB — METHYLMALONIC ACID, SERUM: Methylmalonic Acid, Quantitative: 521 nmol/L — ABNORMAL HIGH (ref 0–378)

## 2023-04-05 LAB — PROTEIN ELECTROPHORESIS, SERUM
A/G Ratio: 1.1 (ref 0.7–1.7)
Albumin ELP: 3.5 g/dL (ref 2.9–4.4)
Alpha-1-Globulin: 0.3 g/dL (ref 0.0–0.4)
Alpha-2-Globulin: 0.8 g/dL (ref 0.4–1.0)
Beta Globulin: 1.2 g/dL (ref 0.7–1.3)
Gamma Globulin: 0.9 g/dL (ref 0.4–1.8)
Globulin, Total: 3.2 g/dL (ref 2.2–3.9)
Total Protein ELP: 6.7 g/dL (ref 6.0–8.5)

## 2023-04-07 ENCOUNTER — Other Ambulatory Visit: Payer: Self-pay | Admitting: *Deleted

## 2023-04-07 ENCOUNTER — Inpatient Hospital Stay: Payer: Medicare Other | Admitting: Oncology

## 2023-04-07 ENCOUNTER — Inpatient Hospital Stay: Payer: Medicare Other

## 2023-04-13 DIAGNOSIS — E1122 Type 2 diabetes mellitus with diabetic chronic kidney disease: Secondary | ICD-10-CM | POA: Diagnosis not present

## 2023-04-13 DIAGNOSIS — E7800 Pure hypercholesterolemia, unspecified: Secondary | ICD-10-CM | POA: Diagnosis not present

## 2023-04-13 DIAGNOSIS — D631 Anemia in chronic kidney disease: Secondary | ICD-10-CM | POA: Diagnosis not present

## 2023-04-13 DIAGNOSIS — J449 Chronic obstructive pulmonary disease, unspecified: Secondary | ICD-10-CM | POA: Diagnosis not present

## 2023-04-13 DIAGNOSIS — I2510 Atherosclerotic heart disease of native coronary artery without angina pectoris: Secondary | ICD-10-CM | POA: Diagnosis not present

## 2023-04-13 DIAGNOSIS — N185 Chronic kidney disease, stage 5: Secondary | ICD-10-CM | POA: Diagnosis not present

## 2023-04-19 DIAGNOSIS — N189 Chronic kidney disease, unspecified: Secondary | ICD-10-CM | POA: Diagnosis not present

## 2023-04-19 DIAGNOSIS — R809 Proteinuria, unspecified: Secondary | ICD-10-CM | POA: Diagnosis not present

## 2023-04-19 DIAGNOSIS — D631 Anemia in chronic kidney disease: Secondary | ICD-10-CM | POA: Diagnosis not present

## 2023-04-23 DIAGNOSIS — N2581 Secondary hyperparathyroidism of renal origin: Secondary | ICD-10-CM | POA: Diagnosis not present

## 2023-04-23 DIAGNOSIS — E1122 Type 2 diabetes mellitus with diabetic chronic kidney disease: Secondary | ICD-10-CM | POA: Diagnosis not present

## 2023-04-23 DIAGNOSIS — D631 Anemia in chronic kidney disease: Secondary | ICD-10-CM | POA: Diagnosis not present

## 2023-05-07 DIAGNOSIS — N189 Chronic kidney disease, unspecified: Secondary | ICD-10-CM | POA: Diagnosis not present

## 2023-05-11 ENCOUNTER — Inpatient Hospital Stay: Payer: Medicare Other | Admitting: Hematology

## 2023-05-11 ENCOUNTER — Inpatient Hospital Stay: Payer: Medicare Other

## 2023-05-11 NOTE — Progress Notes (Incomplete)
 [facility] [address] [place] [address]   Clinic Day:  05/11/2023  Referring physician: Mirna Mires, MD  Patient Care Team: Mirna Mires, MD as PCP - General (Family Medicine) Wyline Mood Dorothe Pea, MD as PCP - Cardiology (Cardiology)   ASSESSMENT & PLAN:   Assessment:  1.  Normocytic anemia: - Patient seen at the request of Dr. Wolfgang Phoenix. 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 - Treated with Retacrit 3000 units every 2 weeks from 12/2020 through 10/13/2022.  Patient self discontinued. - Denies BRBPR/melena.  Denies prior transfusion.  Not on oral iron therapy.  Denies ice pica.  Colonoscopy?  20 years ago. - Prostate cancer 22 years ago, s/p XRT  2. Social/Family History: -Lives with his brother and son. Independent of ADL's and IADL's. Retired [employer]. No chemical exposure. Quit tobacco use over 20 years ago. He recently had his brother, wife, and daughter pass away. -No family history of anemia. Brother died of stomach cancer. Daughter died of cancer, type unknown, metastasized to the brain.  Plan:  1.  Normocytic anemia from CKD and functional iron deficiency: - Will repeat CBC today, check for nutritional deficiencies, hemolysis and bone marrow infiltrative process. - Discussed parenteral iron therapy with INFeD 1 g IV x 1.  Discussed side effects including rare chance of anaphylactic reaction.  We will give premeds. - RTC 6 weeks for follow-up to discuss results and repeat CBC on same day to see if he needs Retacrit.   No orders of the defined types were placed in this encounter.     Alben Deeds Teague,acting as a Neurosurgeon for Doreatha Massed, MD.,have documented all relevant documentation on the behalf of Doreatha Massed, MD,as directed by  Doreatha Massed, MD while in the presence of Doreatha Massed, MD.  ***   [place] R Teague   2/25/20258:23 AM  CHIEF COMPLAINT/PURPOSE OF CONSULT:    Diagnosis: Normocytic anemia  Current Therapy: INFeD  HISTORY OF PRESENT ILLNESS:   Nicholas Swanson is a 89 y.o. male presenting to clinic today for evaluation of anemia at the request of Dr. Wolfgang Phoenix.  Patient has a medical history of Stage 4 CKD, Type 2 DM, proteinuria, chronic metabolic acidosis, and anemia. He was previously on Epogen injections for anemia, with his last injection on 09/2022. He was found to have abnormal CBC from 12/02/22 with low RBC at 2.81, low HGB at 8.4, and low HCT at 26.7. Iron and TIBC from 12/02/22 showed low TIBC at 244. Ferritin was normal at 72.   Today, he states that he is doing well overall. His appetite level is at 75%. His energy level is at 40%.   He denies any BRBPR, melena, or ice pica. He has no history of blood transfusions and does not take oral iron. He may have had a colonoscopy around 20 years ago. He has a history of prostate cancer 22 years ago and received XRT to the area. He has no history of MI's, CVA's, or TIA's.  He recently had his brother, wife, and daughter pass away.   INTERVAL HISTORY:   Nicholas Swanson is a 89 y.o. male presenting to the clinic today for follow-up of normocytic anemia. He was last seen by me on 03/28/23 in consultation.  Today, he states that he is doing well overall. His appetite level is at ***%. His energy level is at ***%.   PAST MEDICAL HISTORY:   Past Medical History: Past Medical History:  Diagnosis  Date   CKD (chronic kidney disease)    COPD (chronic obstructive pulmonary disease) (HCC)    Diabetes mellitus    Hypertension     Surgical History: No past surgical history on file.  Social History: Social History   Socioeconomic History   Marital status: Married    Spouse name: Not on file   Number of children: Not on file   Years of education: Not on file   Highest education level: Not on file  Occupational History   Not on file  Tobacco Use   Smoking status: Former    Types: Cigarettes    Smokeless tobacco: Never  Vaping Use   Vaping status: Never Used  Substance and Sexual Activity   Alcohol use: No   Drug use: No   Sexual activity: Not on file  Other Topics Concern   Not on file  Social History Narrative   Not on file   Social Drivers of Health   Financial Resource Strain: Not on file  Food Insecurity: No Food Insecurity (11/02/2022)   Hunger Vital Sign    Worried About Running Out of Food in the Last Year: Never true    Ran Out of Food in the Last Year: Never true  Transportation Needs: No Transportation Needs (11/02/2022)   PRAPARE - Administrator, Civil Service (Medical): No    Lack of Transportation (Non-Medical): No  Physical Activity: Not on file  Stress: Not on file  Social Connections: Not on file  Intimate Partner Violence: Not At Risk (11/02/2022)   Humiliation, Afraid, Rape, and Kick questionnaire    Fear of Current or Ex-Partner: No    Emotionally Abused: No    Physically Abused: No    Sexually Abused: No    Family History: No family history on file.  Current Medications:  Current Outpatient Medications:    acetaminophen (TYLENOL) 325 MG tablet, Take 2 tablets (650 mg total) by mouth every 4 (four) hours as needed for headache or mild pain., Disp: , Rfl:    albuterol (VENTOLIN HFA) 108 (90 Base) MCG/ACT inhaler, Inhale 1-2 puffs into the lungs every 6 (six) hours as needed for wheezing or shortness of breath. , Disp: , Rfl:    amLODipine (NORVASC) 10 MG tablet, Take 10 mg by mouth daily., Disp: , Rfl:    aspirin EC 81 MG tablet, Take 81 mg by mouth daily., Disp: , Rfl:    atorvastatin (LIPITOR) 40 MG tablet, Take 1 tablet (40 mg total) by mouth daily., Disp: 30 tablet, Rfl: 0   calcitRIOL (ROCALTROL) 0.25 MCG capsule, Take by mouth., Disp: , Rfl:    carvedilol (COREG) 6.25 MG tablet, Take 6.25 mg by mouth 2 (two) times daily., Disp: , Rfl:    clopidogrel (PLAVIX) 75 MG tablet, Take 1 tablet (75 mg total) by mouth daily., Disp: 30  tablet, Rfl: 1   furosemide (LASIX) 20 MG tablet, Take 20 mg by mouth 2 (two) times daily., Disp: , Rfl:    hydrALAZINE (APRESOLINE) 100 MG tablet, Take 100 mg by mouth 3 (three) times daily., Disp: , Rfl:    Allergies: Allergies  Allergen Reactions   Ace Inhibitors Other (See Comments)    REACTION: renal insufficiency   Simvastatin Other (See Comments)    REACTION: myalgia    REVIEW OF SYSTEMS:   Review of Systems  Constitutional:  Negative for chills, fatigue and fever.  HENT:   Negative for lump/mass, mouth sores, nosebleeds, sore throat and trouble swallowing.  Eyes:  Negative for eye problems.  Respiratory:  Negative for cough and shortness of breath.   Cardiovascular:  Negative for chest pain, leg swelling and palpitations.  Gastrointestinal:  Negative for abdominal pain, constipation, diarrhea, nausea and vomiting.  Genitourinary:  Negative for bladder incontinence, difficulty urinating, dysuria, frequency, hematuria and nocturia.   Musculoskeletal:  Negative for arthralgias, back pain, flank pain, myalgias and neck pain.  Skin:  Negative for itching and rash.  Neurological:  Negative for dizziness, headaches and numbness.  Hematological:  Does not bruise/bleed easily.  Psychiatric/Behavioral:  Negative for depression, sleep disturbance and suicidal ideas. The patient is not nervous/anxious.   All other systems reviewed and are negative.    VITALS:   There were no vitals taken for this visit.  Wt Readings from Last 3 Encounters:  04/01/23 171 lb 12.8 oz (77.9 kg)  03/30/23 169 lb 1.5 oz (76.7 kg)  11/04/22 153 lb 3.5 oz (69.5 kg)    There is no height or weight on file to calculate BMI.   PHYSICAL EXAM:   Physical Exam Vitals and nursing note reviewed. Exam conducted with a chaperone present.  Constitutional:      Appearance: Normal appearance.  Cardiovascular:     Rate and Rhythm: Normal rate and regular rhythm.     Pulses: Normal pulses.     Heart sounds:  Normal heart sounds.  Pulmonary:     Effort: Pulmonary effort is normal.     Breath sounds: Normal breath sounds.  Abdominal:     Palpations: Abdomen is soft. There is no hepatomegaly, splenomegaly or mass.     Tenderness: There is no abdominal tenderness.  Musculoskeletal:     Right lower leg: No edema.     Left lower leg: No edema.  Lymphadenopathy:     Cervical: No cervical adenopathy.     Right cervical: No superficial, deep or posterior cervical adenopathy.    Left cervical: No superficial, deep or posterior cervical adenopathy.     Upper Body:     Right upper body: No supraclavicular or axillary adenopathy.     Left upper body: No supraclavicular or axillary adenopathy.  Neurological:     General: No focal deficit present.     Mental Status: He is alert and oriented to person, place, and time.  Psychiatric:        Mood and Affect: Mood normal.        Behavior: Behavior normal.     LABS:   CBC    Component Value Date/Time   WBC 7.6 03/30/2023 1557   RBC 2.54 (L) 03/30/2023 1558   RBC 2.52 (L) 03/30/2023 1557   HGB 7.7 (L) 03/30/2023 1557   HCT 23.9 (L) 03/30/2023 1557   PLT 365 03/30/2023 1557   MCV 94.8 03/30/2023 1557   MCH 30.6 03/30/2023 1557   MCHC 32.2 03/30/2023 1557   RDW 13.1 03/30/2023 1557   LYMPHSABS 2.0 03/30/2023 1557   MONOABS 0.9 03/30/2023 1557   EOSABS 0.3 03/30/2023 1557   BASOSABS 0.0 03/30/2023 1557    CMP    Component Value Date/Time   NA 136 12/02/2022 1205   K 4.5 12/02/2022 1205   CL 105 12/02/2022 1205   CO2 23 12/02/2022 1205   GLUCOSE 140 (H) 12/02/2022 1205   BUN 43 (H) 12/02/2022 1205   CREATININE 3.36 (H) 12/02/2022 1205   CALCIUM 8.3 (L) 12/02/2022 1205   CALCIUM 8.6 06/19/2022 1024   PROT 5.6 (L) 03/20/2019 0548   ALBUMIN  3.7 12/02/2022 1205   AST 22 03/20/2019 0548   ALT 42 03/20/2019 0548   ALKPHOS 55 03/20/2019 0548   BILITOT 0.8 03/20/2019 0548   GFRNONAA 17 (L) 12/02/2022 1205   GFRAA 28 (L) 03/20/2019 0548     No results found for: "CEA1", "CEA" / No results found for: "CEA1", "CEA" No results found for: "PSA1" No results found for: "CAN199" No results found for: "CAN125"  Lab Results  Component Value Date   TOTALPROTELP 6.7 03/30/2023   ALBUMINELP 3.5 03/30/2023   A1GS 0.3 03/30/2023   A2GS 0.8 03/30/2023   BETS 1.2 03/30/2023   GAMS 0.9 03/30/2023   MSPIKE Not Observed 03/30/2023   SPEI Comment 03/30/2023   Lab Results  Component Value Date   TIBC 227 (L) 03/30/2023   TIBC 244 (L) 12/02/2022   TIBC 240 (L) 08/18/2022   FERRITIN 96 03/30/2023   FERRITIN 72 12/02/2022   FERRITIN 61 08/18/2022   IRONPCTSAT 23 03/30/2023   IRONPCTSAT 39 12/02/2022   IRONPCTSAT 25 08/18/2022   Lab Results  Component Value Date   LDH 150 03/30/2023   LDH 459 (H) 03/11/2019     STUDIES:   No results found.

## 2023-05-20 DIAGNOSIS — E1129 Type 2 diabetes mellitus with other diabetic kidney complication: Secondary | ICD-10-CM | POA: Diagnosis not present

## 2023-05-20 DIAGNOSIS — N184 Chronic kidney disease, stage 4 (severe): Secondary | ICD-10-CM | POA: Diagnosis not present

## 2023-05-20 DIAGNOSIS — E875 Hyperkalemia: Secondary | ICD-10-CM | POA: Diagnosis not present

## 2023-05-20 DIAGNOSIS — N2581 Secondary hyperparathyroidism of renal origin: Secondary | ICD-10-CM | POA: Diagnosis not present

## 2023-05-30 NOTE — Progress Notes (Signed)
 [facility] [address], [place] [address]    Clinic Day:  05/31/2023  Referring physician: Mirna Mires, MD  Patient Care Team: Mirna Mires, MD as PCP - General (Family Medicine) Wyline Mood Dorothe Pea, MD as PCP - Cardiology (Cardiology)   ASSESSMENT & PLAN:   Assessment: 1.  Normocytic anemia: - Patient seen at the request of Dr. Wolfgang Phoenix. 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 - Treated with Retacrit 3000 units every 2 weeks from 12/2020 through 10/13/2022.  Patient self discontinued. - Denies BRBPR/melena.  Denies prior transfusion.  Not on oral iron therapy.  Denies ice pica.  Colonoscopy?  20 years ago. - Prostate cancer 22 years ago, s/p XRT   2. Social/Family History: -Lives with his brother and son. Independent of ADL's and IADL's. Retired [employer]. No chemical exposure. Quit tobacco use over 20 years ago. He recently had his brother, wife, and daughter pass away. -No family history of anemia. Brother died of stomach cancer. Daughter died of cancer, type unknown, metastasized to the brain.    Plan: 1.  Normocytic anemia from CKD and functional iron deficiency: - We reviewed labs from 03/30/2023: Myeloma workup was negative.  Copper and folic acid were normal. - Received INFeD 1 g on 04/01/2023. - Hemoglobin today improved to 8.5. - RTC 6 weeks for follow-up.  Will repeat CBC, ferritin, iron panel and MMA. - If no improvement after B12 repleted, will consider Retacrit/Procrit.  2.  B12 deficiency: - B12 level was 312 with MMA of 512. - He will receive B12 1 mg IM x 1.  Will start him on B12 1 mg tablet daily.    Orders Placed This Encounter  Procedures   CBC    Standing Status:   Future    Expected Date:   07/12/2023    Expiration Date:   05/30/2024   Iron and TIBC ([facility] ONLY)    Standing Status:   Future    Expected Date:   07/12/2023    Expiration Date:   05/30/2024   Ferritin     Standing Status:   Future    Expected Date:   07/12/2023    Expiration Date:   05/30/2024   Methylmalonic acid, serum    Standing Status:   Future    Expected Date:   07/12/2023    Expiration Date:   05/30/2024   Sample to Blood Bank(Blood Bank Hold)    Standing Status:   Future    Number of Occurrences:   1    Expected Date:   05/31/2023    Expiration Date:   05/30/2024      I,Katie Daubenspeck,acting as a scribe for Doreatha Massed, MD.,have documented all relevant documentation on the behalf of Doreatha Massed, MD,as directed by  Doreatha Massed, MD while in the presence of Doreatha Massed, MD.   I, Doreatha Massed MD, have reviewed the above documentation for accuracy and completeness, and I agree with the above.   Doreatha Massed, MD   3/17/202512:07 PM  CHIEF COMPLAINT:   Diagnosis: Normocytic anemia    Cancer Staging  No matching staging information was found for the patient.    Prior Therapy: Retacrit, 12/2020 - 10/13/22  Current Therapy:  INFeD    HISTORY OF PRESENT ILLNESS:   Oncology History   No history exists.     INTERVAL HISTORY:   Nicholas Swanson is a 89 y.o. male presenting to clinic today for follow up of Normocytic  anemia. He was last seen by me on 03/30/23 in consultation.  Today, he states that he is doing well overall. His appetite level is at 75%. His energy level is at 75%.  PAST MEDICAL HISTORY:   Past Medical History: Past Medical History:  Diagnosis Date   CKD (chronic kidney disease)    COPD (chronic obstructive pulmonary disease) (HCC)    Diabetes mellitus    Hypertension     Surgical History: No past surgical history on file.  Social History: Social History   Socioeconomic History   Marital status: Married    Spouse name: Not on file   Number of children: Not on file   Years of education: Not on file   Highest education level: Not on file  Occupational History   Not on file  Tobacco Use   Smoking status:  Former    Types: Cigarettes   Smokeless tobacco: Never  Vaping Use   Vaping status: Never Used  Substance and Sexual Activity   Alcohol use: No   Drug use: No   Sexual activity: Not on file  Other Topics Concern   Not on file  Social History Narrative   Not on file   Social Drivers of Health   Financial Resource Strain: Not on file  Food Insecurity: No Food Insecurity (11/02/2022)   Hunger Vital Sign    Worried About Running Out of Food in the Last Year: Never true    Ran Out of Food in the Last Year: Never true  Transportation Needs: No Transportation Needs (11/02/2022)   PRAPARE - Administrator, Civil Service (Medical): No    Lack of Transportation (Non-Medical): No  Physical Activity: Not on file  Stress: Not on file  Social Connections: Not on file  Intimate Partner Violence: Not At Risk (11/02/2022)   Humiliation, Afraid, Rape, and Kick questionnaire    Fear of Current or Ex-Partner: No    Emotionally Abused: No    Physically Abused: No    Sexually Abused: No    Family History: No family history on file.  Current Medications:  Current Outpatient Medications:    acetaminophen (TYLENOL) 325 MG tablet, Take 2 tablets (650 mg total) by mouth every 4 (four) hours as needed for headache or mild pain., Disp: , Rfl:    albuterol (VENTOLIN HFA) 108 (90 Base) MCG/ACT inhaler, Inhale 1-2 puffs into the lungs every 6 (six) hours as needed for wheezing or shortness of breath. , Disp: , Rfl:    amLODipine (NORVASC) 10 MG tablet, Take 10 mg by mouth daily., Disp: , Rfl:    aspirin EC 81 MG tablet, Take 81 mg by mouth daily., Disp: , Rfl:    calcitRIOL (ROCALTROL) 0.25 MCG capsule, Take by mouth., Disp: , Rfl:    carvedilol (COREG) 6.25 MG tablet, Take 6.25 mg by mouth 2 (two) times daily., Disp: , Rfl:    clopidogrel (PLAVIX) 75 MG tablet, Take 1 tablet (75 mg total) by mouth daily., Disp: 30 tablet, Rfl: 1   Cyanocobalamin (B-12) 1000 MCG CAPS, Take 1 mg by mouth  daily., Disp: 90 capsule, Rfl: 3   furosemide (LASIX) 20 MG tablet, Take 20 mg by mouth 2 (two) times daily., Disp: , Rfl:    hydrALAZINE (APRESOLINE) 100 MG tablet, Take 100 mg by mouth 3 (three) times daily., Disp: , Rfl:    mirtazapine (REMERON) 7.5 MG tablet, Take 7.5 mg by mouth at bedtime., Disp: , Rfl:    atorvastatin (LIPITOR) 40  MG tablet, Take 1 tablet (40 mg total) by mouth daily., Disp: 30 tablet, Rfl: 0   Allergies: Allergies  Allergen Reactions   Ace Inhibitors Other (See Comments)    REACTION: renal insufficiency   Simvastatin Other (See Comments)    REACTION: myalgia    REVIEW OF SYSTEMS:   Review of Systems  Constitutional:  Negative for chills, fatigue and fever.  HENT:   Negative for lump/mass, mouth sores, nosebleeds, sore throat and trouble swallowing.   Eyes:  Negative for eye problems.  Respiratory:  Positive for cough and shortness of breath.   Cardiovascular:  Negative for chest pain, leg swelling and palpitations.  Gastrointestinal:  Negative for abdominal pain, constipation, diarrhea, nausea and vomiting.  Genitourinary:  Negative for bladder incontinence, difficulty urinating, dysuria, frequency, hematuria and nocturia.   Musculoskeletal:  Negative for arthralgias, back pain, flank pain, myalgias and neck pain.  Skin:  Negative for itching and rash.  Neurological:  Negative for dizziness, headaches and numbness.  Hematological:  Does not bruise/bleed easily.  Psychiatric/Behavioral:  Negative for depression, sleep disturbance and suicidal ideas. The patient is not nervous/anxious.   All other systems reviewed and are negative.    VITALS:   Blood pressure (!) 181/58, pulse 65, temperature 97.8 F (36.6 C), temperature source Oral, resp. rate 16, weight 164 lb 0.4 oz (74.4 kg), SpO2 96%.  Wt Readings from Last 3 Encounters:  05/31/23 164 lb 0.4 oz (74.4 kg)  04/01/23 171 lb 12.8 oz (77.9 kg)  03/30/23 169 lb 1.5 oz (76.7 kg)    Body mass index is  24.22 kg/m.  Performance status (ECOG): 1 - Symptomatic but completely ambulatory  PHYSICAL EXAM:   Physical Exam Vitals and nursing note reviewed. Exam conducted with a chaperone present.  Constitutional:      Appearance: Normal appearance.  Cardiovascular:     Rate and Rhythm: Normal rate and regular rhythm.     Pulses: Normal pulses.     Heart sounds: Normal heart sounds.  Pulmonary:     Effort: Pulmonary effort is normal.     Breath sounds: Normal breath sounds.  Abdominal:     Palpations: Abdomen is soft. There is no hepatomegaly, splenomegaly or mass.     Tenderness: There is no abdominal tenderness.  Musculoskeletal:     Right lower leg: No edema.     Left lower leg: No edema.  Lymphadenopathy:     Cervical: No cervical adenopathy.     Right cervical: No superficial, deep or posterior cervical adenopathy.    Left cervical: No superficial, deep or posterior cervical adenopathy.     Upper Body:     Right upper body: No supraclavicular or axillary adenopathy.     Left upper body: No supraclavicular or axillary adenopathy.  Neurological:     General: No focal deficit present.     Mental Status: He is alert and oriented to person, place, and time.  Psychiatric:        Mood and Affect: Mood normal.        Behavior: Behavior normal.     LABS:   CBC     Component Value Date/Time   WBC 7.5 05/31/2023 0925   RBC 2.85 (L) 05/31/2023 0925   HGB 8.5 (L) 05/31/2023 0925   HCT 28.1 (L) 05/31/2023 0925   PLT 249 05/31/2023 0925   MCV 98.6 05/31/2023 0925   MCH 29.8 05/31/2023 0925   MCHC 30.2 05/31/2023 0925   RDW 13.7 05/31/2023 0925  LYMPHSABS 2.0 03/30/2023 1557   MONOABS 0.9 03/30/2023 1557   EOSABS 0.3 03/30/2023 1557   BASOSABS 0.0 03/30/2023 1557    CMP      Component Value Date/Time   NA 136 12/02/2022 1205   K 4.5 12/02/2022 1205   CL 105 12/02/2022 1205   CO2 23 12/02/2022 1205   GLUCOSE 140 (H) 12/02/2022 1205   BUN 43 (H) 12/02/2022 1205    CREATININE 3.36 (H) 12/02/2022 1205   CALCIUM 8.3 (L) 12/02/2022 1205   CALCIUM 8.6 06/19/2022 1024   PROT 5.6 (L) 03/20/2019 0548   ALBUMIN 3.7 12/02/2022 1205   AST 22 03/20/2019 0548   ALT 42 03/20/2019 0548   ALKPHOS 55 03/20/2019 0548   BILITOT 0.8 03/20/2019 0548   GFRNONAA 17 (L) 12/02/2022 1205   GFRAA 28 (L) 03/20/2019 0548     No results found for: "CEA1", "CEA" / No results found for: "CEA1", "CEA" No results found for: "PSA1" No results found for: "CAN199" No results found for: "CAN125"  Lab Results  Component Value Date   TOTALPROTELP 6.7 03/30/2023   ALBUMINELP 3.5 03/30/2023   A1GS 0.3 03/30/2023   A2GS 0.8 03/30/2023   BETS 1.2 03/30/2023   GAMS 0.9 03/30/2023   MSPIKE Not Observed 03/30/2023   SPEI Comment 03/30/2023   Lab Results  Component Value Date   TIBC 227 (L) 03/30/2023   TIBC 244 (L) 12/02/2022   TIBC 240 (L) 08/18/2022   FERRITIN 96 03/30/2023   FERRITIN 72 12/02/2022   FERRITIN 61 08/18/2022   IRONPCTSAT 23 03/30/2023   IRONPCTSAT 39 12/02/2022   IRONPCTSAT 25 08/18/2022   Lab Results  Component Value Date   LDH 150 03/30/2023   LDH 459 (H) 03/11/2019     STUDIES:   No results found.

## 2023-05-31 ENCOUNTER — Inpatient Hospital Stay (HOSPITAL_BASED_OUTPATIENT_CLINIC_OR_DEPARTMENT_OTHER): Admitting: Hematology

## 2023-05-31 ENCOUNTER — Inpatient Hospital Stay: Payer: Medicare Other

## 2023-05-31 ENCOUNTER — Inpatient Hospital Stay: Attending: Hematology

## 2023-05-31 ENCOUNTER — Inpatient Hospital Stay: Payer: Medicare Other | Admitting: Hematology

## 2023-05-31 ENCOUNTER — Inpatient Hospital Stay

## 2023-05-31 VITALS — BP 181/58 | HR 65 | Temp 97.8°F | Resp 16 | Wt 164.0 lb

## 2023-05-31 DIAGNOSIS — D509 Iron deficiency anemia, unspecified: Secondary | ICD-10-CM | POA: Diagnosis not present

## 2023-05-31 DIAGNOSIS — I129 Hypertensive chronic kidney disease with stage 1 through stage 4 chronic kidney disease, or unspecified chronic kidney disease: Secondary | ICD-10-CM | POA: Insufficient documentation

## 2023-05-31 DIAGNOSIS — N184 Chronic kidney disease, stage 4 (severe): Secondary | ICD-10-CM | POA: Insufficient documentation

## 2023-05-31 DIAGNOSIS — Z79899 Other long term (current) drug therapy: Secondary | ICD-10-CM | POA: Insufficient documentation

## 2023-05-31 DIAGNOSIS — Z923 Personal history of irradiation: Secondary | ICD-10-CM | POA: Diagnosis not present

## 2023-05-31 DIAGNOSIS — Z87891 Personal history of nicotine dependence: Secondary | ICD-10-CM | POA: Diagnosis not present

## 2023-05-31 DIAGNOSIS — D631 Anemia in chronic kidney disease: Secondary | ICD-10-CM

## 2023-05-31 DIAGNOSIS — D508 Other iron deficiency anemias: Secondary | ICD-10-CM

## 2023-05-31 DIAGNOSIS — E538 Deficiency of other specified B group vitamins: Secondary | ICD-10-CM | POA: Insufficient documentation

## 2023-05-31 DIAGNOSIS — E1122 Type 2 diabetes mellitus with diabetic chronic kidney disease: Secondary | ICD-10-CM | POA: Diagnosis not present

## 2023-05-31 DIAGNOSIS — Z7982 Long term (current) use of aspirin: Secondary | ICD-10-CM | POA: Insufficient documentation

## 2023-05-31 DIAGNOSIS — Z7902 Long term (current) use of antithrombotics/antiplatelets: Secondary | ICD-10-CM | POA: Insufficient documentation

## 2023-05-31 DIAGNOSIS — Z8546 Personal history of malignant neoplasm of prostate: Secondary | ICD-10-CM | POA: Insufficient documentation

## 2023-05-31 LAB — CBC
HCT: 28.1 % — ABNORMAL LOW (ref 39.0–52.0)
Hemoglobin: 8.5 g/dL — ABNORMAL LOW (ref 13.0–17.0)
MCH: 29.8 pg (ref 26.0–34.0)
MCHC: 30.2 g/dL (ref 30.0–36.0)
MCV: 98.6 fL (ref 80.0–100.0)
Platelets: 249 10*3/uL (ref 150–400)
RBC: 2.85 MIL/uL — ABNORMAL LOW (ref 4.22–5.81)
RDW: 13.7 % (ref 11.5–15.5)
WBC: 7.5 10*3/uL (ref 4.0–10.5)
nRBC: 0 % (ref 0.0–0.2)

## 2023-05-31 LAB — SAMPLE TO BLOOD BANK

## 2023-05-31 MED ORDER — CYANOCOBALAMIN 1000 MCG/ML IJ SOLN
1000.0000 ug | Freq: Once | INTRAMUSCULAR | Status: AC
  Administered 2023-05-31: 1000 ug via INTRAMUSCULAR
  Filled 2023-05-31: qty 1

## 2023-05-31 MED ORDER — B-12 1000 MCG PO CAPS: 1.0000 mg | ORAL_CAPSULE | Freq: Every day | ORAL | 3 refills | Status: AC

## 2023-05-31 NOTE — Patient Instructions (Signed)
 [facility] [place] - A DEPT OF [facility]  Discharge Instructions: Thank you for choosing [facility] to provide your oncology and hematology care.  If you have a lab appointment with the Cancer Center - please note that after April 8th, 2024, all labs will be drawn in the cancer center.  You do not have to check in or register with the main entrance as you have in the past but will complete your check-in in the cancer center.  Wear comfortable clothing and clothing appropriate for easy access to any Portacath or PICC line.   We strive to give you quality time with your provider. You may need to reschedule your appointment if you arrive late (15 or more minutes).  Arriving late affects you and other patients whose appointments are after yours.  Also, if you miss three or more appointments without notifying the office, you may be dismissed from the clinic at the provider's discretion.      For prescription refill requests, have your pharmacy contact our office and allow 72 hours for refills to be completed.    Today you received a B-12 injection    To help prevent nausea and vomiting after your treatment, we encourage you to take your nausea medication as directed.  BELOW ARE SYMPTOMS THAT SHOULD BE REPORTED IMMEDIATELY: *FEVER GREATER THAN 100.4 F (38 C) OR HIGHER *CHILLS OR SWEATING *NAUSEA AND VOMITING THAT IS NOT CONTROLLED WITH YOUR NAUSEA MEDICATION *UNUSUAL SHORTNESS OF BREATH *UNUSUAL BRUISING OR BLEEDING *URINARY PROBLEMS (pain or burning when urinating, or frequent urination) *BOWEL PROBLEMS (unusual diarrhea, constipation, pain near the anus) TENDERNESS IN MOUTH AND THROAT WITH OR WITHOUT PRESENCE OF ULCERS (sore throat, sores in mouth, or a toothache) UNUSUAL RASH, SWELLING OR PAIN  UNUSUAL VAGINAL DISCHARGE OR ITCHING   Items with * indicate a potential emergency and should be followed up as soon as possible or go to the Emergency  Department if any problems should occur.  Please show the CHEMOTHERAPY ALERT CARD or IMMUNOTHERAPY ALERT CARD at check-in to the Emergency Department and triage nurse.  Should you have questions after your visit or need to cancel or reschedule your appointment, please contact [facility]  - A DEPT OF [facility] [phone]  and follow the prompts.  Office hours are 8:00 a.m. to 4:30 p.m. Monday - Friday. Please note that voicemails left after 4:00 p.m. may not be returned until the following business day.  We are closed weekends and major holidays. You have access to a nurse at all times for urgent questions. Please call the main number to the clinic [phone] and follow the prompts.  For any non-urgent questions, you may also contact your provider using MyChart. We now offer e-Visits for anyone 42 and older to request care online for non-urgent symptoms. For details visit [email or web address].   Also download the MyChart app! Go to the app store, search "MyChart", open the app, select [place], and log in with your MyChart username and password.

## 2023-05-31 NOTE — Progress Notes (Signed)
Patient in clinic today for follow up visit. Physician ordered B 12 injection. See MAR for injection information. Patient remained stable throughout injection. Patient discharged from clinic ambulatory and in stable condition.  

## 2023-05-31 NOTE — Patient Instructions (Signed)
 [facility] Discharge Instructions   You were seen and examined today by Dr. Ellin Saba.  He reviewed the results of your lab work which are mostly normal/stable. Your B12 is low. We will give you a B12 injection today. We will also send a prescription to your pharmacy for a B12 tablet 1 mg daily.   We will see you back in 6 weeks. We will repeat lab work at that time.    Return as scheduled.    Thank you for choosing  Cancer Center at [facility] to provide your oncology and hematology care.  To afford each patient quality time with our provider, please arrive at least 15 minutes before your scheduled appointment time.   If you have a lab appointment with the Cancer Center please come in thru the Main Entrance and check in at the main information desk.  You need to re-schedule your appointment should you arrive 10 or more minutes late.  We strive to give you quality time with our providers, and arriving late affects you and other patients whose appointments are after yours.  Also, if you no show three or more times for appointments you may be dismissed from the clinic at the providers discretion.     Again, thank you for choosing [facility].  Our hope is that these requests will decrease the amount of time that you wait before being seen by our physicians.       _____________________________________________________________  Should you have questions after your visit to [facility], please contact our office at [phone] and follow the prompts.  Our office hours are 8:00 a.m. and 4:30 p.m. Monday - Friday.  Please note that voicemails left after 4:00 p.m. may not be returned until the following business day.  We are closed weekends and major holidays.  You do have access to a nurse 24-7, just call the main number to the clinic [phone] and do not press any options, hold on the line and a nurse will answer  the phone.    For prescription refill requests, have your pharmacy contact our office and allow 72 hours.    Due to Covid, you will need to wear a mask upon entering the hospital. If you do not have a mask, a mask will be given to you at the Main Entrance upon arrival. For doctor visits, patients may have 1 support person age 89 or older with them. For treatment visits, patients can not have anyone with them due to social distancing guidelines and our immunocompromised population.

## 2023-07-13 ENCOUNTER — Inpatient Hospital Stay: Admitting: Hematology

## 2023-07-13 ENCOUNTER — Inpatient Hospital Stay

## 2023-07-13 NOTE — Progress Notes (Incomplete)
 [facility] [address] [place] [address]    Clinic Day:  07/13/2023  Referring physician: Wilburn Handler, MD  Patient Care Team: Wilburn Handler, MD as PCP - General (Family Medicine) Amanda Jungling Joyceann No, MD as PCP - Cardiology (Cardiology)   ASSESSMENT & PLAN:   Assessment: 1.  Normocytic anemia: - Patient seen at the request of Dr. Carrolyn Clan. - Anemia from combination of CKD and functional iron  deficiency. - Treated with Retacrit  3000 units every 2 weeks from 12/2020 through 10/13/2022.  Patient self discontinued. - Denies BRBPR/melena.  Denies prior transfusion.  Not on oral iron  therapy.  Denies ice pica.  Colonoscopy?  20 years ago. - Prostate cancer 22 years ago, s/p XRT   2. Social/Family History: -Lives with his brother and son. Independent of ADL's and IADL's. Retired [employer]. No chemical exposure. Quit tobacco use over 20 years ago. He recently had his brother, wife, and daughter pass away. -No family history of anemia. Brother died of stomach cancer. Daughter died of cancer, type unknown, metastasized to the brain.    Plan: 1.  Normocytic anemia from CKD and functional iron  deficiency: - We reviewed labs from 03/30/2023: Myeloma workup was negative.  Copper  and folic acid  were normal. - Received INFeD  1 g on 04/01/2023. - Hemoglobin today improved to 8.5. - RTC 6 weeks for follow-up.  Will repeat CBC, ferritin, iron  panel and MMA. - If no improvement after B12 repleted, will consider Retacrit /Procrit.  2.  B12 deficiency: - B12 level was 312 with MMA of 512. - He will receive B12 1 mg IM x 1.  Will start him on B12 1 mg tablet daily.    No orders of the defined types were placed in this encounter.     Nadeen Augusta Teague,acting as a Neurosurgeon for Paulett Boros, MD.,have documented all relevant documentation on the behalf of Paulett Boros, MD,as directed by  Paulett Boros, MD while in the presence of Paulett Boros,  MD.  ***   [place] R Teague   4/29/20257:54 AM  CHIEF COMPLAINT:   Diagnosis: Normocytic anemia    Cancer Staging  No matching staging information was found for the patient.    Prior Therapy: Retacrit , 12/2020 - 10/13/22  Current Therapy:  INFeD     HISTORY OF PRESENT ILLNESS:   Oncology History   No history exists.     INTERVAL HISTORY:   Nicholas Swanson is a 89 y.o. male presenting to clinic today for follow up of Normocytic anemia. He was last seen by me on 05/31/23.  Today, he states that he is doing well overall. His appetite level is at ***%. His energy level is at ***%.  PAST MEDICAL HISTORY:   Past Medical History: Past Medical History:  Diagnosis Date   CKD (chronic kidney disease)    COPD (chronic obstructive pulmonary disease) (HCC)    Diabetes mellitus    Hypertension     Surgical History: No past surgical history on file.  Social History: Social History   Socioeconomic History   Marital status: Married    Spouse name: Not on file   Number of children: Not on file   Years of education: Not on file   Highest education level: Not on file  Occupational History   Not on file  Tobacco Use   Smoking status: Former    Types: Cigarettes   Smokeless tobacco: Never  Vaping Use   Vaping status: Never Used  Substance and Sexual Activity  Alcohol use: No   Drug use: No   Sexual activity: Not on file  Other Topics Concern   Not on file  Social History Narrative   Not on file   Social Drivers of Health   Financial Resource Strain: Not on file  Food Insecurity: No Food Insecurity (11/02/2022)   Hunger Vital Sign    Worried About Running Out of Food in the Last Year: Never true    Ran Out of Food in the Last Year: Never true  Transportation Needs: No Transportation Needs (11/02/2022)   PRAPARE - Administrator, Civil Service (Medical): No    Lack of Transportation (Non-Medical): No  Physical Activity: Not on file  Stress: Not on file   Social Connections: Not on file  Intimate Partner Violence: Not At Risk (11/02/2022)   Humiliation, Afraid, Rape, and Kick questionnaire    Fear of Current or Ex-Partner: No    Emotionally Abused: No    Physically Abused: No    Sexually Abused: No    Family History: No family history on file.  Current Medications:  Current Outpatient Medications:    acetaminophen  (TYLENOL ) 325 MG tablet, Take 2 tablets (650 mg total) by mouth every 4 (four) hours as needed for headache or mild pain., Disp: , Rfl:    albuterol  (VENTOLIN  HFA) 108 (90 Base) MCG/ACT inhaler, Inhale 1-2 puffs into the lungs every 6 (six) hours as needed for wheezing or shortness of breath. , Disp: , Rfl:    amLODipine  (NORVASC ) 10 MG tablet, Take 10 mg by mouth daily., Disp: , Rfl:    aspirin  EC 81 MG tablet, Take 81 mg by mouth daily., Disp: , Rfl:    atorvastatin  (LIPITOR) 40 MG tablet, Take 1 tablet (40 mg total) by mouth daily., Disp: 30 tablet, Rfl: 0   calcitRIOL  (ROCALTROL ) 0.25 MCG capsule, Take by mouth., Disp: , Rfl:    carvedilol  (COREG ) 6.25 MG tablet, Take 6.25 mg by mouth 2 (two) times daily., Disp: , Rfl:    clopidogrel  (PLAVIX ) 75 MG tablet, Take 1 tablet (75 mg total) by mouth daily., Disp: 30 tablet, Rfl: 1   Cyanocobalamin  (B-12) 1000 MCG CAPS, Take 1 mg by mouth daily., Disp: 90 capsule, Rfl: 3   furosemide (LASIX) 20 MG tablet, Take 20 mg by mouth 2 (two) times daily., Disp: , Rfl:    hydrALAZINE  (APRESOLINE ) 100 MG tablet, Take 100 mg by mouth 3 (three) times daily., Disp: , Rfl:    mirtazapine (REMERON) 7.5 MG tablet, Take 7.5 mg by mouth at bedtime., Disp: , Rfl:    Allergies: Allergies  Allergen Reactions   Ace Inhibitors Other (See Comments)    REACTION: renal insufficiency   Simvastatin Other (See Comments)    REACTION: myalgia    REVIEW OF SYSTEMS:   Review of Systems  Constitutional:  Negative for chills, fatigue and fever.  HENT:   Negative for lump/mass, mouth sores, nosebleeds,  sore throat and trouble swallowing.   Eyes:  Negative for eye problems.  Respiratory:  Negative for cough and shortness of breath.   Cardiovascular:  Negative for chest pain, leg swelling and palpitations.  Gastrointestinal:  Negative for abdominal pain, constipation, diarrhea, nausea and vomiting.  Genitourinary:  Negative for bladder incontinence, difficulty urinating, dysuria, frequency, hematuria and nocturia.   Musculoskeletal:  Negative for arthralgias, back pain, flank pain, myalgias and neck pain.  Skin:  Negative for itching and rash.  Neurological:  Negative for dizziness, headaches and numbness.  Hematological:  Does not bruise/bleed easily.  Psychiatric/Behavioral:  Negative for depression, sleep disturbance and suicidal ideas. The patient is not nervous/anxious.   All other systems reviewed and are negative.    VITALS:   There were no vitals taken for this visit.  Wt Readings from Last 3 Encounters:  05/31/23 164 lb 0.4 oz (74.4 kg)  04/01/23 171 lb 12.8 oz (77.9 kg)  03/30/23 169 lb 1.5 oz (76.7 kg)    There is no height or weight on file to calculate BMI.  Performance status (ECOG): 1 - Symptomatic but completely ambulatory  PHYSICAL EXAM:   Physical Exam Vitals and nursing note reviewed. Exam conducted with a chaperone present.  Constitutional:      Appearance: Normal appearance.  Cardiovascular:     Rate and Rhythm: Normal rate and regular rhythm.     Pulses: Normal pulses.     Heart sounds: Normal heart sounds.  Pulmonary:     Effort: Pulmonary effort is normal.     Breath sounds: Normal breath sounds.  Abdominal:     Palpations: Abdomen is soft. There is no hepatomegaly, splenomegaly or mass.     Tenderness: There is no abdominal tenderness.  Musculoskeletal:     Right lower leg: No edema.     Left lower leg: No edema.  Lymphadenopathy:     Cervical: No cervical adenopathy.     Right cervical: No superficial, deep or posterior cervical adenopathy.     Left cervical: No superficial, deep or posterior cervical adenopathy.     Upper Body:     Right upper body: No supraclavicular or axillary adenopathy.     Left upper body: No supraclavicular or axillary adenopathy.  Neurological:     General: No focal deficit present.     Mental Status: He is alert and oriented to person, place, and time.  Psychiatric:        Mood and Affect: Mood normal.        Behavior: Behavior normal.     LABS:   CBC     Component Value Date/Time   WBC 7.5 05/31/2023 0925   RBC 2.85 (L) 05/31/2023 0925   HGB 8.5 (L) 05/31/2023 0925   HCT 28.1 (L) 05/31/2023 0925   PLT 249 05/31/2023 0925   MCV 98.6 05/31/2023 0925   MCH 29.8 05/31/2023 0925   MCHC 30.2 05/31/2023 0925   RDW 13.7 05/31/2023 0925   LYMPHSABS 2.0 03/30/2023 1557   MONOABS 0.9 03/30/2023 1557   EOSABS 0.3 03/30/2023 1557   BASOSABS 0.0 03/30/2023 1557    CMP      Component Value Date/Time   NA 136 12/02/2022 1205   K 4.5 12/02/2022 1205   CL 105 12/02/2022 1205   CO2 23 12/02/2022 1205   GLUCOSE 140 (H) 12/02/2022 1205   BUN 43 (H) 12/02/2022 1205   CREATININE 3.36 (H) 12/02/2022 1205   CALCIUM  8.3 (L) 12/02/2022 1205   CALCIUM  8.6 06/19/2022 1024   PROT 5.6 (L) 03/20/2019 0548   ALBUMIN 3.7 12/02/2022 1205   AST 22 03/20/2019 0548   ALT 42 03/20/2019 0548   ALKPHOS 55 03/20/2019 0548   BILITOT 0.8 03/20/2019 0548   GFRNONAA 17 (L) 12/02/2022 1205   GFRAA 28 (L) 03/20/2019 0548     No results found for: "CEA1", "CEA" / No results found for: "CEA1", "CEA" No results found for: "PSA1" No results found for: "ZOX096" No results found for: "EAV409"  Lab Results  Component Value Date   TOTALPROTELP 6.7  03/30/2023   ALBUMINELP 3.5 03/30/2023   A1GS 0.3 03/30/2023   A2GS 0.8 03/30/2023   BETS 1.2 03/30/2023   GAMS 0.9 03/30/2023   MSPIKE Not Observed 03/30/2023   SPEI Comment 03/30/2023   Lab Results  Component Value Date   TIBC 227 (L) 03/30/2023   TIBC 244 (L)  12/02/2022   TIBC 240 (L) 08/18/2022   FERRITIN 96 03/30/2023   FERRITIN 72 12/02/2022   FERRITIN 61 08/18/2022   IRONPCTSAT 23 03/30/2023   IRONPCTSAT 39 12/02/2022   IRONPCTSAT 25 08/18/2022   Lab Results  Component Value Date   LDH 150 03/30/2023   LDH 459 (H) 03/11/2019     STUDIES:   No results found.

## 2023-07-26 DIAGNOSIS — E7800 Pure hypercholesterolemia, unspecified: Secondary | ICD-10-CM | POA: Diagnosis not present

## 2023-07-26 DIAGNOSIS — E1122 Type 2 diabetes mellitus with diabetic chronic kidney disease: Secondary | ICD-10-CM | POA: Diagnosis not present

## 2023-07-26 DIAGNOSIS — I2510 Atherosclerotic heart disease of native coronary artery without angina pectoris: Secondary | ICD-10-CM | POA: Diagnosis not present

## 2023-07-26 DIAGNOSIS — J449 Chronic obstructive pulmonary disease, unspecified: Secondary | ICD-10-CM | POA: Diagnosis not present

## 2023-07-26 DIAGNOSIS — D631 Anemia in chronic kidney disease: Secondary | ICD-10-CM | POA: Diagnosis not present

## 2023-07-26 DIAGNOSIS — I1311 Hypertensive heart and chronic kidney disease without heart failure, with stage 5 chronic kidney disease, or end stage renal disease: Secondary | ICD-10-CM | POA: Diagnosis not present

## 2023-07-26 DIAGNOSIS — Z0000 Encounter for general adult medical examination without abnormal findings: Secondary | ICD-10-CM | POA: Diagnosis not present

## 2023-07-26 DIAGNOSIS — N185 Chronic kidney disease, stage 5: Secondary | ICD-10-CM | POA: Diagnosis not present

## 2023-07-28 DIAGNOSIS — E1129 Type 2 diabetes mellitus with other diabetic kidney complication: Secondary | ICD-10-CM | POA: Diagnosis not present

## 2023-07-28 DIAGNOSIS — E8722 Chronic metabolic acidosis: Secondary | ICD-10-CM | POA: Diagnosis not present

## 2023-07-28 DIAGNOSIS — R809 Proteinuria, unspecified: Secondary | ICD-10-CM | POA: Diagnosis not present

## 2023-07-28 DIAGNOSIS — N184 Chronic kidney disease, stage 4 (severe): Secondary | ICD-10-CM | POA: Diagnosis not present

## 2023-08-10 NOTE — Progress Notes (Signed)
 [facility] [address], [place] [address]    Clinic Day:  08/11/2023  Referring physician: Wilburn Handler, MD  Patient Care Team: Wilburn Handler, MD as PCP - General (Family Medicine) Amanda Jungling Joyceann No, MD as PCP - Cardiology (Cardiology)   ASSESSMENT & PLAN:   Assessment: 1.  Normocytic anemia: - Patient seen at the request of Dr. Carrolyn Clan. - Anemia from combination of CKD and functional iron  deficiency. - Treated with Retacrit  3000 units every 2 weeks from 12/2020 through 10/13/2022.  Patient self discontinued. - Denies BRBPR/melena.  Denies prior transfusion.  Not on oral iron  therapy.  Denies ice pica.  Colonoscopy?  20 years ago. - Prostate cancer 22 years ago, s/p XRT   2. Social/Family History: -Lives with his brother and son. Independent of ADL's and IADL's. Retired [employer]. No chemical exposure. Quit tobacco use over 20 years ago. He recently had his brother, wife, and daughter pass away. -No family history of anemia. Brother died of stomach cancer. Daughter died of cancer, type unknown, metastasized to the brain.    Plan: 1.  Normocytic anemia from CKD and functional iron  deficiency: - He received INFeD  on 04/01/2023. - Denies any BRBPR/melena. - Hemoglobin today improved to 9.3.  Ferritin is 125 and saturation is 34. - He still complains of decreased energy levels.  We talked about restarting him back on Retacrit  but less frequently.  Will start him at 10,000 units every 4 weeks and titrate up the dose as needed. - I would also recommend another dose of INFeD . - RTC 12 weeks for follow-up with repeat CBC, ferritin and iron  panel.  2.  B12 deficiency: - Continue B12 1 mg tablet daily.  He is taking it when he remembers it.  We have sent a MMA level which is pending today.    No orders of the defined types were placed in this encounter.     Nadeen Augusta Teague,acting as a Neurosurgeon for Paulett Boros, MD.,have documented all relevant  documentation on the behalf of Paulett Boros, MD,as directed by  Paulett Boros, MD while in the presence of Paulett Boros, MD.  I, Paulett Boros MD, have reviewed the above documentation for accuracy and completeness, and I agree with the above.    Paulett Boros, MD   5/28/20252:24 PM  CHIEF COMPLAINT:   Diagnosis: Normocytic anemia    Cancer Staging  No matching staging information was found for the patient.    Prior Therapy: Retacrit , 12/2020 - 10/13/22  Current Therapy:  INFeD     HISTORY OF PRESENT ILLNESS:   Oncology History   No history exists.     INTERVAL HISTORY:   Bosco is a 89 y.o. male presenting to clinic today for follow up of Normocytic anemia. He was last seen by me on 05/31/23.  Today, he states that he is doing well overall. His appetite level is at 100%. His energy level is at 100%.  PAST MEDICAL HISTORY:   Past Medical History: Past Medical History:  Diagnosis Date   CKD (chronic kidney disease)    COPD (chronic obstructive pulmonary disease) (HCC)    Diabetes mellitus    Hypertension     Surgical History: History reviewed. No pertinent surgical history.  Social History: Social History   Socioeconomic History   Marital status: Married    Spouse name: Not on file   Number of children: Not on file   Years of education: Not on file   Highest education level:  Not on file  Occupational History   Not on file  Tobacco Use   Smoking status: Former    Types: Cigarettes   Smokeless tobacco: Never  Vaping Use   Vaping status: Never Used  Substance and Sexual Activity   Alcohol use: No   Drug use: No   Sexual activity: Not on file  Other Topics Concern   Not on file  Social History Narrative   Not on file   Social Drivers of Health   Financial Resource Strain: Not on file  Food Insecurity: No Food Insecurity (11/02/2022)   Hunger Vital Sign    Worried About Running Out of Food in the Last Year: Never  true    Ran Out of Food in the Last Year: Never true  Transportation Needs: No Transportation Needs (11/02/2022)   PRAPARE - Administrator, Civil Service (Medical): No    Lack of Transportation (Non-Medical): No  Physical Activity: Not on file  Stress: Not on file  Social Connections: Not on file  Intimate Partner Violence: Not At Risk (11/02/2022)   Humiliation, Afraid, Rape, and Kick questionnaire    Fear of Current or Ex-Partner: No    Emotionally Abused: No    Physically Abused: No    Sexually Abused: No    Family History: History reviewed. No pertinent family history.  Current Medications:  Current Outpatient Medications:    acetaminophen  (TYLENOL ) 325 MG tablet, Take 2 tablets (650 mg total) by mouth every 4 (four) hours as needed for headache or mild pain., Disp: , Rfl:    albuterol  (VENTOLIN  HFA) 108 (90 Base) MCG/ACT inhaler, Inhale 1-2 puffs into the lungs every 6 (six) hours as needed for wheezing or shortness of breath. , Disp: , Rfl:    amLODipine  (NORVASC ) 10 MG tablet, Take 10 mg by mouth daily., Disp: , Rfl:    aspirin  EC 81 MG tablet, Take 81 mg by mouth daily., Disp: , Rfl:    calcitRIOL  (ROCALTROL ) 0.25 MCG capsule, Take by mouth., Disp: , Rfl:    carvedilol  (COREG ) 6.25 MG tablet, Take 6.25 mg by mouth 2 (two) times daily., Disp: , Rfl:    clopidogrel  (PLAVIX ) 75 MG tablet, Take 1 tablet (75 mg total) by mouth daily., Disp: 30 tablet, Rfl: 1   Cyanocobalamin  (B-12) 1000 MCG CAPS, Take 1 mg by mouth daily., Disp: 90 capsule, Rfl: 3   furosemide (LASIX) 20 MG tablet, Take 20 mg by mouth 2 (two) times daily., Disp: , Rfl:    hydrALAZINE  (APRESOLINE ) 100 MG tablet, Take 100 mg by mouth 3 (three) times daily., Disp: , Rfl:    mirtazapine (REMERON) 7.5 MG tablet, Take 7.5 mg by mouth at bedtime., Disp: , Rfl:    atorvastatin  (LIPITOR) 40 MG tablet, Take 1 tablet (40 mg total) by mouth daily., Disp: 30 tablet, Rfl: 0   Allergies: Allergies  Allergen  Reactions   Ace Inhibitors Other (See Comments)    REACTION: renal insufficiency   Simvastatin Other (See Comments)    REACTION: myalgia    REVIEW OF SYSTEMS:   Review of Systems  Constitutional:  Positive for fatigue. Negative for chills and fever.  HENT:   Negative for lump/mass, mouth sores, nosebleeds, sore throat and trouble swallowing.   Eyes:  Negative for eye problems.  Respiratory:  Negative for cough and shortness of breath.   Cardiovascular:  Negative for chest pain, leg swelling and palpitations.  Gastrointestinal:  Negative for abdominal pain, constipation, diarrhea, nausea and vomiting.  Genitourinary:  Negative for bladder incontinence, difficulty urinating, dysuria, frequency, hematuria and nocturia.   Musculoskeletal:  Negative for arthralgias, back pain, flank pain, myalgias and neck pain.  Skin:  Negative for itching and rash.  Neurological:  Positive for numbness. Negative for dizziness and headaches.  Hematological:  Does not bruise/bleed easily.  Psychiatric/Behavioral:  Negative for depression, sleep disturbance and suicidal ideas. The patient is not nervous/anxious.   All other systems reviewed and are negative.    VITALS:   Blood pressure (!) 172/81, pulse (!) 57, temperature 97.7 F (36.5 C), temperature source Tympanic, resp. rate 17, height 5\' 11"  (1.803 m), weight 168 lb 3.2 oz (76.3 kg), SpO2 96%.  Wt Readings from Last 3 Encounters:  08/11/23 168 lb 3.2 oz (76.3 kg)  05/31/23 164 lb 0.4 oz (74.4 kg)  04/01/23 171 lb 12.8 oz (77.9 kg)    Body mass index is 23.46 kg/m.  Performance status (ECOG): 1 - Symptomatic but completely ambulatory  PHYSICAL EXAM:   Physical Exam Vitals and nursing note reviewed. Exam conducted with a chaperone present.  Constitutional:      Appearance: Normal appearance.  Cardiovascular:     Rate and Rhythm: Normal rate and regular rhythm.     Pulses: Normal pulses.     Heart sounds: Normal heart sounds.  Pulmonary:      Effort: Pulmonary effort is normal.     Breath sounds: Normal breath sounds.  Abdominal:     Palpations: Abdomen is soft. There is no hepatomegaly, splenomegaly or mass.     Tenderness: There is no abdominal tenderness.  Musculoskeletal:     Right lower leg: No edema.     Left lower leg: No edema.  Lymphadenopathy:     Cervical: No cervical adenopathy.     Right cervical: No superficial, deep or posterior cervical adenopathy.    Left cervical: No superficial, deep or posterior cervical adenopathy.     Upper Body:     Right upper body: No supraclavicular or axillary adenopathy.     Left upper body: No supraclavicular or axillary adenopathy.  Neurological:     General: No focal deficit present.     Mental Status: He is alert and oriented to person, place, and time.  Psychiatric:        Mood and Affect: Mood normal.        Behavior: Behavior normal.     LABS:   CBC     Component Value Date/Time   WBC 8.1 08/11/2023 1044   RBC 2.99 (L) 08/11/2023 1044   HGB 9.3 (L) 08/11/2023 1044   HCT 28.8 (L) 08/11/2023 1044   PLT 328 08/11/2023 1044   MCV 96.3 08/11/2023 1044   MCH 31.1 08/11/2023 1044   MCHC 32.3 08/11/2023 1044   RDW 13.5 08/11/2023 1044   LYMPHSABS 2.0 03/30/2023 1557   MONOABS 0.9 03/30/2023 1557   EOSABS 0.3 03/30/2023 1557   BASOSABS 0.0 03/30/2023 1557    CMP      Component Value Date/Time   NA 136 12/02/2022 1205   K 4.5 12/02/2022 1205   CL 105 12/02/2022 1205   CO2 23 12/02/2022 1205   GLUCOSE 140 (H) 12/02/2022 1205   BUN 43 (H) 12/02/2022 1205   CREATININE 3.36 (H) 12/02/2022 1205   CALCIUM  8.3 (L) 12/02/2022 1205   CALCIUM  8.6 06/19/2022 1024   PROT 5.6 (L) 03/20/2019 0548   ALBUMIN 3.7 12/02/2022 1205   AST 22 03/20/2019 0548   ALT 42 03/20/2019 0548  ALKPHOS 55 03/20/2019 0548   BILITOT 0.8 03/20/2019 0548   GFRNONAA 17 (L) 12/02/2022 1205   GFRAA 28 (L) 03/20/2019 0548     No results found for: "CEA1", "CEA" / No results found  for: "CEA1", "CEA" No results found for: "PSA1" No results found for: "CAN199" No results found for: "CAN125"  Lab Results  Component Value Date   TOTALPROTELP 6.7 03/30/2023   ALBUMINELP 3.5 03/30/2023   A1GS 0.3 03/30/2023   A2GS 0.8 03/30/2023   BETS 1.2 03/30/2023   GAMS 0.9 03/30/2023   MSPIKE Not Observed 03/30/2023   SPEI Comment 03/30/2023   Lab Results  Component Value Date   TIBC 209 (L) 08/11/2023   TIBC 227 (L) 03/30/2023   TIBC 244 (L) 12/02/2022   FERRITIN 125 08/11/2023   FERRITIN 96 03/30/2023   FERRITIN 72 12/02/2022   IRONPCTSAT 34 08/11/2023   IRONPCTSAT 23 03/30/2023   IRONPCTSAT 39 12/02/2022   Lab Results  Component Value Date   LDH 150 03/30/2023   LDH 459 (H) 03/11/2019     STUDIES:   No results found.

## 2023-08-11 ENCOUNTER — Inpatient Hospital Stay

## 2023-08-11 ENCOUNTER — Encounter: Payer: Self-pay | Admitting: Hematology

## 2023-08-11 ENCOUNTER — Inpatient Hospital Stay: Attending: Hematology

## 2023-08-11 ENCOUNTER — Inpatient Hospital Stay: Admitting: Hematology

## 2023-08-11 VITALS — BP 172/81 | HR 57 | Temp 97.7°F | Resp 17 | Ht 71.0 in | Wt 168.2 lb

## 2023-08-11 DIAGNOSIS — Z7902 Long term (current) use of antithrombotics/antiplatelets: Secondary | ICD-10-CM | POA: Diagnosis not present

## 2023-08-11 DIAGNOSIS — R5383 Other fatigue: Secondary | ICD-10-CM | POA: Insufficient documentation

## 2023-08-11 DIAGNOSIS — Z923 Personal history of irradiation: Secondary | ICD-10-CM | POA: Diagnosis not present

## 2023-08-11 DIAGNOSIS — D509 Iron deficiency anemia, unspecified: Secondary | ICD-10-CM | POA: Insufficient documentation

## 2023-08-11 DIAGNOSIS — Z7982 Long term (current) use of aspirin: Secondary | ICD-10-CM | POA: Diagnosis not present

## 2023-08-11 DIAGNOSIS — N184 Chronic kidney disease, stage 4 (severe): Secondary | ICD-10-CM | POA: Insufficient documentation

## 2023-08-11 DIAGNOSIS — Z79899 Other long term (current) drug therapy: Secondary | ICD-10-CM | POA: Diagnosis not present

## 2023-08-11 DIAGNOSIS — D631 Anemia in chronic kidney disease: Secondary | ICD-10-CM | POA: Insufficient documentation

## 2023-08-11 DIAGNOSIS — E538 Deficiency of other specified B group vitamins: Secondary | ICD-10-CM | POA: Insufficient documentation

## 2023-08-11 DIAGNOSIS — Z87891 Personal history of nicotine dependence: Secondary | ICD-10-CM | POA: Diagnosis not present

## 2023-08-11 LAB — CBC
HCT: 28.8 % — ABNORMAL LOW (ref 39.0–52.0)
Hemoglobin: 9.3 g/dL — ABNORMAL LOW (ref 13.0–17.0)
MCH: 31.1 pg (ref 26.0–34.0)
MCHC: 32.3 g/dL (ref 30.0–36.0)
MCV: 96.3 fL (ref 80.0–100.0)
Platelets: 328 10*3/uL (ref 150–400)
RBC: 2.99 MIL/uL — ABNORMAL LOW (ref 4.22–5.81)
RDW: 13.5 % (ref 11.5–15.5)
WBC: 8.1 10*3/uL (ref 4.0–10.5)
nRBC: 0 % (ref 0.0–0.2)

## 2023-08-11 LAB — IRON AND TIBC
Iron: 72 ug/dL (ref 45–182)
Saturation Ratios: 34 % (ref 17.9–39.5)
TIBC: 209 ug/dL — ABNORMAL LOW (ref 250–450)
UIBC: 137 ug/dL

## 2023-08-11 LAB — FERRITIN: Ferritin: 125 ng/mL (ref 24–336)

## 2023-08-11 MED ORDER — EPOETIN ALFA-EPBX 10000 UNIT/ML IJ SOLN
10000.0000 [IU] | Freq: Once | INTRAMUSCULAR | Status: AC
  Administered 2023-08-11: 10000 [IU] via SUBCUTANEOUS
  Filled 2023-08-11: qty 1

## 2023-08-11 NOTE — Progress Notes (Unsigned)
 Hemoglobin today is 9.3.  We will give Retacrit  per provider orders.

## 2023-08-11 NOTE — Patient Instructions (Addendum)
 [facility] at [facility] Discharge Instructions   You were seen and examined today by Dr. Cheree Cords.  He reviewed the results of your lab work which are normal/stable.   We will start you back on Retacrit  shots once a month to boost your hemoglobin. Your hemoglobin improved to 9.3 today  We will see you back in 3 months.   Return as scheduled.    Thank you for choosing [facility] at [facility] to provide your oncology and hematology care.  To afford each patient quality time with our provider, please arrive at least 15 minutes before your scheduled appointment time.   If you have a lab appointment with the Cancer Center please come in thru the Main Entrance and check in at the main information desk.  You need to re-schedule your appointment should you arrive 10 or more minutes late.  We strive to give you quality time with our providers, and arriving late affects you and other patients whose appointments are after yours.  Also, if you no show three or more times for appointments you may be dismissed from the clinic at the providers discretion.     Again, thank you for choosing [facility].  Our hope is that these requests will decrease the amount of time that you wait before being seen by our physicians.       _____________________________________________________________  Should you have questions after your visit to [facility], please contact our office at [phone] and follow the prompts.  Our office hours are 8:00 a.m. and 4:30 p.m. Monday - Friday.  Please note that voicemails left after 4:00 p.m. may not be returned until the following business day.  We are closed weekends and major holidays.  You do have access to a nurse 24-7, just call the main number to the clinic [phone] and do not press any options, hold on the line and a nurse will answer the phone.    For prescription refill requests, have your  pharmacy contact our office and allow 72 hours.    Due to Covid, you will need to wear a mask upon entering the hospital. If you do not have a mask, a mask will be given to you at the Main Entrance upon arrival. For doctor visits, patients may have 1 support person age 75 or older with them. For treatment visits, patients can not have anyone with them due to social distancing guidelines and our immunocompromised population.

## 2023-08-11 NOTE — Patient Instructions (Signed)
 [facility] [place] - A DEPT OF [facility]  Discharge Instructions: Thank you for choosing [facility] to provide your oncology and hematology care.  If you have a lab appointment with the Cancer Center - please note that after April 8th, 2024, all labs will be drawn in the cancer center.  You do not have to check in or register with the main entrance as you have in the past but will complete your check-in in the cancer center.  Wear comfortable clothing and clothing appropriate for easy access to any Portacath or PICC line.   We strive to give you quality time with your provider. You may need to reschedule your appointment if you arrive late (15 or more minutes).  Arriving late affects you and other patients whose appointments are after yours.  Also, if you miss three or more appointments without notifying the office, you may be dismissed from the clinic at the provider's discretion.      For prescription refill requests, have your pharmacy contact our office and allow 72 hours for refills to be completed.    Today you received the following :  Retacrit.  Epoetin Alfa Injection What is this medication? EPOETIN ALFA (e POE e tin AL fa) treats low levels of red blood cells (anemia) caused by kidney disease, chemotherapy, or HIV medications. It can also be used in people who are at risk for blood loss during surgery. It works by Systems analyst make more red blood cells, which reduces the need for blood transfusions. This medicine may be used for other purposes; ask your health care provider or pharmacist if you have questions. COMMON BRAND NAME(S): Epogen, Procrit, Retacrit What should I tell my care team before I take this medication? They need to know if you have any of these conditions: Blood clots Cancer Heart disease High blood pressure On dialysis Seizures Stroke An unusual or allergic reaction to epoetin alfa, albumin, benzyl alcohol, other  medications, foods, dyes, or preservatives Pregnant or trying to get pregnant Breast-feeding How should I use this medication? This medication is injected into a vein or under the skin. It is usually given by your care team in a hospital or clinic setting. It may also be given at home. If you get this medication at home, you will be taught how to prepare and give it. Use exactly as directed. Take it as directed on the prescription label at the same time every day. Keep taking it unless your care team tells you to stop. It is important that you put your used needles and syringes in a special sharps container. Do not put them in a trash can. If you do not have a sharps container, call your pharmacist or care team to get one. A special MedGuide will be given to you by the pharmacist with each prescription and refill. Be sure to read this information carefully each time. Talk to your care team about the use of this medication in children. While this medication may be used in children as young as 1 month of age for selected conditions, precautions do apply. Overdosage: If you think you have taken too much of this medicine contact a poison control center or emergency room at once. NOTE: This medicine is only for you. Do not share this medicine with others. What if I miss a dose? If you miss a dose, take it as soon as you can. If it is almost time for your  next dose, take only that dose. Do not take double or extra doses. What may interact with this medication? Darbepoetin alfa Methoxy polyethylene glycol-epoetin beta This list may not describe all possible interactions. Give your health care provider a list of all the medicines, herbs, non-prescription drugs, or dietary supplements you use. Also tell them if you smoke, drink alcohol, or use illegal drugs. Some items may interact with your medicine. What should I watch for while using this medication? Visit your care team for regular checks on your  progress. Check your blood pressure as directed. Know what your blood pressure should be and when to contact your care team. Your condition will be monitored carefully while you are receiving this medication. You may need blood work while taking this medication. What side effects may I notice from receiving this medication? Side effects that you should report to your care team as soon as possible: Allergic reactions--skin rash, itching, hives, swelling of the face, lips, tongue, or throat Blood clot--pain, swelling, or warmth in the leg, shortness of breath, chest pain Heart attack--pain or tightness in the chest, shoulders, arms, or jaw, nausea, shortness of breath, cold or clammy skin, feeling faint or lightheaded Increase in blood pressure Rash, fever, and swollen lymph nodes Redness, blistering, peeling, or loosening of the skin, including inside the mouth Seizures Stroke--sudden numbness or weakness of the face, arm, or leg, trouble speaking, confusion, trouble walking, loss of balance or coordination, dizziness, severe headache, change in vision Side effects that usually do not require medical attention (report to your care team if they continue or are bothersome): Bone, joint, or muscle pain Cough Headache Nausea Pain, redness, or irritation at injection site This list may not describe all possible side effects. Call your doctor for medical advice about side effects. You may report side effects to FDA at 1-800-FDA-1088. Where should I keep my medication? Keep out of the reach of children and pets. Store in a refrigerator. Do not freeze. Do not shake. Protect from light. Keep this medication in the original container until you are ready to take it. See product for storage information. Get rid of any unused medication after the expiration date. To get rid of medications that are no longer needed or have expired: Take the medication to a medication take-back program. Check with your  pharmacy or law enforcement to find a location. If you cannot return the medication, ask your pharmacist or care team how to get rid of the medication safely. NOTE: This sheet is a summary. It may not cover all possible information. If you have questions about this medicine, talk to your doctor, pharmacist, or health care provider.  2024 Elsevier/Gold Standard (2021-07-04 00:00:00)    To help prevent nausea and vomiting after your treatment, we encourage you to take your nausea medication as directed.  BELOW ARE SYMPTOMS THAT SHOULD BE REPORTED IMMEDIATELY: *FEVER GREATER THAN 100.4 F (38 C) OR HIGHER *CHILLS OR SWEATING *NAUSEA AND VOMITING THAT IS NOT CONTROLLED WITH YOUR NAUSEA MEDICATION *UNUSUAL SHORTNESS OF BREATH *UNUSUAL BRUISING OR BLEEDING *URINARY PROBLEMS (pain or burning when urinating, or frequent urination) *BOWEL PROBLEMS (unusual diarrhea, constipation, pain near the anus) TENDERNESS IN MOUTH AND THROAT WITH OR WITHOUT PRESENCE OF ULCERS (sore throat, sores in mouth, or a toothache) UNUSUAL RASH, SWELLING OR PAIN  UNUSUAL VAGINAL DISCHARGE OR ITCHING   Items with * indicate a potential emergency and should be followed up as soon as possible or go to the Emergency Department if any problems should  occur.  Please show the CHEMOTHERAPY ALERT CARD or IMMUNOTHERAPY ALERT CARD at check-in to the Emergency Department and triage nurse.  Should you have questions after your visit or need to cancel or reschedule your appointment, please contact [facility] [place] - A DEPT OF [facility] [phone]  and follow the prompts.  Office hours are 8:00 a.m. to 4:30 p.m. Monday - Friday. Please note that voicemails left after 4:00 p.m. may not be returned until the following business day.  We are closed weekends and major holidays. You have access to a nurse at all times for urgent questions. Please call the main number to the clinic [phone] and follow the  prompts.  For any non-urgent questions, you may also contact your provider using MyChart. We now offer e-Visits for anyone 73 and older to request care online for non-urgent symptoms. For details visit [email or web address].   Also download the MyChart app! Go to the app store, search "MyChart", open the app, select [place], and log in with your MyChart username and password.

## 2023-08-13 LAB — METHYLMALONIC ACID, SERUM: Methylmalonic Acid, Quantitative: 264 nmol/L (ref 0–378)

## 2023-09-01 ENCOUNTER — Encounter: Payer: Self-pay | Admitting: Hematology

## 2023-09-07 ENCOUNTER — Other Ambulatory Visit: Payer: Self-pay

## 2023-09-07 DIAGNOSIS — D509 Iron deficiency anemia, unspecified: Secondary | ICD-10-CM

## 2023-09-08 ENCOUNTER — Inpatient Hospital Stay

## 2023-09-08 ENCOUNTER — Inpatient Hospital Stay: Attending: Hematology

## 2023-09-08 VITALS — BP 156/66 | HR 57 | Temp 97.3°F | Resp 18 | Wt 166.2 lb

## 2023-09-08 DIAGNOSIS — Z87891 Personal history of nicotine dependence: Secondary | ICD-10-CM | POA: Insufficient documentation

## 2023-09-08 DIAGNOSIS — E538 Deficiency of other specified B group vitamins: Secondary | ICD-10-CM | POA: Diagnosis not present

## 2023-09-08 DIAGNOSIS — D631 Anemia in chronic kidney disease: Secondary | ICD-10-CM | POA: Insufficient documentation

## 2023-09-08 DIAGNOSIS — Z79899 Other long term (current) drug therapy: Secondary | ICD-10-CM | POA: Diagnosis not present

## 2023-09-08 DIAGNOSIS — R5383 Other fatigue: Secondary | ICD-10-CM | POA: Diagnosis not present

## 2023-09-08 DIAGNOSIS — N184 Chronic kidney disease, stage 4 (severe): Secondary | ICD-10-CM | POA: Insufficient documentation

## 2023-09-08 DIAGNOSIS — D509 Iron deficiency anemia, unspecified: Secondary | ICD-10-CM

## 2023-09-08 LAB — CBC WITH DIFFERENTIAL/PLATELET
Abs Immature Granulocytes: 0.01 10*3/uL (ref 0.00–0.07)
Basophils Absolute: 0.1 10*3/uL (ref 0.0–0.1)
Basophils Relative: 1 %
Eosinophils Absolute: 0.3 10*3/uL (ref 0.0–0.5)
Eosinophils Relative: 4 %
HCT: 31.4 % — ABNORMAL LOW (ref 39.0–52.0)
Hemoglobin: 9.6 g/dL — ABNORMAL LOW (ref 13.0–17.0)
Immature Granulocytes: 0 %
Lymphocytes Relative: 42 %
Lymphs Abs: 3.1 10*3/uL (ref 0.7–4.0)
MCH: 29.9 pg (ref 26.0–34.0)
MCHC: 30.6 g/dL (ref 30.0–36.0)
MCV: 97.8 fL (ref 80.0–100.0)
Monocytes Absolute: 0.7 10*3/uL (ref 0.1–1.0)
Monocytes Relative: 9 %
Neutro Abs: 3.2 10*3/uL (ref 1.7–7.7)
Neutrophils Relative %: 44 %
Platelets: 272 10*3/uL (ref 150–400)
RBC: 3.21 MIL/uL — ABNORMAL LOW (ref 4.22–5.81)
RDW: 13.2 % (ref 11.5–15.5)
WBC: 7.3 10*3/uL (ref 4.0–10.5)
nRBC: 0 % (ref 0.0–0.2)

## 2023-09-08 MED ORDER — METHYLPREDNISOLONE SODIUM SUCC 125 MG IJ SOLR
125.0000 mg | Freq: Once | INTRAMUSCULAR | Status: AC
  Administered 2023-09-08: 125 mg via INTRAVENOUS
  Filled 2023-09-08: qty 2

## 2023-09-08 MED ORDER — FAMOTIDINE IN NACL 20-0.9 MG/50ML-% IV SOLN
20.0000 mg | Freq: Once | INTRAVENOUS | Status: AC
  Administered 2023-09-08: 20 mg via INTRAVENOUS
  Filled 2023-09-08: qty 50

## 2023-09-08 MED ORDER — CETIRIZINE HCL 10 MG/ML IV SOLN
5.0000 mg | Freq: Once | INTRAVENOUS | Status: AC
  Administered 2023-09-08: 5 mg via INTRAVENOUS
  Filled 2023-09-08: qty 1

## 2023-09-08 MED ORDER — SODIUM CHLORIDE 0.9 % IV SOLN: Freq: Once | INTRAVENOUS | Status: AC

## 2023-09-08 MED ORDER — ACETAMINOPHEN 325 MG PO TABS
650.0000 mg | ORAL_TABLET | Freq: Once | ORAL | Status: AC
  Administered 2023-09-08: 650 mg via ORAL
  Filled 2023-09-08: qty 2

## 2023-09-08 MED ORDER — SODIUM CHLORIDE 0.9 % IV SOLN
1000.0000 mg | Freq: Once | INTRAVENOUS | Status: AC
  Administered 2023-09-08: 1000 mg via INTRAVENOUS
  Filled 2023-09-08: qty 20

## 2023-09-08 MED ORDER — EPOETIN ALFA-EPBX 10000 UNIT/ML IJ SOLN: 10000.0000 [IU] | Freq: Once | INTRAMUSCULAR | Status: DC

## 2023-09-08 NOTE — Patient Instructions (Signed)
 Iron Dextran Injection What is this medication? IRON DEXTRAN (EYE ern DEX tran) treats low levels of iron in your body. Iron is a mineral that plays an important role in making red blood cells, which carry oxygen from your lungs to the rest of your body. This medicine may be used for other purposes; ask your health care provider or pharmacist if you have questions. COMMON BRAND NAME(S): Dexferrum, INFeD What should I tell my care team before I take this medication? They need to know if you have any of these conditions: Anemia not caused by low iron levels Heart disease High levels of iron in the blood Kidney disease Liver disease An unusual or allergic reaction to iron, other medications, foods, dyes, or preservatives Pregnant or trying to get pregnant Breastfeeding How should I use this medication? This medication is injected into a vein or a muscle. It is given by your care team in a hospital or clinic setting. Talk to your care team about the use of this medication in children. While it may be prescribed for children as young as 4 months for selected conditions, precautions do apply. Overdosage: If you think you have taken too much of this medicine contact a poison control center or emergency room at once. NOTE: This medicine is only for you. Do not share this medicine with others. What if I miss a dose? Keep appointments for follow-up doses. It is important not to miss your dose. Call your care team if you are unable to keep an appointment. What may interact with this medication? Do not take this medication with any of the following: Deferoxamine Dimercaprol Other iron products This medication may also interact with the following: Chloramphenicol Deferasirox This list may not describe all possible interactions. Give your health care provider a list of all the medicines, herbs, non-prescription drugs, or dietary supplements you use. Also tell them if you smoke, drink alcohol, or use  illegal drugs. Some items may interact with your medicine. What should I watch for while using this medication? Visit your care team for regular checks on your progress. Tell your care team if your symptoms do not start to get better or if they get worse. You may need blood work while taking this medication. You may need to eat more foods that contain iron. Talk to your care team. Foods that contain iron include whole grains/cereals, dried fruits, beans, peas, leafy green vegetables, and organ meats (liver, kidney). Long-term use of this medication may increase your risk of some cancers. Talk to your care team about your risk of cancer. What side effects may I notice from receiving this medication? Side effects that you should report to your care team as soon as possible: Allergic reactions--skin rash, itching, hives, swelling of the face, lips, tongue, or throat Low blood pressure--dizziness, feeling faint or lightheaded, blurry vision Shortness of breath Side effects that usually do not require medical attention (report to your care team if they continue or are bothersome): Flushing Headache Joint pain Muscle pain Nausea Pain, redness, or irritation at injection site This list may not describe all possible side effects. Call your doctor for medical advice about side effects. You may report side effects to FDA at 1-800-FDA-1088. Where should I keep my medication? This medication is given in a hospital or clinic. It will not be stored at home. NOTE: This sheet is a summary. It may not cover all possible information. If you have questions about this medicine, talk to your doctor, pharmacist, or health  care provider.  2024 [employer] Standard (2022-10-21 00:00:00)

## 2023-09-08 NOTE — Progress Notes (Signed)
 Patient tolerated iron infusion with no complaints voiced.  Peripheral IV site clean and dry with good blood return noted before and after infusion.  Band aid applied. Pt observed for 30 minutes post iron infusion without any complications.  VSS with discharge and left in satisfactory condition with no s/s of distress noted. All follow ups as scheduled.   Walaa Carel [employer]

## 2023-09-08 NOTE — Progress Notes (Signed)
 Due to patient receiving Infed  today, pt is rescheduled to thursday to receive his Retacrit  injection. Pt and treatment team made aware. All follow ups as scheduled.   Nicola Heinemann

## 2023-09-09 ENCOUNTER — Inpatient Hospital Stay

## 2023-09-09 VITALS — BP 156/60 | HR 69 | Temp 98.5°F | Resp 18

## 2023-09-09 DIAGNOSIS — D631 Anemia in chronic kidney disease: Secondary | ICD-10-CM | POA: Diagnosis not present

## 2023-09-09 DIAGNOSIS — E538 Deficiency of other specified B group vitamins: Secondary | ICD-10-CM | POA: Diagnosis not present

## 2023-09-09 DIAGNOSIS — R5383 Other fatigue: Secondary | ICD-10-CM | POA: Diagnosis not present

## 2023-09-09 DIAGNOSIS — Z87891 Personal history of nicotine dependence: Secondary | ICD-10-CM | POA: Diagnosis not present

## 2023-09-09 DIAGNOSIS — Z79899 Other long term (current) drug therapy: Secondary | ICD-10-CM | POA: Diagnosis not present

## 2023-09-09 DIAGNOSIS — N184 Chronic kidney disease, stage 4 (severe): Secondary | ICD-10-CM | POA: Diagnosis not present

## 2023-09-09 DIAGNOSIS — D509 Iron deficiency anemia, unspecified: Secondary | ICD-10-CM

## 2023-09-09 MED ORDER — EPOETIN ALFA-EPBX 10000 UNIT/ML IJ SOLN
10000.0000 [IU] | Freq: Once | INTRAMUSCULAR | Status: AC
  Administered 2023-09-09: 10000 [IU] via SUBCUTANEOUS
  Filled 2023-09-09: qty 1

## 2023-09-09 NOTE — Patient Instructions (Signed)
 [facility] [place] - A DEPT OF [facility]  Discharge Instructions: Thank you for choosing [facility] to provide your oncology and hematology care.  If you have a lab appointment with the Cancer Center - please note that after April 8th, 2024, all labs will be drawn in the cancer center.  You do not have to check in or register with the main entrance as you have in the past but will complete your check-in in the cancer center.  Wear comfortable clothing and clothing appropriate for easy access to any Portacath or PICC line.   We strive to give you quality time with your provider. You may need to reschedule your appointment if you arrive late (15 or more minutes).  Arriving late affects you and other patients whose appointments are after yours.  Also, if you miss three or more appointments without notifying the office, you may be dismissed from the clinic at the provider's discretion.      For prescription refill requests, have your pharmacy contact our office and allow 72 hours for refills to be completed.    Today you received the following :  Retacrit.  Epoetin Alfa Injection What is this medication? EPOETIN ALFA (e POE e tin AL fa) treats low levels of red blood cells (anemia) caused by kidney disease, chemotherapy, or HIV medications. It can also be used in people who are at risk for blood loss during surgery. It works by Systems analyst make more red blood cells, which reduces the need for blood transfusions. This medicine may be used for other purposes; ask your health care provider or pharmacist if you have questions. COMMON BRAND NAME(S): Epogen, Procrit, Retacrit What should I tell my care team before I take this medication? They need to know if you have any of these conditions: Blood clots Cancer Heart disease High blood pressure On dialysis Seizures Stroke An unusual or allergic reaction to epoetin alfa, albumin, benzyl alcohol, other  medications, foods, dyes, or preservatives Pregnant or trying to get pregnant Breast-feeding How should I use this medication? This medication is injected into a vein or under the skin. It is usually given by your care team in a hospital or clinic setting. It may also be given at home. If you get this medication at home, you will be taught how to prepare and give it. Use exactly as directed. Take it as directed on the prescription label at the same time every day. Keep taking it unless your care team tells you to stop. It is important that you put your used needles and syringes in a special sharps container. Do not put them in a trash can. If you do not have a sharps container, call your pharmacist or care team to get one. A special MedGuide will be given to you by the pharmacist with each prescription and refill. Be sure to read this information carefully each time. Talk to your care team about the use of this medication in children. While this medication may be used in children as young as 1 month of age for selected conditions, precautions do apply. Overdosage: If you think you have taken too much of this medicine contact a poison control center or emergency room at once. NOTE: This medicine is only for you. Do not share this medicine with others. What if I miss a dose? If you miss a dose, take it as soon as you can. If it is almost time for your  next dose, take only that dose. Do not take double or extra doses. What may interact with this medication? Darbepoetin alfa Methoxy polyethylene glycol-epoetin beta This list may not describe all possible interactions. Give your health care provider a list of all the medicines, herbs, non-prescription drugs, or dietary supplements you use. Also tell them if you smoke, drink alcohol, or use illegal drugs. Some items may interact with your medicine. What should I watch for while using this medication? Visit your care team for regular checks on your  progress. Check your blood pressure as directed. Know what your blood pressure should be and when to contact your care team. Your condition will be monitored carefully while you are receiving this medication. You may need blood work while taking this medication. What side effects may I notice from receiving this medication? Side effects that you should report to your care team as soon as possible: Allergic reactions--skin rash, itching, hives, swelling of the face, lips, tongue, or throat Blood clot--pain, swelling, or warmth in the leg, shortness of breath, chest pain Heart attack--pain or tightness in the chest, shoulders, arms, or jaw, nausea, shortness of breath, cold or clammy skin, feeling faint or lightheaded Increase in blood pressure Rash, fever, and swollen lymph nodes Redness, blistering, peeling, or loosening of the skin, including inside the mouth Seizures Stroke--sudden numbness or weakness of the face, arm, or leg, trouble speaking, confusion, trouble walking, loss of balance or coordination, dizziness, severe headache, change in vision Side effects that usually do not require medical attention (report to your care team if they continue or are bothersome): Bone, joint, or muscle pain Cough Headache Nausea Pain, redness, or irritation at injection site This list may not describe all possible side effects. Call your doctor for medical advice about side effects. You may report side effects to FDA at 1-800-FDA-1088. Where should I keep my medication? Keep out of the reach of children and pets. Store in a refrigerator. Do not freeze. Do not shake. Protect from light. Keep this medication in the original container until you are ready to take it. See product for storage information. Get rid of any unused medication after the expiration date. To get rid of medications that are no longer needed or have expired: Take the medication to a medication take-back program. Check with your  pharmacy or law enforcement to find a location. If you cannot return the medication, ask your pharmacist or care team how to get rid of the medication safely. NOTE: This sheet is a summary. It may not cover all possible information. If you have questions about this medicine, talk to your doctor, pharmacist, or health care provider.  2024 Elsevier/Gold Standard (2021-07-04 00:00:00)    To help prevent nausea and vomiting after your treatment, we encourage you to take your nausea medication as directed.  BELOW ARE SYMPTOMS THAT SHOULD BE REPORTED IMMEDIATELY: *FEVER GREATER THAN 100.4 F (38 C) OR HIGHER *CHILLS OR SWEATING *NAUSEA AND VOMITING THAT IS NOT CONTROLLED WITH YOUR NAUSEA MEDICATION *UNUSUAL SHORTNESS OF BREATH *UNUSUAL BRUISING OR BLEEDING *URINARY PROBLEMS (pain or burning when urinating, or frequent urination) *BOWEL PROBLEMS (unusual diarrhea, constipation, pain near the anus) TENDERNESS IN MOUTH AND THROAT WITH OR WITHOUT PRESENCE OF ULCERS (sore throat, sores in mouth, or a toothache) UNUSUAL RASH, SWELLING OR PAIN  UNUSUAL VAGINAL DISCHARGE OR ITCHING   Items with * indicate a potential emergency and should be followed up as soon as possible or go to the Emergency Department if any problems should  occur.  Please show the CHEMOTHERAPY ALERT CARD or IMMUNOTHERAPY ALERT CARD at check-in to the Emergency Department and triage nurse.  Should you have questions after your visit or need to cancel or reschedule your appointment, please contact [facility] [place] - A DEPT OF [facility] [phone]  and follow the prompts.  Office hours are 8:00 a.m. to 4:30 p.m. Monday - Friday. Please note that voicemails left after 4:00 p.m. may not be returned until the following business day.  We are closed weekends and major holidays. You have access to a nurse at all times for urgent questions. Please call the main number to the clinic [phone] and follow the  prompts.  For any non-urgent questions, you may also contact your provider using MyChart. We now offer e-Visits for anyone 73 and older to request care online for non-urgent symptoms. For details visit [email or web address].   Also download the MyChart app! Go to the app store, search "MyChart", open the app, select [place], and log in with your MyChart username and password.

## 2023-09-09 NOTE — Progress Notes (Signed)
 Hemoglobin on 09/08/23 was 9.6.  We will proceed with Retacrit  injection per provider orders.   Patient tolerated injection with no complaints voiced.  Site clean and dry with no bruising or swelling noted.  No complaints of pain.  Discharged with vital signs stable and no signs or symptoms of distress noted.

## 2023-09-28 DIAGNOSIS — E211 Secondary hyperparathyroidism, not elsewhere classified: Secondary | ICD-10-CM | POA: Diagnosis not present

## 2023-09-28 DIAGNOSIS — R809 Proteinuria, unspecified: Secondary | ICD-10-CM | POA: Diagnosis not present

## 2023-09-28 DIAGNOSIS — D631 Anemia in chronic kidney disease: Secondary | ICD-10-CM | POA: Diagnosis not present

## 2023-09-28 DIAGNOSIS — N189 Chronic kidney disease, unspecified: Secondary | ICD-10-CM | POA: Diagnosis not present

## 2023-10-05 ENCOUNTER — Other Ambulatory Visit: Payer: Self-pay

## 2023-10-05 DIAGNOSIS — E1129 Type 2 diabetes mellitus with other diabetic kidney complication: Secondary | ICD-10-CM | POA: Diagnosis not present

## 2023-10-05 DIAGNOSIS — E8722 Chronic metabolic acidosis: Secondary | ICD-10-CM | POA: Diagnosis not present

## 2023-10-05 DIAGNOSIS — N184 Chronic kidney disease, stage 4 (severe): Secondary | ICD-10-CM | POA: Diagnosis not present

## 2023-10-05 DIAGNOSIS — R809 Proteinuria, unspecified: Secondary | ICD-10-CM | POA: Diagnosis not present

## 2023-10-05 DIAGNOSIS — D509 Iron deficiency anemia, unspecified: Secondary | ICD-10-CM

## 2023-10-06 ENCOUNTER — Inpatient Hospital Stay

## 2023-10-06 ENCOUNTER — Inpatient Hospital Stay: Attending: Hematology

## 2023-11-05 ENCOUNTER — Inpatient Hospital Stay: Attending: Hematology | Admitting: Oncology

## 2023-11-05 ENCOUNTER — Inpatient Hospital Stay

## 2023-12-07 DIAGNOSIS — R809 Proteinuria, unspecified: Secondary | ICD-10-CM | POA: Diagnosis not present

## 2023-12-07 DIAGNOSIS — I10 Essential (primary) hypertension: Secondary | ICD-10-CM | POA: Diagnosis not present

## 2023-12-07 DIAGNOSIS — E559 Vitamin D deficiency, unspecified: Secondary | ICD-10-CM | POA: Diagnosis not present

## 2023-12-07 DIAGNOSIS — E211 Secondary hyperparathyroidism, not elsewhere classified: Secondary | ICD-10-CM | POA: Diagnosis not present

## 2023-12-07 DIAGNOSIS — N189 Chronic kidney disease, unspecified: Secondary | ICD-10-CM | POA: Diagnosis not present

## 2023-12-13 DIAGNOSIS — N185 Chronic kidney disease, stage 5: Secondary | ICD-10-CM | POA: Diagnosis not present

## 2023-12-13 DIAGNOSIS — E8722 Chronic metabolic acidosis: Secondary | ICD-10-CM | POA: Diagnosis not present

## 2023-12-13 DIAGNOSIS — E1129 Type 2 diabetes mellitus with other diabetic kidney complication: Secondary | ICD-10-CM | POA: Diagnosis not present

## 2023-12-13 DIAGNOSIS — R809 Proteinuria, unspecified: Secondary | ICD-10-CM | POA: Diagnosis not present

## 2024-01-04 ENCOUNTER — Encounter: Payer: Self-pay | Admitting: Oncology
